# Patient Record
Sex: Female | Born: 1965
Health system: Southern US, Community
[De-identification: ages and names within clinical notes are randomized; demographics above are authoritative.]

## PROBLEM LIST (undated history)

## (undated) DIAGNOSIS — M542 Cervicalgia: Secondary | ICD-10-CM

## (undated) DIAGNOSIS — F329 Major depressive disorder, single episode, unspecified: Secondary | ICD-10-CM

## (undated) DIAGNOSIS — K589 Irritable bowel syndrome without diarrhea: Secondary | ICD-10-CM

## (undated) DIAGNOSIS — M797 Fibromyalgia: Secondary | ICD-10-CM

## (undated) DIAGNOSIS — E7800 Pure hypercholesterolemia, unspecified: Secondary | ICD-10-CM

## (undated) DIAGNOSIS — T8859XA Other complications of anesthesia, initial encounter: Secondary | ICD-10-CM

## (undated) DIAGNOSIS — G4700 Insomnia, unspecified: Secondary | ICD-10-CM

## (undated) DIAGNOSIS — G43909 Migraine, unspecified, not intractable, without status migrainosus: Secondary | ICD-10-CM

## (undated) DIAGNOSIS — F4310 Post-traumatic stress disorder, unspecified: Secondary | ICD-10-CM

## (undated) DIAGNOSIS — I10 Essential (primary) hypertension: Secondary | ICD-10-CM

## (undated) DIAGNOSIS — K219 Gastro-esophageal reflux disease without esophagitis: Secondary | ICD-10-CM

## (undated) DIAGNOSIS — M545 Low back pain, unspecified: Secondary | ICD-10-CM

## (undated) DIAGNOSIS — IMO0002 Reserved for concepts with insufficient information to code with codable children: Secondary | ICD-10-CM

## (undated) DIAGNOSIS — F40240 Claustrophobia: Secondary | ICD-10-CM

## (undated) DIAGNOSIS — Z9889 Other specified postprocedural states: Secondary | ICD-10-CM

## (undated) DIAGNOSIS — R112 Nausea with vomiting, unspecified: Secondary | ICD-10-CM

## (undated) DIAGNOSIS — R1011 Right upper quadrant pain: Secondary | ICD-10-CM

## (undated) DIAGNOSIS — F32A Depression, unspecified: Secondary | ICD-10-CM

## (undated) DIAGNOSIS — E049 Nontoxic goiter, unspecified: Secondary | ICD-10-CM

## (undated) DIAGNOSIS — T4145XA Adverse effect of unspecified anesthetic, initial encounter: Secondary | ICD-10-CM

## (undated) DIAGNOSIS — M1990 Unspecified osteoarthritis, unspecified site: Secondary | ICD-10-CM

## (undated) HISTORY — DX: Right upper quadrant pain: R10.11

## (undated) HISTORY — PX: ANKLE SURGERY: SHX546

## (undated) HISTORY — DX: Claustrophobia: F40.240

## (undated) HISTORY — PX: SHOULDER SURGERY: SHX246

## (undated) HISTORY — DX: Gastro-esophageal reflux disease without esophagitis: K21.9

## (undated) HISTORY — DX: Cervicalgia: M54.2

## (undated) HISTORY — DX: Irritable bowel syndrome without diarrhea: K58.9

## (undated) HISTORY — DX: Low back pain: M54.5

## (undated) HISTORY — DX: Reserved for concepts with insufficient information to code with codable children: IMO0002

## (undated) HISTORY — DX: Post-traumatic stress disorder, unspecified: F43.10

## (undated) HISTORY — DX: Major depressive disorder, single episode, unspecified: F32.9

## (undated) HISTORY — DX: Migraine, unspecified, not intractable, without status migrainosus: G43.909

## (undated) HISTORY — PX: TOTAL ABDOMINAL HYSTERECTOMY: SHX209

## (undated) HISTORY — PX: COLONOSCOPY: SHX174

## (undated) HISTORY — PX: REDUCTION MAMMAPLASTY: SUR839

## (undated) HISTORY — DX: Pure hypercholesterolemia, unspecified: E78.00

## (undated) HISTORY — DX: Insomnia, unspecified: G47.00

## (undated) HISTORY — DX: Depression, unspecified: F32.A

## (undated) HISTORY — PX: UPPER GASTROINTESTINAL ENDOSCOPY: SHX188

## (undated) HISTORY — DX: Nontoxic goiter, unspecified: E04.9

## (undated) HISTORY — DX: Low back pain, unspecified: M54.50

---

## 2000-11-19 DIAGNOSIS — K219 Gastro-esophageal reflux disease without esophagitis: Secondary | ICD-10-CM

## 2000-11-19 HISTORY — DX: Gastro-esophageal reflux disease without esophagitis: K21.9

## 2001-06-20 ENCOUNTER — Encounter: Payer: Self-pay | Admitting: Orthopedic Surgery

## 2001-06-20 ENCOUNTER — Ambulatory Visit (HOSPITAL_COMMUNITY): Admission: RE | Admit: 2001-06-20 | Discharge: 2001-06-20 | Payer: Self-pay | Admitting: Orthopedic Surgery

## 2002-02-18 ENCOUNTER — Encounter: Payer: Self-pay | Admitting: *Deleted

## 2002-02-18 ENCOUNTER — Emergency Department (HOSPITAL_COMMUNITY): Admission: EM | Admit: 2002-02-18 | Discharge: 2002-02-18 | Payer: Self-pay | Admitting: *Deleted

## 2002-04-29 ENCOUNTER — Ambulatory Visit (HOSPITAL_COMMUNITY): Admission: RE | Admit: 2002-04-29 | Discharge: 2002-04-29 | Payer: Self-pay | Admitting: Endocrinology

## 2002-10-23 ENCOUNTER — Encounter: Payer: Self-pay | Admitting: Orthopedic Surgery

## 2002-10-23 ENCOUNTER — Encounter: Admission: RE | Admit: 2002-10-23 | Discharge: 2002-10-23 | Payer: Self-pay | Admitting: Orthopedic Surgery

## 2003-11-04 ENCOUNTER — Encounter: Payer: Self-pay | Admitting: Orthopedic Surgery

## 2003-11-20 HISTORY — PX: LUMBAR DISC SURGERY: SHX700

## 2004-01-18 ENCOUNTER — Encounter: Payer: Self-pay | Admitting: Orthopedic Surgery

## 2004-01-18 ENCOUNTER — Ambulatory Visit (HOSPITAL_COMMUNITY): Admission: RE | Admit: 2004-01-18 | Discharge: 2004-01-18 | Payer: Self-pay | Admitting: Orthopedic Surgery

## 2004-04-24 ENCOUNTER — Encounter: Admission: RE | Admit: 2004-04-24 | Discharge: 2004-04-24 | Payer: Self-pay | Admitting: Orthopedic Surgery

## 2004-05-08 ENCOUNTER — Encounter: Admission: RE | Admit: 2004-05-08 | Discharge: 2004-05-08 | Payer: Self-pay | Admitting: Orthopedic Surgery

## 2004-06-07 ENCOUNTER — Encounter: Admission: RE | Admit: 2004-06-07 | Discharge: 2004-06-07 | Payer: Self-pay | Admitting: Orthopedic Surgery

## 2004-06-21 ENCOUNTER — Encounter: Admission: RE | Admit: 2004-06-21 | Discharge: 2004-06-21 | Payer: Self-pay | Admitting: Neurosurgery

## 2004-07-17 ENCOUNTER — Ambulatory Visit (HOSPITAL_COMMUNITY): Admission: RE | Admit: 2004-07-17 | Discharge: 2004-07-18 | Payer: Self-pay | Admitting: Neurosurgery

## 2004-09-15 ENCOUNTER — Encounter: Admission: RE | Admit: 2004-09-15 | Discharge: 2004-09-15 | Payer: Self-pay | Admitting: Neurosurgery

## 2004-11-19 HISTORY — PX: BREAST REDUCTION SURGERY: SHX8

## 2004-11-23 ENCOUNTER — Ambulatory Visit: Payer: Self-pay | Admitting: Cardiology

## 2004-12-25 ENCOUNTER — Encounter: Admission: RE | Admit: 2004-12-25 | Discharge: 2004-12-25 | Payer: Self-pay | Admitting: Neurosurgery

## 2005-01-10 ENCOUNTER — Ambulatory Visit: Payer: Self-pay | Admitting: Orthopedic Surgery

## 2005-02-21 ENCOUNTER — Ambulatory Visit: Payer: Self-pay | Admitting: Orthopedic Surgery

## 2005-02-28 ENCOUNTER — Ambulatory Visit: Payer: Self-pay | Admitting: Cardiology

## 2005-03-14 ENCOUNTER — Ambulatory Visit: Payer: Self-pay | Admitting: Cardiology

## 2005-04-06 ENCOUNTER — Ambulatory Visit: Payer: Self-pay | Admitting: Cardiology

## 2005-05-28 ENCOUNTER — Ambulatory Visit: Payer: Self-pay | Admitting: Orthopedic Surgery

## 2005-06-01 ENCOUNTER — Ambulatory Visit: Payer: Self-pay | Admitting: Orthopedic Surgery

## 2005-06-01 ENCOUNTER — Ambulatory Visit (HOSPITAL_COMMUNITY): Admission: RE | Admit: 2005-06-01 | Discharge: 2005-06-01 | Payer: Self-pay | Admitting: Orthopedic Surgery

## 2005-06-04 ENCOUNTER — Ambulatory Visit: Payer: Self-pay | Admitting: Orthopedic Surgery

## 2005-06-11 ENCOUNTER — Ambulatory Visit: Payer: Self-pay | Admitting: Orthopedic Surgery

## 2005-06-25 ENCOUNTER — Ambulatory Visit: Payer: Self-pay | Admitting: Orthopedic Surgery

## 2005-07-18 ENCOUNTER — Ambulatory Visit: Payer: Self-pay | Admitting: Cardiology

## 2005-07-30 ENCOUNTER — Ambulatory Visit: Payer: Self-pay | Admitting: Orthopedic Surgery

## 2005-09-03 ENCOUNTER — Ambulatory Visit: Payer: Self-pay | Admitting: Orthopedic Surgery

## 2005-11-01 ENCOUNTER — Ambulatory Visit: Payer: Self-pay | Admitting: Psychology

## 2005-12-12 ENCOUNTER — Ambulatory Visit: Payer: Self-pay | Admitting: Orthopedic Surgery

## 2005-12-14 ENCOUNTER — Ambulatory Visit: Payer: Self-pay | Admitting: Psychology

## 2006-03-14 ENCOUNTER — Ambulatory Visit: Payer: Self-pay | Admitting: Orthopedic Surgery

## 2006-04-26 ENCOUNTER — Ambulatory Visit (HOSPITAL_COMMUNITY): Payer: Self-pay | Admitting: Psychology

## 2006-05-10 ENCOUNTER — Ambulatory Visit (HOSPITAL_COMMUNITY): Payer: Self-pay | Admitting: Psychology

## 2006-06-13 ENCOUNTER — Ambulatory Visit: Payer: Self-pay | Admitting: Orthopedic Surgery

## 2006-06-26 ENCOUNTER — Ambulatory Visit (HOSPITAL_COMMUNITY): Payer: Self-pay | Admitting: Psychology

## 2006-07-15 ENCOUNTER — Ambulatory Visit (HOSPITAL_COMMUNITY): Payer: Self-pay | Admitting: Psychology

## 2006-07-18 ENCOUNTER — Ambulatory Visit (HOSPITAL_COMMUNITY): Payer: Self-pay | Admitting: Psychology

## 2006-08-02 ENCOUNTER — Ambulatory Visit (HOSPITAL_COMMUNITY): Payer: Self-pay | Admitting: Psychology

## 2006-08-28 ENCOUNTER — Ambulatory Visit (HOSPITAL_COMMUNITY): Payer: Self-pay | Admitting: Psychology

## 2006-09-18 ENCOUNTER — Ambulatory Visit (HOSPITAL_COMMUNITY): Payer: Self-pay | Admitting: Psychology

## 2006-10-08 ENCOUNTER — Ambulatory Visit (HOSPITAL_COMMUNITY): Payer: Self-pay | Admitting: Psychology

## 2006-11-07 ENCOUNTER — Ambulatory Visit (HOSPITAL_COMMUNITY): Payer: Self-pay | Admitting: Psychology

## 2006-11-19 DIAGNOSIS — K589 Irritable bowel syndrome without diarrhea: Secondary | ICD-10-CM

## 2006-11-19 HISTORY — DX: Irritable bowel syndrome, unspecified: K58.9

## 2006-11-25 ENCOUNTER — Ambulatory Visit: Payer: Self-pay | Admitting: Orthopedic Surgery

## 2006-12-20 ENCOUNTER — Ambulatory Visit (HOSPITAL_COMMUNITY): Payer: Self-pay | Admitting: Psychology

## 2007-01-09 ENCOUNTER — Ambulatory Visit (HOSPITAL_COMMUNITY): Payer: Self-pay | Admitting: Psychology

## 2007-01-28 ENCOUNTER — Ambulatory Visit (HOSPITAL_COMMUNITY): Payer: Self-pay | Admitting: Psychology

## 2007-02-20 ENCOUNTER — Ambulatory Visit (HOSPITAL_COMMUNITY): Payer: Self-pay | Admitting: Psychology

## 2007-02-27 ENCOUNTER — Ambulatory Visit: Payer: Self-pay | Admitting: Orthopedic Surgery

## 2007-03-21 ENCOUNTER — Ambulatory Visit (HOSPITAL_COMMUNITY): Payer: Self-pay | Admitting: Psychology

## 2007-03-24 ENCOUNTER — Ambulatory Visit: Payer: Self-pay | Admitting: Cardiology

## 2007-03-31 ENCOUNTER — Ambulatory Visit: Payer: Self-pay | Admitting: Internal Medicine

## 2007-04-04 ENCOUNTER — Ambulatory Visit: Payer: Self-pay | Admitting: Internal Medicine

## 2007-04-04 ENCOUNTER — Ambulatory Visit (HOSPITAL_COMMUNITY): Admission: RE | Admit: 2007-04-04 | Discharge: 2007-04-04 | Payer: Self-pay | Admitting: Internal Medicine

## 2007-04-10 ENCOUNTER — Ambulatory Visit (HOSPITAL_COMMUNITY): Payer: Self-pay | Admitting: Psychology

## 2007-04-22 ENCOUNTER — Ambulatory Visit: Payer: Self-pay | Admitting: Cardiology

## 2007-04-29 ENCOUNTER — Ambulatory Visit (HOSPITAL_COMMUNITY): Payer: Self-pay | Admitting: Psychology

## 2007-05-01 ENCOUNTER — Ambulatory Visit: Payer: Self-pay | Admitting: Orthopedic Surgery

## 2007-05-08 ENCOUNTER — Ambulatory Visit: Payer: Self-pay | Admitting: Cardiology

## 2007-05-21 ENCOUNTER — Ambulatory Visit: Payer: Self-pay | Admitting: Gastroenterology

## 2007-05-21 ENCOUNTER — Ambulatory Visit (HOSPITAL_COMMUNITY): Payer: Self-pay | Admitting: Psychology

## 2007-06-05 ENCOUNTER — Ambulatory Visit: Payer: Self-pay | Admitting: Internal Medicine

## 2007-06-06 ENCOUNTER — Ambulatory Visit (HOSPITAL_COMMUNITY): Payer: Self-pay | Admitting: Psychology

## 2007-06-16 ENCOUNTER — Ambulatory Visit: Payer: Self-pay | Admitting: Internal Medicine

## 2007-07-01 ENCOUNTER — Ambulatory Visit: Payer: Self-pay | Admitting: Urgent Care

## 2007-07-07 ENCOUNTER — Ambulatory Visit (HOSPITAL_COMMUNITY): Payer: Self-pay | Admitting: Psychology

## 2007-07-08 ENCOUNTER — Encounter (HOSPITAL_COMMUNITY): Admission: RE | Admit: 2007-07-08 | Discharge: 2007-08-07 | Payer: Self-pay | Admitting: Gastroenterology

## 2007-07-24 ENCOUNTER — Ambulatory Visit (HOSPITAL_COMMUNITY): Payer: Self-pay | Admitting: Psychology

## 2007-08-01 ENCOUNTER — Ambulatory Visit: Payer: Self-pay | Admitting: Internal Medicine

## 2007-08-07 ENCOUNTER — Ambulatory Visit (HOSPITAL_COMMUNITY): Payer: Self-pay | Admitting: Psychology

## 2007-08-14 ENCOUNTER — Ambulatory Visit: Payer: Self-pay | Admitting: Orthopedic Surgery

## 2007-09-05 ENCOUNTER — Ambulatory Visit (HOSPITAL_COMMUNITY): Payer: Self-pay | Admitting: Psychology

## 2007-11-06 ENCOUNTER — Ambulatory Visit: Payer: Self-pay | Admitting: Internal Medicine

## 2007-11-06 ENCOUNTER — Ambulatory Visit (HOSPITAL_COMMUNITY): Payer: Self-pay | Admitting: Psychology

## 2007-11-27 ENCOUNTER — Ambulatory Visit (HOSPITAL_COMMUNITY): Payer: Self-pay | Admitting: Psychology

## 2007-12-10 ENCOUNTER — Ambulatory Visit (HOSPITAL_COMMUNITY): Payer: Self-pay | Admitting: Psychology

## 2007-12-17 ENCOUNTER — Ambulatory Visit (HOSPITAL_COMMUNITY): Payer: Self-pay | Admitting: Psychology

## 2007-12-26 ENCOUNTER — Ambulatory Visit (HOSPITAL_COMMUNITY): Payer: Self-pay | Admitting: Psychology

## 2008-01-05 ENCOUNTER — Ambulatory Visit: Payer: Self-pay | Admitting: Orthopedic Surgery

## 2008-01-05 DIAGNOSIS — M25569 Pain in unspecified knee: Secondary | ICD-10-CM | POA: Insufficient documentation

## 2008-01-05 DIAGNOSIS — M549 Dorsalgia, unspecified: Secondary | ICD-10-CM | POA: Insufficient documentation

## 2008-01-16 ENCOUNTER — Ambulatory Visit (HOSPITAL_COMMUNITY): Payer: Self-pay | Admitting: Psychology

## 2008-02-12 ENCOUNTER — Ambulatory Visit (HOSPITAL_COMMUNITY): Payer: Self-pay | Admitting: Psychology

## 2008-02-19 ENCOUNTER — Telehealth: Payer: Self-pay | Admitting: Orthopedic Surgery

## 2008-02-25 ENCOUNTER — Ambulatory Visit (HOSPITAL_COMMUNITY): Payer: Self-pay | Admitting: Psychology

## 2008-03-12 ENCOUNTER — Ambulatory Visit (HOSPITAL_COMMUNITY): Payer: Self-pay | Admitting: Psychology

## 2008-03-23 ENCOUNTER — Ambulatory Visit (HOSPITAL_COMMUNITY): Payer: Self-pay | Admitting: Psychology

## 2008-04-01 ENCOUNTER — Ambulatory Visit: Payer: Self-pay | Admitting: Orthopedic Surgery

## 2008-04-01 DIAGNOSIS — M24469 Recurrent dislocation, unspecified knee: Secondary | ICD-10-CM | POA: Insufficient documentation

## 2008-04-06 ENCOUNTER — Ambulatory Visit (HOSPITAL_COMMUNITY): Payer: Self-pay | Admitting: Psychology

## 2008-04-07 ENCOUNTER — Encounter: Payer: Self-pay | Admitting: Orthopedic Surgery

## 2008-04-27 ENCOUNTER — Ambulatory Visit (HOSPITAL_COMMUNITY): Payer: Self-pay | Admitting: Psychology

## 2008-06-04 ENCOUNTER — Encounter: Payer: Self-pay | Admitting: Orthopedic Surgery

## 2008-06-25 ENCOUNTER — Encounter: Payer: Self-pay | Admitting: Orthopedic Surgery

## 2008-07-01 ENCOUNTER — Ambulatory Visit: Payer: Self-pay | Admitting: Orthopedic Surgery

## 2008-07-30 ENCOUNTER — Ambulatory Visit (HOSPITAL_COMMUNITY): Payer: Self-pay | Admitting: Psychology

## 2008-08-18 ENCOUNTER — Telehealth: Payer: Self-pay | Admitting: Orthopedic Surgery

## 2008-08-26 ENCOUNTER — Ambulatory Visit (HOSPITAL_COMMUNITY): Payer: Self-pay | Admitting: Psychology

## 2008-09-16 ENCOUNTER — Ambulatory Visit (HOSPITAL_COMMUNITY): Payer: Self-pay | Admitting: Psychology

## 2008-10-07 ENCOUNTER — Ambulatory Visit: Payer: Self-pay | Admitting: Orthopedic Surgery

## 2008-10-21 DIAGNOSIS — K219 Gastro-esophageal reflux disease without esophagitis: Secondary | ICD-10-CM | POA: Insufficient documentation

## 2008-10-21 DIAGNOSIS — R002 Palpitations: Secondary | ICD-10-CM

## 2008-10-21 DIAGNOSIS — R011 Cardiac murmur, unspecified: Secondary | ICD-10-CM

## 2008-10-28 ENCOUNTER — Ambulatory Visit (HOSPITAL_COMMUNITY): Payer: Self-pay | Admitting: Psychology

## 2008-11-23 ENCOUNTER — Ambulatory Visit (HOSPITAL_COMMUNITY): Payer: Self-pay | Admitting: Psychology

## 2008-12-13 ENCOUNTER — Ambulatory Visit (HOSPITAL_COMMUNITY): Payer: Self-pay | Admitting: Psychology

## 2008-12-31 ENCOUNTER — Ambulatory Visit (HOSPITAL_COMMUNITY): Payer: Self-pay | Admitting: Psychology

## 2009-01-20 ENCOUNTER — Ambulatory Visit (HOSPITAL_COMMUNITY): Payer: Self-pay | Admitting: Psychology

## 2009-02-02 ENCOUNTER — Ambulatory Visit (HOSPITAL_COMMUNITY): Payer: Self-pay | Admitting: Psychology

## 2009-02-04 ENCOUNTER — Other Ambulatory Visit: Payer: Self-pay | Admitting: Family Medicine

## 2009-02-04 ENCOUNTER — Ambulatory Visit: Payer: Self-pay | Admitting: *Deleted

## 2009-02-04 ENCOUNTER — Inpatient Hospital Stay (HOSPITAL_COMMUNITY): Admission: RE | Admit: 2009-02-04 | Discharge: 2009-02-07 | Payer: Self-pay | Admitting: *Deleted

## 2009-02-08 ENCOUNTER — Ambulatory Visit (HOSPITAL_COMMUNITY): Payer: Self-pay | Admitting: Psychology

## 2009-02-23 ENCOUNTER — Ambulatory Visit (HOSPITAL_COMMUNITY): Payer: Self-pay | Admitting: Psychology

## 2009-03-08 ENCOUNTER — Ambulatory Visit (HOSPITAL_COMMUNITY): Payer: Self-pay | Admitting: Psychiatry

## 2009-03-14 ENCOUNTER — Ambulatory Visit (HOSPITAL_COMMUNITY): Payer: Self-pay | Admitting: Psychology

## 2009-03-31 ENCOUNTER — Ambulatory Visit (HOSPITAL_COMMUNITY): Payer: Self-pay | Admitting: Psychology

## 2009-04-07 ENCOUNTER — Ambulatory Visit (HOSPITAL_COMMUNITY): Payer: Self-pay | Admitting: Psychiatry

## 2009-04-20 ENCOUNTER — Ambulatory Visit (HOSPITAL_COMMUNITY): Payer: Self-pay | Admitting: Psychology

## 2009-05-03 ENCOUNTER — Ambulatory Visit (HOSPITAL_COMMUNITY): Payer: Self-pay | Admitting: Psychiatry

## 2009-05-10 ENCOUNTER — Ambulatory Visit (HOSPITAL_COMMUNITY): Payer: Self-pay | Admitting: Psychology

## 2009-06-07 ENCOUNTER — Ambulatory Visit (HOSPITAL_COMMUNITY): Payer: Self-pay | Admitting: Psychiatry

## 2009-06-10 ENCOUNTER — Ambulatory Visit (HOSPITAL_COMMUNITY): Payer: Self-pay | Admitting: Psychology

## 2009-06-30 ENCOUNTER — Ambulatory Visit (HOSPITAL_COMMUNITY): Payer: Self-pay | Admitting: Psychology

## 2009-08-02 ENCOUNTER — Ambulatory Visit (HOSPITAL_COMMUNITY): Payer: Self-pay | Admitting: Psychiatry

## 2009-08-04 ENCOUNTER — Ambulatory Visit (HOSPITAL_COMMUNITY): Payer: Self-pay | Admitting: Psychology

## 2009-09-15 ENCOUNTER — Ambulatory Visit (HOSPITAL_COMMUNITY): Payer: Self-pay | Admitting: Psychology

## 2009-09-29 ENCOUNTER — Ambulatory Visit (HOSPITAL_COMMUNITY): Payer: Self-pay | Admitting: Psychiatry

## 2009-10-04 ENCOUNTER — Ambulatory Visit (HOSPITAL_COMMUNITY): Payer: Self-pay | Admitting: Psychology

## 2009-10-25 ENCOUNTER — Ambulatory Visit (HOSPITAL_COMMUNITY): Payer: Self-pay | Admitting: Psychology

## 2009-12-06 ENCOUNTER — Ambulatory Visit (HOSPITAL_COMMUNITY): Payer: Self-pay | Admitting: Psychology

## 2009-12-12 ENCOUNTER — Ambulatory Visit (HOSPITAL_COMMUNITY): Payer: Self-pay | Admitting: Psychology

## 2009-12-22 ENCOUNTER — Ambulatory Visit (HOSPITAL_COMMUNITY): Payer: Self-pay | Admitting: Psychiatry

## 2009-12-28 ENCOUNTER — Ambulatory Visit (HOSPITAL_COMMUNITY): Admission: RE | Admit: 2009-12-28 | Discharge: 2009-12-28 | Payer: Self-pay | Admitting: General Surgery

## 2010-02-16 ENCOUNTER — Ambulatory Visit (HOSPITAL_COMMUNITY): Payer: Self-pay | Admitting: Psychiatry

## 2010-02-17 ENCOUNTER — Ambulatory Visit (HOSPITAL_COMMUNITY): Payer: Self-pay | Admitting: Psychology

## 2010-06-13 ENCOUNTER — Ambulatory Visit (HOSPITAL_COMMUNITY): Payer: Self-pay | Admitting: Psychiatry

## 2010-06-14 ENCOUNTER — Ambulatory Visit (HOSPITAL_COMMUNITY): Payer: Self-pay | Admitting: Psychology

## 2010-07-06 ENCOUNTER — Ambulatory Visit (HOSPITAL_COMMUNITY): Payer: Self-pay | Admitting: Psychology

## 2010-07-13 ENCOUNTER — Ambulatory Visit (HOSPITAL_COMMUNITY): Payer: Self-pay | Admitting: Psychiatry

## 2010-07-26 ENCOUNTER — Ambulatory Visit (HOSPITAL_COMMUNITY): Payer: Self-pay | Admitting: Psychology

## 2010-08-17 ENCOUNTER — Ambulatory Visit (HOSPITAL_COMMUNITY): Payer: Self-pay | Admitting: Psychology

## 2010-08-22 ENCOUNTER — Ambulatory Visit (HOSPITAL_COMMUNITY): Payer: Self-pay | Admitting: Psychiatry

## 2010-09-01 ENCOUNTER — Ambulatory Visit (HOSPITAL_COMMUNITY): Payer: Self-pay | Admitting: Psychology

## 2010-09-18 ENCOUNTER — Ambulatory Visit (HOSPITAL_COMMUNITY): Payer: Self-pay | Admitting: Psychology

## 2010-10-09 ENCOUNTER — Ambulatory Visit (HOSPITAL_COMMUNITY): Payer: Self-pay | Admitting: Psychology

## 2010-10-19 ENCOUNTER — Ambulatory Visit (HOSPITAL_COMMUNITY): Payer: Self-pay | Admitting: Psychiatry

## 2010-10-23 ENCOUNTER — Ambulatory Visit (HOSPITAL_COMMUNITY): Payer: Self-pay | Admitting: Psychology

## 2010-11-02 ENCOUNTER — Encounter
Admission: RE | Admit: 2010-11-02 | Discharge: 2010-11-02 | Payer: Self-pay | Source: Home / Self Care | Attending: Anesthesiology | Admitting: Anesthesiology

## 2010-11-14 ENCOUNTER — Ambulatory Visit (HOSPITAL_COMMUNITY): Payer: Self-pay | Admitting: Psychology

## 2010-12-05 ENCOUNTER — Ambulatory Visit (HOSPITAL_COMMUNITY)
Admission: RE | Admit: 2010-12-05 | Discharge: 2010-12-05 | Payer: Self-pay | Source: Home / Self Care | Attending: Psychology | Admitting: Psychology

## 2010-12-09 ENCOUNTER — Encounter: Payer: Self-pay | Admitting: Neurosurgery

## 2010-12-10 ENCOUNTER — Encounter: Payer: Self-pay | Admitting: Anesthesiology

## 2010-12-12 ENCOUNTER — Ambulatory Visit
Admission: RE | Admit: 2010-12-12 | Discharge: 2010-12-12 | Payer: Self-pay | Source: Home / Self Care | Attending: Internal Medicine | Admitting: Internal Medicine

## 2010-12-12 ENCOUNTER — Ambulatory Visit (HOSPITAL_COMMUNITY)
Admission: RE | Admit: 2010-12-12 | Discharge: 2010-12-12 | Payer: Self-pay | Source: Home / Self Care | Attending: Psychiatry | Admitting: Psychiatry

## 2010-12-12 ENCOUNTER — Encounter: Payer: Self-pay | Admitting: Internal Medicine

## 2010-12-12 DIAGNOSIS — R5383 Other fatigue: Secondary | ICD-10-CM

## 2010-12-12 DIAGNOSIS — R0989 Other specified symptoms and signs involving the circulatory and respiratory systems: Secondary | ICD-10-CM

## 2010-12-12 DIAGNOSIS — R5381 Other malaise: Secondary | ICD-10-CM | POA: Insufficient documentation

## 2010-12-12 DIAGNOSIS — R0609 Other forms of dyspnea: Secondary | ICD-10-CM | POA: Insufficient documentation

## 2010-12-18 ENCOUNTER — Encounter: Payer: Self-pay | Admitting: Physician Assistant

## 2010-12-20 ENCOUNTER — Encounter: Payer: Self-pay | Admitting: Internal Medicine

## 2010-12-20 DIAGNOSIS — R002 Palpitations: Secondary | ICD-10-CM

## 2010-12-20 DIAGNOSIS — R072 Precordial pain: Secondary | ICD-10-CM

## 2010-12-21 NOTE — Assessment & Plan Note (Signed)
Summary: f3y   Visit Type:  3 year follow up  CC:  chest pain, swelling in ankles, and headaches.  History of Present Illness: Mrs. Gitto is seen for the first time since 2008. She comes in with concerns of heart disease based on her family history with her sister dying of myocarditis and her mother being diagnosed with congestive heart failure and a recent echo reportedly showed some leaky valve.    She continues to have breathlessness atypical chest pain and some peripheral edema for which she was recently started on furosemide  Her previous cardiac evaluation included CT angios that was negative in 2006; she also had mild use of Actos done around that same time were negative. Because of her palpitations she underwent a recording which was also diagnostic for no correlating arrhythmia. In addition to that she had some skips and was found to have PVCs and nonsustained ventricular tachycardia that appeared to originate from the right ventricular outflow tract. . her noncardiac related history his lower for diabetes and hypertension. She also has significant anxiety and depression.     Preventive Screening-Counseling & Management  Alcohol-Tobacco     Smoking Status: current  Problems Prior to Update: 1)  Murmur  (ICD-785.2) 2)  Palpitations  (ICD-785.1) 3)  Gerd  (ICD-530.81) 4)  Subluxation Patellar (MALALIGNMENT)  (ICD-718.36) 5)  Back Pain  (ICD-724.5) 6)  Knee Pain  (ICD-719.46)  Current Medications (verified): 1)  Diovan Hct 160-25 Mg Tabs (Valsartan-Hydrochlorothiazide) .... Once Daily 2)  Metoprolol Tartrate 100 Mg Tabs (Metoprolol Tartrate) .... Take One Tablet By Mouth Twice A Day 3)  Alprazolam 0.5 Mg Tabs (Alprazolam) .... Three Times A Day 4)  Lyrica 50 Mg Caps (Pregabalin) .... Two Times A Day 5)  Prozac 40 Mg Caps (Fluoxetine Hcl) .... Once Daily 6)  Prilosec 20 Mg Cpdr (Omeprazole) .... Once Daily 7)  Furosemide 20 Mg Tabs (Furosemide) .... Take One Tablet By Mouth  Daily. 8)  Metformin Hcl 500 Mg Tabs (Metformin Hcl) .... Once Daily 9)  Cetirizine Hcl 10 Mg Tabs (Cetirizine Hcl) .... Once Daily 10)  Vitamin D3 5000 Unit/ml Liqd (Cholecalciferol) .... Once Daily 11)  Seroquel 100 Mg Tabs (Quetiapine Fumarate) .... Once Daily 12)  Trazodone Hcl 50 Mg Tabs (Trazodone Hcl) .... Once Daily 13)  Womens Daily Formula  Tabs (Multiple Vitamins-Minerals) .... Once Daily 14)  Adipex-P 37.5 Mg Caps (Phentermine Hcl) .... Once Daily  Allergies (verified): 1)  ! Penicillin  Past History:  Past Surgical History: Last updated: 11/11/08 Breast biopsy Shoulder surgery (2) Total Abdominal Hysterectomy Foot surgery (2) Knee Arthroscopy left 03/05 Dr. Romeo Apple Knee Arthroscopy left 07/06 Dr. Romeo Apple Breast Reduction Micro-discectomy  Family History: Last updated: 2008/11/11 Father died age 76 sickle cell Sisterx1 died age 67 myocarditis Mother Hx CAD mid-30's  Past Medical History: Depression/anxiet Degenerative disc disease Fibromyalgia Knee problems Diabetes Hypertension Daytime somnolence GE reflux disease     Social History: Divorced  Tobacco Use - Yes.  Disabled  Smoking Status:  current  Review of Systems       full review of systems was negative apart from a history of present illness and past medical history.   Vital Signs:  Patient profile:   45 year old female Height:      71 inches Weight:      209.75 pounds BMI:     29.36 Pulse rate:   72 / minute BP sitting:   118 / 72  (left arm) Cuff size:   large  Vitals Entered By: Caralee Ates CMA (December 12, 2010 11:20 AM)  Physical Exam  General:  Alert and oriented middle-aged [place] American female appearing her stated age in no acute distress. HEENT  normal . Neck veins were flat; carotids brisk and full without bruits. No lymphadenopathy. Back without kyphosis. Lungs clear. Heart sounds regular without murmurs or gallops. PMI nondisplaced. Abdomen soft with active bowel  sounds without midline pulsation or hepatomegaly. Femoral pulses and distal pulses intact. Extremities were without clubbing cyanosis or edemaSkin warm and dry. Neurological exam grossly normal affect is somewhat flat   EKG  Procedure date:  12/12/2010  Findings:      sinus rhythm at 73 Intervals 0.18/0.08/0.37 Axis I Voltage criteria for left ventricular hypertrophy with repolarization abnormalities  Impression & Recommendations:  Problem # 1:  DYSPNEA ON EXERTION (ICD-786.09) the patient has significant dyspnea on exertion. His atypical chest pain. It has been about 6 years since her last evaluation for coronary disease. She has her recent ultrasound the records of which we have not yet been able to track down. We will plan to undertake a likely scan Myoview up at [facility]; given the abnormal baseline cardiogram regular stress testing will be an adequate and she has relatively high pretest probability given herlong-standing diabetes.  I note that he's not on statin therapy I will defer this to her primary care physician but this will be important  Problem # 2:  PALPITATIONS (ICD-785.1) I'm not sure what this is about; in the past she has not had significant arrhythmia correlated with her symptoms. Her updated medication list for this problem includes:    Metoprolol Tartrate 100 Mg Tabs (Metoprolol tartrate) .Marland Kitchen... Take one tablet by mouth twice a day  Orders: Nuclear Stress Test (Nuc Stress Test) Holter (Holter)  Problem # 3:  FATIGUE (ICD-780.79) given her very debilitating fatigue, I thought we would decrease the metoprolol saw him and see if that helps. She also has significant snoring and excluding sleep apnea I think is important. It is likely that her neurolepticscontribute to her fatigue and somnolence. SHe is to see her prescribing physician today.  Patient Instructions: 1)  Your physician recommends that you continue on your current medications as directed. Please  refer to the Current Medication list given to you today. 2)  Your physician has recommended that you wear a holter monitor-nightwatch for 2 days.  Holter monitors are medical devices that record the heart's electrical activity. Doctors most often use these monitors to diagnose arrhythmias. Arrhythmias are problems with the speed or rhythm of the heartbeat. The monitor is a small, portable device. You can wear one while you do your normal daily activities. This is usually used to diagnose what is causing palpitations/syncope (passing out). 3)  Your physician has requested that you have an [employer]  For further information please visit [email or web address]  Please follow instruction sheet, as given.

## 2011-01-01 ENCOUNTER — Encounter (HOSPITAL_COMMUNITY): Payer: Medicare Other | Admitting: Psychology

## 2011-01-09 ENCOUNTER — Encounter (INDEPENDENT_AMBULATORY_CARE_PROVIDER_SITE_OTHER): Payer: Medicare Other | Admitting: Psychology

## 2011-01-09 ENCOUNTER — Telehealth (INDEPENDENT_AMBULATORY_CARE_PROVIDER_SITE_OTHER): Payer: Self-pay | Admitting: *Deleted

## 2011-01-09 DIAGNOSIS — F332 Major depressive disorder, recurrent severe without psychotic features: Secondary | ICD-10-CM

## 2011-01-16 NOTE — Progress Notes (Signed)
  Phone Note Outgoing Call   Call placed by: Scherrie Bateman, LPN,  January 09, 2011 1:40 PM Call placed to: Patient Summary of Call: CALL PLACED TO PT RE:  SLEEP STUDYWAS NEG  AND STRESS  TEST THAT WAS DONE  ON 2.1.12 AT [facility] PER DR Graciela Husbands NORM STUDY PT  NEEDS TO F/U WITH PMD RE  DYSPNEA NOT HEART RELATED.PT AWARE./CY Initial call taken by: Scherrie Bateman, LPN,  January 09, 2011 1:44 PM

## 2011-02-06 ENCOUNTER — Encounter (INDEPENDENT_AMBULATORY_CARE_PROVIDER_SITE_OTHER): Payer: Medicare Other | Admitting: Psychiatry

## 2011-02-06 DIAGNOSIS — F331 Major depressive disorder, recurrent, moderate: Secondary | ICD-10-CM

## 2011-02-07 ENCOUNTER — Encounter (HOSPITAL_COMMUNITY): Payer: Medicare Other | Admitting: Psychology

## 2011-02-07 LAB — BASIC METABOLIC PANEL
BUN: 7 mg/dL (ref 6–23)
CO2: 27 mEq/L (ref 19–32)
Calcium: 9.3 mg/dL (ref 8.4–10.5)
Chloride: 101 mEq/L (ref 96–112)
Creatinine, Ser: 0.74 mg/dL (ref 0.4–1.2)
GFR calc Af Amer: >60 mL/min (ref >60)

## 2011-02-07 LAB — CBC
MCHC: 33.8 g/dL (ref 30.0–36.0)
MCV: 95 fL (ref 78.0–100.0)
Platelets: 303 10*3/uL (ref 150–400)
RDW: 12.5 % (ref 11.5–15.5)

## 2011-02-07 LAB — GLUCOSE, CAPILLARY: Glucose-Capillary: 130 mg/dL — ABNORMAL HIGH (ref 70–99)

## 2011-03-01 ENCOUNTER — Encounter (HOSPITAL_COMMUNITY): Payer: Medicare Other | Admitting: Psychology

## 2011-03-01 LAB — RAPID URINE DRUG SCREEN, HOSP PERFORMED
Barbiturates: NOT DETECTED
Benzodiazepines: POSITIVE — AB
Opiates: NOT DETECTED

## 2011-03-01 LAB — BASIC METABOLIC PANEL
BUN: 7 mg/dL (ref 6–23)
Calcium: 9.9 mg/dL (ref 8.4–10.5)
Creatinine, Ser: 0.77 mg/dL (ref 0.4–1.2)
GFR calc non Af Amer: >60 mL/min (ref >60)
Glucose, Bld: 108 mg/dL — ABNORMAL HIGH (ref 70–99)
Potassium: 3.3 mEq/L — ABNORMAL LOW (ref 3.5–5.1)

## 2011-03-01 LAB — CBC
HCT: 50.2 % — ABNORMAL HIGH (ref 36.0–46.0)
Platelets: 310 10*3/uL (ref 150–400)
RDW: 13 % (ref 11.5–15.5)
WBC: 7.7 10*3/uL (ref 4.0–10.5)

## 2011-03-01 LAB — DIFFERENTIAL
Basophils Absolute: 0 10*3/uL (ref 0.0–0.1)
Eosinophils Relative: 0 % (ref 0–5)
Lymphocytes Relative: 24 % (ref 12–46)
Neutrophils Relative %: 70 % (ref 43–77)

## 2011-03-01 LAB — ETHANOL: Alcohol, Ethyl (B): <5 mg/dL (ref 0–10)

## 2011-03-05 ENCOUNTER — Encounter (HOSPITAL_COMMUNITY): Payer: Medicare Other | Admitting: Psychology

## 2011-03-20 ENCOUNTER — Encounter (INDEPENDENT_AMBULATORY_CARE_PROVIDER_SITE_OTHER): Payer: Medicare Other | Admitting: Psychiatry

## 2011-03-20 DIAGNOSIS — F331 Major depressive disorder, recurrent, moderate: Secondary | ICD-10-CM

## 2011-03-22 ENCOUNTER — Ambulatory Visit: Payer: Medicare Other | Admitting: Gastroenterology

## 2011-03-26 ENCOUNTER — Encounter (INDEPENDENT_AMBULATORY_CARE_PROVIDER_SITE_OTHER): Payer: Medicare Other | Admitting: Psychology

## 2011-03-26 DIAGNOSIS — F332 Major depressive disorder, recurrent severe without psychotic features: Secondary | ICD-10-CM

## 2011-03-29 ENCOUNTER — Ambulatory Visit (INDEPENDENT_AMBULATORY_CARE_PROVIDER_SITE_OTHER): Payer: Medicare Other | Admitting: Gastroenterology

## 2011-03-29 ENCOUNTER — Telehealth: Payer: Self-pay

## 2011-03-29 ENCOUNTER — Encounter: Payer: Self-pay | Admitting: Gastroenterology

## 2011-03-29 VITALS — BP 140/92 | HR 83 | Temp 97.5°F | Ht 71.0 in | Wt 214.0 lb

## 2011-03-29 DIAGNOSIS — K589 Irritable bowel syndrome without diarrhea: Secondary | ICD-10-CM

## 2011-03-29 MED ORDER — HYOSCYAMINE SULFATE 0.125 MG PO TABS: ORAL_TABLET | ORAL | Status: DC

## 2011-03-29 NOTE — Telephone Encounter (Signed)
Called Rx to [facility].

## 2011-03-29 NOTE — Progress Notes (Signed)
Pt is aware of her OV in 3 months

## 2011-03-29 NOTE — Patient Instructions (Signed)
Take Levsin 30 minutes prior to meals. Continue weight loss efforts and exercise. Add probiotic daily [facility] [employer], or [employer] brand) Follow up in 3 mos.  Irritable Bowel Syndrome (Spastic Colon) Irritable Bowel Syndrome (IBS) is caused by a disturbance of normal bowel function. Other terms used are spastic colon, mucous colitis, and irritable colon. It does not require surgery, nor does it lead to cancer. There is no cure for IBS. But with proper diet, stress reduction, and medication, you will find that your problems (symptoms) will gradually disappear or improve. IBS is a common digestive disorder. It usually appears in late adolescence or early adulthood. Women develop it twice as often as men. CAUSES After food has been digested and absorbed in the small intestine, waste material is moved into the colon (large intestine). In the colon, water and salts are absorbed from the undigested products coming from the small intestine. The remaining residue, or fecal material, is held for elimination. Under normal circumstances, gentle, rhythmic contractions on the bowel walls push the fecal material along the colon towards the rectum. In IBS, however, these contractions are irregular and poorly coordinated. The fecal material is either retained too long, resulting in constipation, or expelled too soon, producing diarrhea. SYMPTOMS  The most common symptom of IBS is pain. It is typically in the lower left side of the belly (abdomen). But it may occur anywhere in the abdomen. It can be felt as heartburn, backache, or even as a dull pain in the arms or shoulders. The pain comes from excessive bowel-muscle spasms and from the buildup of gas and fecal material in the colon. This pain:  Can range from sharp belly (abdominal) cramps to a dull, continuous ache.   Usually worsens soon after eating.   Is typically relieved by having a bowel movement or passing gas.  Abdominal pain is usually  accompanied by constipation. But it may also produce diarrhea. The diarrhea typically occurs right after a meal or upon arising in the morning. The stools are typically soft and watery. They are often flecked with secretions (mucus). Other symptoms of IBS include:  Bloating.  Loss of appetite.   Heartburn.  Feeling sick to your stomach  (nausea).   Belching  Vomiting   Gas.  IBS may also cause a number of symptoms that are unrelated to the digestive system:  Fatigue.  Headaches.   Anxiety  Shortness of breath   Difficulty in concentrating.  Dizziness.   These symptoms tend to come and go. DIAGNOSIS The symptoms of IBS closely mimic the symptoms of other, more serious digestive disorders. So your caregiver may wish to perform a variety of additional tests to exclude these disorders. He/she wants to be certain of learning what is wrong (diagnosis). The nature and purpose of each test will be explained to you. TREATMENT A number of medications are available to help correct bowel function and/or relieve bowel spasms and abdominal pain. Among the drugs available are:  Mild, non-irritating laxatives for severe constipation and to help restore normal bowel habits.   Specific anti-diarrheal medications to treat severe or prolonged diarrhea.   Anti-spasmodic agents to relieve intestinal cramps.   Your caregiver may also decide to treat you with a mild tranquilizer or sedative during unusually stressful periods in your life.  The important thing to remember is that if any drug is prescribed for you, make sure that you take it exactly as directed. Make sure that your caregiver knows how well it worked for you.  HOME CARE INSTRUCTIONS   Avoid foods that are high in fat or oils. Some examples EAV:WUJWJ cream, butter, frankfurters, sausage, and other fatty meats.   Avoid foods that have a laxative effect, such as fruit, fruit juice, and dairy products.   Cut out carbonated drinks,  chewing gum, and "gassy" foods, such as beans and cabbage. This may help relieve bloating and belching.   Bran taken with plenty of liquids may help relieve constipation.   Keep track of what foods seem to trigger your symptoms.   Avoid emotionally charged situations or circumstances that produce anxiety.   Start or continue exercising.   Get plenty of rest and sleep.  MAKE SURE YOU:   Understand these instructions.   Will watch your condition.   Will get help right away if you are not doing well or get worse.  Document Released: 11/05/2005 Document Re-Released: 03/24/2009 [facility] Patient Information 2011 [place], [place].

## 2011-03-29 NOTE — Assessment & Plan Note (Signed)
Sx not ideally controlled. No warning S/Sx.  Restart anticholinergic. Add probiotic. Monitor diet. Read info on IBS. HO given. Follow up in 3 mos.

## 2011-03-29 NOTE — Telephone Encounter (Signed)
Please call in Dicyclomine 10 mg 1-2 po 30 minutes prior to meals and at bedtime, #120, [id number].

## 2011-03-29 NOTE — Progress Notes (Signed)
Subjective:    Patient ID: [name], female    DOB: 01-Feb-1966, 45 y.o.   MRN: [id number]  PCP: [facility]  HPI Dx: IBS 2008. Having diarrhea and never had constipation. Bms: nl at least 4 per day, soft to watery, green or brown. Never black, red, or white. Pain: 3 times/d, lasts for about an hour. usu in BR immediately after eating and stays in BR for 30 mins. In middle/upper abd and some days bloating. Pain better after passing gas or w/ Bms. Afraid because can't tell gas from Bms. Soda: no sweet Tea.: no. Gatorade or water. No fever, but breaks out in a cold sweat. Sometimes gets nausea so severe that she throws up. Feels better after Bms/vomiting. Meds tried: Levbid, Hyomax helped. Weight loss: no. Mother was sick and got down to 140 lbs. NO MILK Or ICE CREAM. Rare cheese. Pain in mid-epigastrium always with IBS.  Past Medical History  Diagnosis Date  . Depression   . Diabetes mellitus   . LBP (low back pain)   . BMI (body mass index) 20.0-29.9 2008 192 lbs  . GERD (gastroesophageal reflux disease) 2002    202 lbs  . Irritable bowel syndrome 2008    diarrhea predominant MEDS TRIED: BENTYL, LEVBID  . RUQ abdominal pain 2004 HIDA NL CTA w/ IVC-FATTY LIVER, ANGIOMYOLIPOMA LEFT KIDNEY    2008 HIDA NL   Past Surgical History  Procedure Date  . Total abdominal hysterectomy 2000 fibroids/DUB  . Shoulder surgery 1997 RIGHT  . Breast reduction surgery 2006  . Lumbar disc surgery 2005  . Ankle surgery RIGHT  . Colonoscopy 2008 RMR    PAN-COLONIC TICS, NL TI  . Upper gastrointestinal endoscopy 2008 RMR    SML HH o/w NL    Allergies  Allergen Reactions  . Penicillins     Current Outpatient Prescriptions  Medication Sig Dispense Refill  . ALPRAZolam (XANAX) 0.5 MG tablet Take 0.5 mg by mouth at bedtime as needed.        . cetirizine (ZYRTEC) 10 MG tablet Take 10 mg by mouth daily.        Marland Kitchen DIOVAN HCT 160-25 MG per tablet       . FLUoxetine (PROZAC) 40 MG capsule Take 40 mg by mouth  daily.        . furosemide (LASIX) 20 MG tablet Take 20 mg by mouth 2 (two) times daily.        Marland Kitchen LYRICA 50 MG capsule       . metFORMIN (GLUMETZA) 500 MG (MOD) 24 hr tablet Take 500 mg by mouth daily with breakfast.        . metoprolol (LOPRESSOR) 100 MG tablet       . Multiple Vitamin (MULTIVITAMIN) capsule Take 1 capsule by mouth daily.        Marland Kitchen omeprazole (PRILOSEC) 20 MG capsule Take 20 mg by mouth daily.        . QUEtiapine (SEROQUEL) 100 MG tablet Take 100 mg by mouth at bedtime.        . Vitamin D, Ergocalciferol, (DRISDOL) 50000 UNITS CAPS Take 50,000 Units by mouth.        .       Family History  Problem Relation Age of Onset  . Breast cancer Maternal Aunt   . Colon polyps Neg Hx   . Colon cancer Neg Hx    History   Social History  . Marital Status: Single    Spouse Name: N/A  Number of Children: N/A  . Years of Education: N/A   Occupational History  . Not on file.   Social History Main Topics  . Smoking status: Current Everyday Smoker -- 0.5 packs/day    Types: Cigarettes  . Smokeless tobacco: Not on file  . Alcohol Use: No  . Drug Use: No  . Sexually Active: Not on file   Other Topics Concern  . Not on file   Social History Narrative  . No narrative on file   Review of Systems  All other systems reviewed and are negative.       Objective:   Physical Exam  Constitutional: She is oriented to person, place, and time. She appears well-developed and well-nourished. No distress.  HENT:  Head: Normocephalic and atraumatic.  Mouth/Throat: No oropharyngeal exudate.  Eyes: Pupils are equal, round, and reactive to light.  Neck: Neck supple.  Cardiovascular: Normal rate and regular rhythm.   Pulmonary/Chest: Effort normal.  Abdominal: Soft. Bowel sounds are normal. There is no tenderness. There is no rebound and no guarding.       MOD TTP in epigASTRIUM "severe" TTP in LLQ  Musculoskeletal: She exhibits no edema.  Lymphadenopathy:    She has no  cervical adenopathy.  Neurological: She is alert and oriented to person, place, and time.  Skin: No rash noted.  Psychiatric: She has a normal mood and affect.          Assessment & Plan:

## 2011-03-29 NOTE — Progress Notes (Signed)
Cc to PCP 

## 2011-03-29 NOTE — Telephone Encounter (Signed)
Fax from [facility], Levsin not covered, is it ok to try Bentyl?

## 2011-04-03 NOTE — Assessment & Plan Note (Signed)
[facility]                         ELECTROPHYSIOLOGY OFFICE NOTE   NAME:[name], Cynthia KOOK                  MRN:          [id number]  DATE:08/01/2007                            DOB:          1966-07-27    [name] comes in continuing to have problems with palpitations.  She also has significant limiting fatigue.  She also lives in an extreme  state of anxiety.  Her mother was recently diagnosed with  cardiomyopathy.  Her sister died at 41 with myocarditis and with  recurrent chest pain she is exceedingly anxious.   Her medications include:  1. Xanax 0.5 b.i.d.  2. Toprol-XL 100 b.i.d.  3. Diovan/hydrochlorothiazide 160/25.  4. Seroquel.  5. AcipHex 20.   On examination, her blood pressure today was well controlled at 124/88,  her pulse was 57, her weight was 192.  Her lungs were clear, her heart  sounds were regular, extremities were without edema.   IMPRESSION:  1. Premature ventricular complexes and premature atrial complexes.  2. Recent Holter monitor trying to clarify her symptoms with      palpitations and not finding anything except heart pressure.  3. Hypertension.  4. Fatigue, question related to Toprol-XL.   I have decided to cut her Toprol-XL in half.  She will do this for a  week.  I have then given her a prescription for atenolol 50, Inderal LA  120, and nadolol 40.  Each of these is for 14 days and she is to let us  know after about 6 weeks how it is that she is fairing with all of this.   I have reassured her as much as we can that based on her CT angiogram  that her likelihood of having coronary artery disease is really  exceeding small.   We will see her again in about 8 weeks' time.     Duke Salvia, MD, [facility]  Electronically Signed    SCK/MedQ  DD: 08/01/2007  DT: 08/02/2007  Job #: [id number]   cc:   Learta Codding, MD,FACC  Kirstie Peri, MD
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
## 2011-04-03 NOTE — Op Note (Signed)
[name], [name]           ACCOUNT NO.:  [id number]   MEDICAL RECORD NO.:  [id number]          PATIENT TYPE:  AMB   LOCATION:  DAY                           FACILITY:  [facility]   PHYSICIAN:  R. Roetta Sessions, M.D. DATE OF BIRTH:  1966-03-21   DATE OF PROCEDURE:  DATE OF DISCHARGE:  04/04/2007                               OPERATIVE REPORT   INDICATIONS FOR PROCEDURE:  A 45 year old lady with 10-year history of  postprandial diarrhea and abdominal cramps, gastroesophageal reflux  disease, somewhat poorly controlled.  EGD and colonoscopy now being  done. This was discussed with the patient at length.  Potential risks,  benefits, and alternatives have been reviewed.  Question answered and  she is agreeable.  Please see documentation in medical record.   PROCEDURE NOTE:  O2 saturation, blood pressure and pulse were monitored  throughout the entire procedure.  Conscious sedation with versed 6 mg  IV, Demerol 150 mg IV, Phenergan 25 mg IV delivered slow IV push prior  to the procedure to augment conscious sedation.  Cetacaine spray for  topical oropharyngeal anesthesia.   INSTRUMENTATION:  Pentax video chip system.   ESOPHAGOGASTRODUODENOSCOPY FINDINGS:  Examination to her esophagus  revealed no mucosal abnormalities.  EG junction was easily traversed,  viewing stomach and colon.  Gastric cavity was emptied and insufflated  well with air.  Through examination of the gastric mucosa with  retroflexion of the proximal stomach and esophagogastric junction  demonstrated no abnormalities aside from a small hiatal hernia.  Pylorus  was patent and easily traversed.  The second portion revealed no  abnormalities.   THERAPEUTIC/DIAGNOSTIC MANEUVERS PERFORMED:  None.   The patient tolerated the procedure well and was prepared for  colonoscopy.  Digital rectal exam revealed no abnormalities.   ENDOSCOPIC FINDINGS:  Prep was good.   COLON:  Colonic mucosa was surveyed from the rectosigmoid  junction to  the left, transverse, and right colon to the appendiceal orifice,  ileocecal valve, and cecum.  These structures were well seen and  photographed for the record.  From this level, previously imaged mucosal  surfaces were again seen.  The patient had pancolonic diverticula.  The  remaining colonic mucosa appeared normal.  The scope was then pulled  down to the rectum where thorough examination of the rectal mucosa,  including retroflexed view of the anal verge en phos of the anal canal  demonstrated a friable anorectal junction.  No mentioned above, the  terminal ileum was intubated.  Also, this segment of the GI tract  appeared normal.  The patient tolerated the above procedures well and  was reactive at endoscopy.   IMPRESSION:  EGD:  Small hiatal hernia.  Otherwise, normal esophagus,  stomach, D1 and D2.   Colonoscopy:  Friable anorectum but normal rectal mucosa.  Pancolonic  diverticula.  Mucosa appeared normal.  Normal terminal ileum.   RECOMMENDATIONS:  1. Stop Prilosec.  Begin AcipHex 20 mg orally daily.  Go by office for      free samples.  2. Anusol HC suppositories, one per rectum at bedtime x 10 days.  3. Levbid one tablet orally  twice daily.  Stop Bentyl.  4. Follow-up appointment with [place] in one month.      Jonathon Bellows, M.D.  Electronically Signed     RMR/MEDQ  D:  04/16/2007  T:  04/16/2007  Job:  [id number]

## 2011-04-03 NOTE — Assessment & Plan Note (Signed)
[name], [name]            CHART#:  [id number]   DATE:  07/01/2007                       DOB:  15-Jun-1966   CHIEF COMPLAINT:  Epigastric/upper abdominal pain, followup IBS and  GERD.   SUBJECTIVE:  The patient is a 45 year old female with a history of  chronic GERD and IBS.  She was last seen on May 21, 2007.  She was given  a trial of Levbid b.i.d. as well as adding Benefiber and taking Aciphex  20 mg daily.  She tells me she was doing very, very well; she was quite  pleased with her results.  She was having no more further bouts of  urgency with diarrhea or abdominal cramping and her bloating was less.  However, 4 days ago she developed a nauseous, sick feeling upon  awakening around 10 o'clock in the morning, this was prior to eating.  She describes the discomfort as 10 out of 10 on pain scale.  Most of the  discomfort was in her epigastrium and right upper quadrant.  She then  began to have nausea and vomiting.  She tells me she began vomiting  bile.  She felt like she could not eat or drink for 2 days.  Her  symptoms lasted for 2 days.  She did try her usual Levbid but it did not  seem to relieve her symptoms.  She denies any fever or chills with this.  Her symptoms have now resolved and she is back to her baseline.  She has  discontinue metformin per Dr. Sherril Croon and tells me she has noticed less  diarrhea since this medicine has been discontinued.   CURRENT MEDICATIONS:  See the list from July 01, 2007.   ALLERGIES:  NO KNOWN DRUG ALLERGIES.   OBJECTIVE:  VITAL SIGNS:  Weight 197 pounds, height 71 inches,  temperature 98.6, blood pressure 120/72 and pulse 64.  GENERAL:  The patient is a well-developed, well-nourished African  American female in no acute distress.  HEENT:  Her sclerae are clear, anicteric, conjunctivae pink.  Oropharynx  pink and moist without any lesions.  CHEST:  Heart regular rate and rhythm, normal S1-S2 without murmurs,  clicks, rubs or  gallops.  ABDOMEN:  Positive bowel sounds x4, soft, nontender, nondistended,  without palpable masses or hepatosplenomegaly.  Negative Murphy's sign.  No rebound, tenderness or guarding.  No hepatosplenomegaly or mass.  EXTREMITIES:  Without clubbing or edema bilaterally.   ASSESSMENT:  The patient is a 45 year old female with gastroesophageal  reflux disease and irritable bowel syndrome.  She had been doing quite  well until 3 days.  She developed acute onset of nausea, vomiting and  epigastric pain.  Given her symptoms I would question gallbladder  disease.  Not mentioned previously she did have an abdominal ultrasound  on July 03, 2006 which showed angiomyolipoma on the right kidney but  no evidence of gallbladder disease.  I feel she needs a HIDA scan to  rule out biliary dyskinesia at this time.   She is quite pleased with her progress as far as her irritable bowel  syndrome and gastroesophageal reflux disease is concerned.   PLAN:  1. HIDA scan.  2. Continue Aciphex 20 mg daily.  3. Continue Levbid b.i.d.  4. Continue fiber supplement of choice.  5. I will be calling the patient with results and schedule  followup at      that point.       Lorenza Burton, N.P.  Electronically Signed     Kassie Mends, M.D.  Electronically Signed    KJ/MEDQ  D:  07/01/2007  T:  07/02/2007  Job:  [id number]   cc:   Kirstie Peri, MD

## 2011-04-03 NOTE — Assessment & Plan Note (Signed)
[facility]                          [name] CARDIOLOGY OFFICE NOTE   NAME:[name], Cynthia SCARFO                  MRN:          [id number]  DATE:03/24/2007                            DOB:          06/11/1966    PRIMARY CARDIOLOGIST:  Dr. Lewayne Bunting.   REASON FOR VISIT:  Annual followup.   [name] is a 45 year old female, with a history of normal  coronary arteries by CT angiogram in May 2006, history of recurrent  palpitations with a negative 30-day event monitor in January 2006,  hypertension, type 2 diabetes mellitus, and recent tobacco smoking.   The patient also points out, however, that she has lost 40 pounds since  last seen by [place] in the clinic. She did not start smoking until  approximately a year ago, secondary to added stressors at home.   Clinically, she denies any exertional chest discomfort. Her chest pain  occurs ONLY when she feels palpitations, the latter which appear to be  occurring on a near daily basis in the recent past. There is no  associated presyncope, however.   Electrocardiogram  today reveals sinus bradycardia at 54 BPM with normal  axis and nonspecific ST abnormalities.   CURRENT MEDICATIONS:  1. Toprol XL 100 b.i.d.  2. Xanax 0.25 p.r.n.  3. Diovan 160/12.5 daily.  4. Zyrtec.  5. Seroquel 25 q.h.s.  6. Metformin 500 daily.   PHYSICAL EXAMINATION:  VITAL SIGNS:  Blood pressure 125/88, pulse 61,  regular, weight 191.  GENERAL:  A 45 year old female sitting upright in no distress.  HEENT:  Normocephalic, atraumatic.  NECK:  Palpable bilateral carotid pulses without bruits;  no JVD.  LUNGS:  Clear to auscultation in all fields.  HEART:  Regular rate and rhythm (S1, S2), no murmurs, rubs or gallops.  ABDOMEN:  Benign.  EXTREMITIES:  Minimally palpable peripheral pulses with trace edema.  NEUROLOGIC:  No focal deficit.   IMPRESSION:  1. Chest pain syndrome.      a.     With associated palpitations.      b.      Normal cardiac CT angiogram, May 2006.      c.     Negative adenosine Cardiolite; ejection fraction 65%, August       2006.  2. Longstanding history of palpitations.      a.     Negative event monitor, January 2006.  3. Type 2 diabetes mellitus.  4. Hypertension - well controlled.  5. Tobacco.  6. Sinus bradycardia.   PLAN:  1. Repeat evaluation for recurrent palpitations with a CardioNet      monitor.  2. Check basic metabolic profile and TSH level.  3. Schedule return clinic followup with myself and Dr. Andee [name] in 6      weeks, for review of study results and further recommendations.      Cynthia Serpe, PA-C       Learta Codding, MD,FACC    GS/MedQ  DD: 03/24/2007  DT: 03/24/2007  Job #: [id number]   cc:   Cynthia Peri, MD

## 2011-04-03 NOTE — Assessment & Plan Note (Signed)
NAMEBETHZY, HAUCK            CHART#:  [id number]   DATE:  05/21/2007                       DOB:  1966-05-28   CHIEF COMPLAINT:  Diarrhea.   SUBJECTIVE:  [name] is a 45 year old female with a history of  postprandial diarrhea and abdominal cramping as well as GERD.  She  underwent colonoscopy by Dr. Jena Gauss on Apr 04, 2007.  She was found to  have a friable anal rectum, but normal rectal mucosa, colonic  diverticula, and normal terminal no ileum.  She had an EGD, which showed  a small hiatal hernia, otherwise normal exam.  She continues to complain  of postprandial diarrhea.  She can have up to 4-5 loose stools, a day,  usually no more than four days a week.  She has not noticed any blood or  mucus and in her stools.  She states that or diarrhea is usually  directly after eating.  She tells me that her blood sugars have been  normal.  She is on metformin and has recently been told by Dr. Sherryll Burger that  she can discontinue this medication.  She has been on it for about a  year.  She has tried Levbid b.i.d.  She has noticed some relief, but  continues to have problems with postprandial diarrhea.  Her weight has  remained stable.  She denies any fever or chills.   CURRENT MEDICATIONS:  See list from May 21, 2007.  She is on Aciphex 20  mg daily.  She denies any breakthrough heartburn or indigestion on this  regimen.   ALLERGIES:  No known drug allergies.   OBJECTIVE:  VITAL SIGNS:  Weight 197 pounds.  Height 71 inches.  Temperature 98.4, blood pressure 110/84, and pulse 64.  GENERAL:  [name] is a well-developed, well-nourished African-  American female in no acute distress.HEENT:  Sclerae clear nonicteric.  Conjunctivae pink.  Oropharynx pink and moist without any lesions.HEART:  Regular rate and rhythm.  Normal S1-S2.  ABDOMEN:  Positive bowel sounds  x4, no bruits auscultated.  Soft, nontender, nondistended without  palpable mass or hepatosplenomegaly.  No rebound  tenderness or  guarding.EXTREMITIES:  Without clubbing or edema bilaterally.   ASSESSMENT:  [name] is a 45 year old female with a history of  postprandial diarrhea and her normal colonoscopy was reassuring.  I  suspect that we are dealing with irritable bowel syndrome.  She also has  chronic GERD well controlled on Aciphex.   PLAN:  1. If she is able to discontinue her metformin per Dr. Margaretmary Eddy      recommendations, I would suggest that she does this as this drug      has been known to cause diarrhea.  2. Trial of a lactose-free diet for at least two weeks.  I have given      her literature on this as well as IBS.  Levbid b.i.d. p.r.n.  I      would like her to start taking daily Imodium 2 mg first thing in      the morning.  3. She can had Benefiber or fiber supplement of choice to her daily      regimen.  4. Follow up office visit in six weeks to assess her progress.  If she      continues to have diarrhea, she is to keep a stool  diary and bring      it with her to the office visit.       Lorenza Burton, N.P.  Electronically Signed     Kassie Mends, M.D.  Electronically Signed    KJ/MEDQ  D:  05/22/2007  T:  05/23/2007  Job:  [id number]   cc:   Kirstie Peri, MD

## 2011-04-03 NOTE — Discharge Summary (Signed)
NAMEHARRIS, [name]                 ACCOUNT NO.:  [id number]   MEDICAL RECORD NO.:  [id number]          PATIENT TYPE:  IPS   LOCATION:  0301                          FACILITY:  [facility]   PHYSICIAN:  Jasmine Pang, M.D. DATE OF BIRTH:  November 07, 1966   DATE OF ADMISSION:  02/04/2009  DATE OF DISCHARGE:  02/07/2009                               DISCHARGE SUMMARY   IDENTIFICATION:  This is a 45 year old divorced African American female  who was admitted on a voluntary basis on February 04, 2009.   HISTORY OF PRESENT ILLNESS:  The patient was having routine therapy  session with her therapist for several years, Dr. Kieth Brightly.  She  states he felt he had reached his limits of his ability to help her.  The patient has had depression since at least 2002 when her brother was  killed in a motor vehicle accident.  Recently, she actually had been  thinking of plans to hurt herself, although she has made no attempts.  She has a number of relatives who died at an early age.  Lately, she has  been having dreams of being with them and talking to them and feels like  she would be better off dead.  She decided to follow her psychiatrist's  advice and come to the hospital.  She feels that if she can get on the  right medication, she will be okay.  Given her history for fibromyalgia,  Cymbalta was suggested.  However, she states this made her nauseated,  instead we will initiate antidepressant and antianxiety therapy with  Prozac.   PAST PSYCHIATRIC HISTORY:  The patient has never been inpatient.  She is  not followed by a psychiatrist.  She began therapy in 2002 with Dr.  Kieth Brightly after her brother was killed in a motor vehicle accident.   FAMILY HISTORY:  The patient states on the maternal side of her family,  she has a number of first cousins with depression and several have  actually attempted suicide.   ALCOHOL AND DRUG HISTORY:  The patient herself has never had any issues  with prescription drugs  or illegal substances.  She does smoke  cigarettes at times.   MEDICAL PROBLEMS:  Allergies, GERD, hypertension, degenerative disk  disease, and fibromyalgia.   CURRENT MEDICATIONS:  1. HyoMax SR 0.375 mg p.o. daily.  2. Zyrtec 10 mg p.o. daily.  3. Omeprazole 20 mg p.o. daily.  4. Xanax 0.5 mg p.o. t.i.d.  5. Diovan and hydrochlorothiazide 160/25 mg daily.   DRUG ALLERGIES:  No known drug allergies.   PHYSICAL FINDINGS:  There were no acute physical or medical problems  noted.  She was fully evaluated at [facility] ED.   LABORATORY DATA:  Her UDS is positive for benzodiazepines, but she is  prescribed these.  Alcohol was less than 5.  Her chemistry showed that  her potassium was slightly low at 3.3.  Glucose was slightly high at  108.  Hemoglobin and hematocrit were elevated at 17.1 and 50.2.   HOSPITAL COURSE:  Upon admission, the patient was started on Ambien 10  mg p.o. nightly p.r.n. for insomnia and her home medications of HyoMax  SR 0.375 mg p.o. daily, Zyrtec 10 mg p.o. daily, omeprazole 20 mg p.o.  daily, Xanax 0.5 mg p.o. t.i.d., Diovan and hydrochlorothiazide 160/25  mg p.o. daily.  She was also started on Prozac 20 mg daily and Seroquel  50 mg p.o. nightly and Motrin 600 mg q.i.d. p.r.n. pain from her  degenerative disk disease and fibromyalgia.  In individual sessions, the  patient discussed a number of stressors including feeling burned out  secondary to taking care of her mother, biologic children, and she has a  custody of a grandchild.  She has custody of children of her sister.  She admitted to feeling very depressed and anxious with consistent mood.  On February 06, 2009, the patient has slept well.  She was tolerating the  Prozac with no GI distress.  There was no suicidal ideation.  She was  looking forward to going home soon.  On February 07, 2009, mental status  had improved markedly from admission status.  The patient's mood was  less depressed and less  anxious.  She was friendly and cooperative with  good eye contact.  Psychomotor activity was within normal limits.  Speech was normal rate and flow.  Affect was consistent with mood.  There was no suicidal or homicidal ideation.  No self injurious  behavior.  No auditory or visual hallucinations.  No paranoia or  delusions.  Thoughts are logical and goal directed.  Thought content:  No predominate seen.  Cognitive was grossly intact.  Insight good.  Judgment good.  Impulse control good.  It was felt the patient was safe  for discharge today.   DISCHARGE DIAGNOSES:  Axis I:  Major depressive disorder, recurrent  severe without psychotic features.  Axis II:  None.  Axis III:  Allergies, gastroesophageal reflux disease, hypertension,  degenerative disk disease, and fibromyalgia.  Axis IV:  Severe (primary grief issues and support issues, pertinent  psychiatric illness, pertinent medical problems).  Axis V:  Global assessment of functioning was 50 at discharge.  Global  assessment of functioning was 25 upon admission.  Global assessment of  functioning highest past year was 65.   DISCHARGE PLAN:  There were no specific activity level or dietary  restrictions.   POST HOSPITAL CARE PLANS:  1. The patient's activity was restricted by her fibromyalgia and      degenerative disk disease.  There were no specific dietary      restrictions on discharge.  2. The patient will be seen by Dr. Lolly Mustache on March 08, 2009, at 9:45      a.m.  She will also be seen by Dr. Kieth Brightly on February 08, 2009, at      9 a.m.   DISCHARGE MEDICATIONS:  1. Xanax 0.5 mg t.i.d.  2. HyoMax 0.375 mg daily.  3. Zyrtec 10 mg daily.  4. Diovan 160/25 mg daily.  5. Omeprazole 20 mg daily.  6. Prozac 20 mg daily.  7. Seroquel 50 mg at bedtime.      Jasmine Pang, M.D.  Electronically Signed     BHS/MEDQ  D:  02/07/2009  T:  02/08/2009  Job:  [id number]

## 2011-04-03 NOTE — Assessment & Plan Note (Signed)
NAME:  [name], [name]                  CHART#:  [id number]   DATE:  11/06/2007                       DOB:  11-Apr-1966   Office followup, last seen 07/01/2007, epigastric pain and reflux  symptoms, post prandial abdominal cramps and diarrhea.  The patient is  doing extremely well with Levbid 1 tablet b.i.d. and Aciphex 20 mg  orally daily.  Reflux symptoms are pretty well settled down.  She is  telling me she has only had 3 episodes in the past 4 months of post  prandial abdominal cramps and diarrhea.  She is very pleased with her  progress.  She has lost 5 pounds, and needs to lose a little more weight  to really get down to ideal body weight range.  She is very happy with  her progress.  She likes Levbid and Aciphex very much.  Prior  colonoscopy back in May demonstrated a friable anorectal pancolonic  diverticula.  EGD demonstrated a small hiatal hernia only.  Prior  gallbladder ultrasound HIDA checked out okay with a gallbladder ejection  fraction of 63%.   CURRENT MEDICATIONS:  See updated list.   ALLERGIES:  No known drug allergies.   PHYSICAL EXAMINATION:  She looks well.  Weight 192, height 5 feet 11 inches, temp 98.1, BP 112/82, pulse 64.  SKIN:  Warm and dry.  CHEST:  Lungs are clear to auscultation.  CARDIAC EXAM:  Regular rate and rhythm without murmurs, gallops, or  rubs.  ABDOMEN:  Nondistended, positive bowel sounds, minimal epigastric  tenderness to palpation, no appreciable mass or organomegaly.   ASSESSMENT:  Gastroesophageal reflux disease symptoms well controlled on  Aciphex.  Diarrhea-predominant irritable bowel syndrome quiescent on  b.i.d. Levbid.  She is very pleased with her response.  We discussed the  benign but chronic nature of irritable bowel syndrome.   RECOMMENDATIONS:  1. Continue Levbid 1 tablet b.i.d. p.r.n., continue Aciphex 20 mg      orally daily.  2. I have asked for her to strive for a 15-pound weight loss over the      next 12 months.  3.  Unless something comes up, I plan to see this nice lady back in 12      months.       Jonathon Bellows, M.D.  Electronically Signed     RMR/MEDQ  D:  11/06/2007  T:  11/07/2007  Job:  [id number]   cc:   Kirstie Peri, MD

## 2011-04-03 NOTE — Assessment & Plan Note (Signed)
[facility]                          [facility] OFFICE NOTE   NAME:[name], Cynthia OLLIS                  MRN:          [id number]  DATE:05/08/2007                            DOB:          01/27/1966    CARDIOLOGIST:  Dr. Lewayne [name].   PRIMARY CARE PHYSICIAN:  Dr. Kirstie [name].   HISTORY OF PRESENT ILLNESS:  [name] is a 45 year old female  patient with a history of normal coronary arteries by CT angiogram with  a history of palpitations who recently presented back to the office for  annual follow up. She was set up for a 30 day CardioNet monitor. She  also had a BMET and TSH done. Her BMET did return with a potassium of  3.2, and her TSH was normal at 0.86. Her CardioNet monitor did show a  few episodes of PVCs and couplets, as well as one triplet. She had  mainly sinus rhythm and several episodes of sinus tachycardia. No SVT  was noted. She previously has a history of normal LV function. She  returns to the office today for follow up. She notes that she continues  to have palpitations. They have really not changed over the last several  years. She denies any syncope. She does note some light headedness and  near syncope from time to time. There really has been no change in those  symptoms. She continues to note some chest discomfort with her  palpitations. She denies significant shortness of breath.   MEDICATIONS:  1. Toprol XL 100 mg twice daily.  2. Xanax 0.25 mg 1 or 2 times per day.  3. Diovan HCT 160/12.5 mg daily.  4. Zyrtec 10 mg daily.  5. __________  25 mg nightly.  6. Metformin 500 mg daily.  7. Aciphex 20 mg daily.  8. Levbid b.i.d.  9. Hydrocodone p.r.n.   ALLERGIES:  No known drug allergies.   PHYSICAL EXAMINATION:  GENERAL:  She is a well developed, well nourished  female in no acute distress.  VITAL SIGNS:  Blood pressure 115/85, pulse 86, weight 193 pounds.  HEENT:  Normal.  NECK:  Without JVD.  CARDIAC:  S1,  S2. Regular rate and rhythm without murmurs.  LUNGS:  Clear to auscultation bilaterally with no rales.  ABDOMEN:  Soft and nontender, normal active bowel sounds, and no  organomegaly.  EXTREMITIES:  Without edema.   IMPRESSION:  1. Palpitations.  2. Normal coronary arteries by CT angiogram in May 2006.      a.     Negative adenosine Cardiolite in August 2006.  3. Good LV function with an EF of 65%.  4. Diabetes mellitus type 2.  5. Hypertension.  6. Hypokalemia.   PLAN:  The patient presents back to the office for follow up on her  monitor. Of note, she did have a low potassium when she was seen here  last. This has not yet been addressed. Today, I have placed her on K-Dur  20 mEq daily and we will get a repeat BMET in one week. I discussed her  case further with Dr. Andee [name]. She had no SVT noted  on her monitor. She  really only had sinus rhythm and sinus tachycardia. However, she  continues to be symptomatic. We will refer her to Dr. Graciela [name] for any  further recommendations regarding her palpitations and sinus  tachycardia. She will follow up here with Dr. Andee [name] in six months.      Tereso Newcomer, PA-C  Electronically Signed      Learta Codding, MD,FACC  Electronically Signed   SW/MedQ  DD: 05/08/2007  DT: 05/09/2007  Job #: [id number]   cc:   Cynthia Peri, MD

## 2011-04-03 NOTE — H&P (Signed)
NAMEMAYLI, COVINGTON           ACCOUNT NO.:  [id number]   MEDICAL RECORD NO.:  [id number]          PATIENT TYPE:  AMB   LOCATION:  DAY                           FACILITY:  [facility]   PHYSICIAN:  R. Roetta Sessions, M.D. DATE OF BIRTH:  [birth year]   DATE OF ADMISSION:  04/01/2007  DATE OF DISCHARGE:  LH                              HISTORY & PHYSICAL   HISTORY OF PRESENT ILLNESS:  [name] is a self-referred 45-year-  old female who complains of an almost 10-year history of postprandial  diarrhea, along with abdominal cramping.  She states over the last two  years, however, her symptoms have become worse.  As soon as she eats,  she goes to the bathroom.  She says she sees immediately what she has  just eaten in her stool.  She complains of water stools, on average five  to eight times a day.  She complains of a lot of abdominal cramping in  the generalized abdomen, a lot of mucus in her stools.  She has noticed  trace of bright red blood in the stool.  She says the pain in her  abdomen is 10/10 at times.  She also complains of daily heartburn and  indigestion, for which she takes Mylanta which does seem to help some.  She has been on Prilosec for years without any good relief.  She has  nausea on almost a daily basis and has vomited at least twice a month.   PAST MEDICAL/SURGICAL HISTORY:  1. Hypertension.  2. Hyperglycemia.  3. Left renal lesion that is being followed.  4. Partial hysterectomy in 2000.  5. Benign breast biopsy, right.  6. Ankle and knee surgery.  7. She has had shoulder and back surgeries.  8. A breast reduction.   CURRENT MEDICATIONS:  1. Alprazolam 5 mg b.i.d.  2. Prilosec 12 mg daily.  3. Toprol 200 mg daily.  4. Diovan 160 mg daily.  5. Seroquel 10 mg q.h.s.  6. Metformin 500 mg daily.  7. Zyrtec 10 mg daily.   ALLERGIES:  No known drug allergies.   FAMILY HISTORY:  No known family history of colorectal carcinoma, liver  or chronic GI problems.   No family history of IBD.   SOCIAL HISTORY:  [name] is married.  She is unemployed.  She  denies any alcohol or drug use.  She has an eight-year-history of  tobacco use.   REVIEW OF SYSTEMS:  CONSTITUTIONAL:  She has an intentional 40-pound  weight loss in the last year.  She denies any fever or chills.  CARDIOVASCULAR:  Denies chest pain  or palpitations.  RESPIRATORY:  Denies dyspnea, cough, hemoptysis.  GI:  See the HPI.   PHYSICAL EXAMINATION:  VITAL SIGNS:  Weight is 197 pounds, height 71  inches, temperature 98.1 degrees, blood pressure 126/100, pulse 64.  GENERAL:  She is a well-developed and well-nourished female, in no acute  distress.  HEENT:  Clear.  Anicteric conjunctivae. Oropharynx moist without any  lesions.  Her tonsils are 2+.  HEART:  Regular rate and rhythm.  Normal S1 and S2.  No murmurs, clicks,  rubs or gallops.  LUNGS:  Clear to auscultation bilaterally.  ABDOMEN:  Positive bowel sounds x4.  No bruits ausculted.  Soft,  nontender.  Without palpable mass or hepatosplenomegaly.  No rebound  tenderness or guarding.  EXTREMITIES:  No clubbing or edema bilaterally.   IMPRESSION:  [name] is a 45 year old Caucasian female with an  almost 10-year-history of postprandial diarrhea and abdominal cramping.  Over the last two years her symptoms have worsened.  She is having an  average of five to eight mucus-like stools which are occasionally  bloody.  She is also having chronic nausea and vomits at least a couple  of times a month.  She has abdominal pain, heartburn, indigestion on a  daily basis as well:  She is going to need further evaluation to rule  out inflammatory bowel disease, although some of her symptoms could be  related to IBS with benign anorectal bleeding.  She does appear to have  an element of gastroesophageal reflux disease and we should rule out  peptic ulcer disease as well.   PLAN:  1. Begin Bentyl 10 mg before meals and at  bedtime, #60, with one      refill.  2. Continue Prilosec 20 mg daily.  3. An EGD and colonoscopy with Dr. Jonathon Bellows in the near      future.  I have discussed the procedures, including the risks and      benefits, including but not limited to bleeding, infection,      perforation and drug reaction.  She agrees and an informed consent      will be obtained.  4. She is taking half of her Metformin on the day prior to the      procedure.      Nicholas Lose, N.P.      Jonathon Bellows, M.D.  Electronically Signed    KC/MEDQ  D:  04/01/2007  T:  04/01/2007  Job:  [id number]

## 2011-04-03 NOTE — Letter (Signed)
June 05, 2007    Learta Codding, MD,FACC  [address]  [place], [place] [address]   RE:  ERNISHA, SORN  MRN:  [id number]  /  DOB:  1966-03-15   Dear Michelle Piper:   It was a pleasure to see Ms. Singletary today in consultation because of  palpitations.   As you know, she is a 45 year old lady who has a 20 year history of  palpitations.  She has normal heart by CT angio and by echo.   She has 2 different types of palpitation syndromes, one is skipping and  the other is racing.  The racing typically lasts seconds to minutes,  never more than 5 to 10.   These are associated with exercise intolerance and these are somewhat  __________.  Of note that these have been becoming increasingly frequent  over the recent years and she now has difficulty functioning because of  them.  She also finds that they awaken her at night.  She wakes up  fatigued.   She has been on Toprol for many years at a dose of 200 mg twice daily  for her blood pressure.  She recently went from a proprietary to a  generic formulation.  She also takes Xanax, Diovan/hydrochlorothiazide,  Zyrtec, Seroquel, Aciphex and hyoscyamine.   She has no known drug allergies.   PAST MEDICAL HISTORY:  In the addition to the above is noted for:  1. GE reflux disease, hiatal hernia.  2. Anxiety/depression.  3. Arthritis.  4. Menstrual dysfunction.  5. Allergies.   PAST SURGICAL HISTORY:  Is notable for:  1. Hysterectomy.  2. Right ankle surgery.  3. Right shoulder surgery.  4. Microdiskectomy.  5. Breast reduction surgery.   SOCIAL HISTORY:  She is separated.  She has 2 children.  She does not  use cigarettes, alcohol or recreational drugs; she has stopped in the  last week or two.   She does exercise regularly.   EXAMINATION:  She is a middle-aged African-American female appearing her  stated age of 65.  Her blood pressure today was 114/78 with a pulse of 69.  Her weight was  195.  She is 5 feet 11 inches.  HEENT:  Demonstrated no icterus or xanthoma.  The neck veins were flat.  Carotids were brisk and full bilaterally  without bruits.  BACK:  Without kyphosis, scoliosis.  LUNGS:  Clear.  HEART:  Sounds regular without murmur or gallops.  ABDOMEN:  Soft with active bowel sounds without midline pulsation or  hepatomegaly.  Femoral pulses were 2+.  Distal pulses were intact.  There is no  clubbing, cyanosis or edema.  NEURO:  Grossly normal.  SKIN:  Warm and dry.   Electrocardiogram dated today demonstrated sinus rhythm at 69 with  intervals of 0.14/0.08/0.36.  The axis was mildly leftward at 18.  The T  waves were flattened.   Review of the event recorder demonstrated some ventricular ectopy  correlating with skips and she also had some evidence of sinus  tachycardia without clearly discernible T wave changes.   IMPRESSION:  1. Two palpitation syndromes.      a.     Skips associated with premature ventricular contractions and       nonsustained ventricular tachycardia that appears to be from the       right ventricular outflow tract.      b.     Heart rate racing without clinical correlation.  2. Hypertension.  3. Anxiety/depression.  4. Fatigue.   Michelle Piper, Mrs. Minta Balsam has 2 palpitation syndromes; one is the PVCs which  is mildly problematic. The other is the racing.  She also has  significant fatigue.  She thinks that the increasing nocturnal racing  may be associated with fatigue and she may be right.  I wonder also  whether it may be the formulation change of her metoprolol succinate.   At any case, the issue is what does her racing palpitations correlate  with and I cannot tell from the CardioNet monitor.  To that end, I have  asked that she get a 24-hour Holter monitor to review this and to have  her use it strictly so that we can associate symptoms with rhythm.   She is quite frustrated about all of this, but I do not know a better  way to approach it than that.    Thanks very much for the consultation.    Sincerely,      Duke Salvia, MD, [facility]  Electronically Signed    SCK/MedQ  DD: 06/05/2007  DT: 06/06/2007  Job #: [id number]   CC:    Kirstie Peri, MD

## 2011-04-05 ENCOUNTER — Encounter (HOSPITAL_COMMUNITY): Payer: Medicare Other | Admitting: Psychiatry

## 2011-04-06 NOTE — H&P (Signed)
[name], [name]           ACCOUNT NO.:  [id number]   MEDICAL RECORD NO.:  [id number]          PATIENT TYPE:  AMB   LOCATION:  DAY                           FACILITY:  [facility]   PHYSICIAN:  Vickki Hearing, M.D.DATE OF BIRTH:  [birth year]   DATE OF ADMISSION:  DATE OF DISCHARGE:  LH                                HISTORY & PHYSICAL   CHIEF COMPLAINT:  Persistent left knee pain.   HISTORY:  She is a 45 year old female with some history of depression.  She  has degenerative disk disease, status post surgical diskectomy in the lumbar  spine.  She has a history of fibromyalgia.  She has had one left knee  arthroscopy in March of 2005.  At the time of surgery, we found  chondromalacia of the medial femoral condyle, grade 1 and chondromalacia of  the trochlea, grade 1.  She had a chondroplasty of the medial femoral  condyle.  She has not really gotten any better since that surgery.  We  thought perhaps some of her pain was coming from her degenerative disk  disease and she had surgery for that.  Repeat studies on her back and it  shows that the nerve should have recovered and is free.   She continues to complain of anterior knee pain with giving way.  We tried  physical therapy to strengthen her quadriceps mechanism and it did not help.   On this occasion, we will attempt to see if there is anything else wrong  with the knee.  I did tell her that if we do not find anything, she will be  left with what she has in terms of knee function.  We expect to perhaps find  some further degeneration of the cartilage in her knee.   REVIEW OF SYSTEMS:  Joint pain, depression, otherwise normal.   CURRENT MEDICATIONS:  1.  Diovan.  2.  Toprol.  3.  Zyrtec.   FAMILY HISTORY:  1.  Heart disease.  2.  Asthma.  3.  Arthritis.  4.  Cancer.   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  She is married.  No longer  employed.  She does not smoke or drink.  She does use caffeine.  She  completed a high school education.   PHYSICAL EXAMINATION:  GENERAL:  Shows a well-developed, well-nourished  female.  Grooming and hygiene are normal.  She has no gross deformities.  VITAL SIGNS:  Her pulse is in the 70s.  Her respiratory rate in the 20s.  Her normal weight is approximately 210 pounds.  CARDIOVASCULAR:  She has no swelling or major varicosities.  Good pulses and  temperature.  No edema, no tenderness.  NODES:  Cervical lymph nodes are benign.  MUSCULOSKELETAL:  She has normal gait and station.  Her left knee shows full  range of motion.  There is some quadriceps atrophy and strength deficit in  extension.  She has fairly good muscle tone, normal alignment.  She has  diffuse peripatellar tenderness with crepitance on range of motion.  Her  opposite extremity shows normal alignment, range of motion, strength and  stability.  NEUROPSYCHOLOGICAL:  Normal  SKIN:  Normal.   MEDICAL DECISION MAKING:  Diagnosis:  Anterior knee pain syndrome.  Recommend arthroscopy left knee.  717.7.  CPT [phone].       SEH/MEDQ  D:  05/31/2005  T:  05/31/2005  Job:  [id number]

## 2011-04-06 NOTE — H&P (Signed)
[name], [name]                     ACCOUNT NO.:  [id number]   MEDICAL RECORD NO.:  [id number]                   PATIENT TYPE:  AMB   LOCATION:  DAY                                  FACILITY:  [facility]   PHYSICIAN:  Vickki Hearing, M.D.           DATE OF BIRTH:  Feb 16, 1966   DATE OF ADMISSION:  DATE OF DISCHARGE:                                HISTORY & PHYSICAL   PREOPERATIVE HISTORY AND PHYSICAL   Left knee pain.   HISTORY:  This is a 45 year old female with a history of depression,  degenerative joint disease, fibromyalgia status post breast biopsies, 2  shoulder surgeries, hysterectomy, and 2 foot surgeries who presented with  left knee pain and was found to have degenerative changes of the medial  femoral condyle on MRI.  We treated her nonoperatively with an injection,  anti-inflammatories and general nonoperative knee care including knee  exercises and a Lidoderm patch.  She did well for approximately 2 months.  She has progressed to now having giving-way episodes of the left knee.   REVIEW OF SYSTEMS:  Indicate migraines, joint pain, depression, otherwise  normal.   MEDICINES:  Diovan, Toprol, Zyrtec--pharmacy [facility].   FAMILY HISTORY:  Heart disease, asthma, arthritis, cancer.   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 She does not smoke or drink.  Does use caffeine.  Completed  her education through the 12th grade.   PHYSICAL EXAMINATION:  GENERAL:  She is well-developed and nourished.  Grooming and hygiene are normal.  She has no gross deformities.  VITAL SIGNS:  Pulse is 74, respiratory rate was 20.  Her weight is 210.  CARDIOVASCULAR EXAM:  Observation.  EXTREMITIES :  No swelling or varicosities, palpation and pulse is normal  with normal temperature.  No edema, no tenderness.  LYMPH NODES AND CERVICAL SPINE:  Normal.  MUSCULOSKELETAL:  Gait and station--left knee antalgic gait.  Range of  motion remains  good.  Muscle strength and tone normal.  Stability of limb  normal. Muscle tone normal, alignment normal.  There is some patellofemoral  tenderness medially and medial femoral condylar tenderness.  There is no  apprehension.  SKIN:  Normal.  NEUROPSYCHIATRIC:  Shows normal coordination, reflexes and sensation.  She  is oriented x3.  The mood was normal.  The MRI shows thinning of the medial  femoral condyle.   DIAGNOSES:  Osteoarthritis, loose body left knee.  Code 715.16, 717.6.   PLAN:  Arthroscopy, removal of loose body, debridement of medial femoral  condyle if indicated.  [address].     ___________________________________________                                         Vickki Hearing, M.D.   SEH/MEDQ  D:  01/13/2004  T:  01/13/2004  Job:  [id number] 

## 2011-04-06 NOTE — Op Note (Signed)
NAMELAVAYAH, [name]           ACCOUNT NO.:  [id number]   MEDICAL RECORD NO.:  [id number]          PATIENT TYPE:  AMB   LOCATION:  DAY                           FACILITY:  [facility]   PHYSICIAN:  Vickki Hearing, M.D.DATE OF BIRTH:  08-05-1966   DATE OF PROCEDURE:  06/01/2005  DATE OF DISCHARGE:                                 OPERATIVE REPORT   PREOPERATIVE DIAGNOSIS:  Anterior knee pain syndrome, left knee.   POSTOPERATIVE DIAGNOSIS:  Chondromalacia and lateral patellar compression  syndrome.   PROCEDURE:  Arthroscopy, left knee, debridement of trochlea cartilage  ,microfracture and lateral release.   SURGEON:  Dr. Romeo Apple, no assistants.   ANESTHETIC:  General.   INDICATIONS FOR PROCEDURE:  Persistent knee pain and giving way.   HISTORY:  This is a 45 year old female status post previous left knee  arthroscopy, found chondromalacia patella and trochlea. The patient did not  fully recover from this procedure, went on to have lumbar disk surgery,  continued to have giving way of the left knee with pain in the left knee at  the front of the knee. She tried a course of physical therapy, still did not  improve and presented for redo arthroscopy, diagnostic and therapeutic.   The procedure was done as follows:  She was identified in preop holding area  as [name]. Left knee was marked as the surgical site,  countersigned by the surgeon. History of physical was updated. Antibiotics  were given. She was taken to the operating room for general anesthesia at  her choice   After successful anesthesia, left leg was prepped and draped using sterile  technique. A time-out was taken, all parameters were completed as required  and completed satisfactorily.   A diagnostic arthroscopy was done through a lateral portal, examining all  structures of the knee. All compartments were normal except for the  patellofemoral compartment and the trochlea. From the notch area back  up  towards the patellar pouch was a longitudinal cartilage flap with  delamination from the underlying subchondral bone. This was debrided back to  a stable rim using a straight motorized shaver and arthroscopic wand using  ArthroCare wand. We then performed a lateral release, after it was noted  that the  patella never tracked in the patella groove and there was a  lateral overhang. After lateral release, the patella was subluxatable above  the horizontal plane at approximately 80 degrees, and we got a more central  tracking of the patella.   Knee was irrigated, washed, suctioned, closed portals with the Steri-Strips.  We injected 30 cc of Marcaine, applied sterile dressings and a CryoCuff  under compression, extubated the patient, took her to recovery room in  stable condition.   POSTOPERATIVE PLAN:  Follow-up in two days. She can be full weightbearing as  tolerated. She will be in a knee brace. She should use CryoCuff full time.  She was given a prescription for Lorcet Plus 1 every 4 hours #60.       SEH/MEDQ  D:  06/01/2005  T:  06/01/2005  Job:  [id number]

## 2011-04-06 NOTE — Op Note (Signed)
NAME:  [name], [name]                     ACCOUNT NO.:  [id number]   MEDICAL RECORD NO.:  [id number]                   PATIENT TYPE:  OIB   LOCATION:  2899                                 FACILITY:  [facility]   PHYSICIAN:  Donalee Citrin, M.D.                     DATE OF BIRTH:  1966-01-20   DATE OF PROCEDURE:  07/17/2004  DATE OF DISCHARGE:                                 OPERATIVE REPORT   PREOPERATIVE DIAGNOSIS:  Left L4 radiculopathy from extraforaminal disk at  L4-5 on the left.   POSTOPERATIVE DIAGNOSIS:  Left L4 radiculopathy from extraforaminal disk at  L4-5 on the left.   PROCEDURE:  External approach to laminectomy and microdiskectomy at L4-5 on  the left.   SURGEON:  Donalee Citrin, M.D.   ASSISTANT:  Kathaleen Maser. Pool, M.D.   ANESTHESIA:  General endotracheal anesthesia.   INDICATIONS FOR PROCEDURE:  The patient is a very pleasant 45 year old  female who has had long-standing back and left leg pain radiating down the  outside of her left leg into the front of her shin consistent with an L4  nerve root distribution.  The patient underwent an MRI and myelography which  showed a foraminal disk protruding on the L4 nerve root.  She has basically  been refractory to all forms of conservative treatment and the patient was  recommended extraforaminal decompression and laminectomy at the L4 nerve  root.  I discussed all the risks of surgery with her and she understands and  agrees to proceed forward.   DESCRIPTION OF PROCEDURE:  The patient was brought to the operating room  where she was induced under general anesthesia.  The patient was placed  prone on the Wilson frame and her back was prepped and draped in the usual  sterile fashion.  Preoperative x-ray confirmed localization of the L4  spinous process at the L3-4 disk space. A midline incision was made just  inferior to this and Bovie electrocautery was used to take down the  subcutaneous tissue and a subperiosteal dissection  was carried out to the  lamina of what was believed to be L4-5.  However, magnification confirmed  localization of the probe which was placed on the TP at what was based to be  the L3 TP.  Attention was taken one interspace below this.  The TP was  gotten out and then the pars, lateral aspect of the pars and lateral  superior aspect of the facet complex was identified.  Using a #1 Penfield  and a #4 Penfield, the muscle and inner transverse ligament was dissected  free off the lateral pars and the medial facet complex. A high speed drill  was used to drill out the lateral aspect of the pars and medial facet and  lateral facet.  Then 2 and 3 mm Kerrison punches were used to under bite the  lateral pars and lateral facet complex.  Then  the inner transverse ligament  was identified, teased away with a blunt nerve hook and bitten away with the  2 and 3 mm Kerrison punch.  The L4 nerve root was immediately identified.  This was dissected free.  The operating microscope was draped and brought  onto the field and with microscope visualization the L4 nerve root was  identified. A coronary dilator was used and placed underneath the nerve  root. There was noted to be some compression of what appeared to be lateral  disk and osteophyte combination coming off the lateral aspect of the disk  space irritating the 4 root. This was all pushed back down in the disk space  with a downgoing Epstein curette and blunt nerve hook and coronary dilator.  Then pituitary rongeurs were used to clean up the disk space and several  fragments of disk were removed. At the end of the diskectomy, there was no  further stenosis on the 4 root.  A coronary dilator and angled hockey stick  were freely passed all along the route of the 4 root and then the wound was  copiously irrigated and meticulous hemostasis was maintained.  Gelfilm was  overlaid on top of the dura and nerve root. The muscle and fascia were  reapproximated   with 0 interrupted Vicryl and the subcutaneous tissues were closed with 2-0  interrupted Vicryl.  The skin was closed with a running 4-0 subcuticular and  Benzoin and Steri-Strips were applied.  The patient went to the recovery  room in stable condition.  At the end of the case, there were correct needle  and sponge counts.                                               Donalee Citrin, M.D.    GC/MEDQ  D:  07/17/2004  T:  07/17/2004  Job:  [id number]

## 2011-04-06 NOTE — Op Note (Signed)
NAMERENISE, GILLIES                     ACCOUNT NO.:  [id number]   MEDICAL RECORD NO.:  [id number]                   PATIENT TYPE:  AMB   LOCATION:  DAY                                  FACILITY:  [facility]   PHYSICIAN:  Vickki Hearing, M.D.           DATE OF BIRTH:  1966/08/13   DATE OF PROCEDURE:  DATE OF DISCHARGE:                                 OPERATIVE REPORT   PREOPERATIVE DIAGNOSES:  Osteoarthritis left knee, medial meniscal tear left  knee.   POSTOPERATIVE DIAGNOSES:  Osteoarthritis and chondromalacia left knee.   SURGEON:  Vickki Hearing, M.D.   ANESTHESIA:  General.   FINDINGS:  Medial femoral condyle grade 1 chondromalacia, trochlear  chondromalacia grade 1.   PROCEDURE:  Chondroplasty medial femoral condyle.   INDICATIONS FOR PROCEDURE:  Giving way left knee.   DESCRIPTION OF PROCEDURE:  The patient was identified as [name]  in the preoperative holding area. Her medical record indicated by consent  and history that she was to have a left knee arthroscopy.  She marked the  left knee. I placed my initials over this mark, she was given Ancef and  taken to the operating room for a general anesthetic after which time she  had a brief exam under anesthesia which was normal. She had a sterile prep  and drape, we took a time out, confirmed the patient's identity as [name], the procedure as an arthroscopy and the extremity and site as  left knee.   Standard three incision arthroscopy technique was used, diagnostic  arthroscopy was performed.  Medial working portal was established.  A probe  was placed through this portal and the structures of the knee were palpated  as they were visualized. The only findings were grade 1 chondromalacia of  the medial femoral condyle and mild fraying of the trochlear cartilage.  There was redundant synovium in the medial compartment which was resected  for better visualization and a chondroplasty was  performed on the medial  femoral condyle.   The knee was irrigated and suctioned free of fluid and debris. The portals  were closed with Steri-Strips and 0.25% Marcaine, 30 mL was injected into  the knee joint and sterile bandages CryoCuff were applied. The patient was  extubated and taken to the recovery room in stable condition with a  postoperative plan to followup in two days and to be full weightbearing as  tolerated.   A further workup of the patient's lumbar spine for disk disease,  degenerative arthritis will be needed to elucidate the cause of her left  knee pain.      ___________________________________________                                            Vickki Hearing, M.D.   SEH/MEDQ  D:  01/18/2004  T:  01/18/2004  Job:  [id number]

## 2011-04-17 ENCOUNTER — Encounter (HOSPITAL_COMMUNITY): Payer: Medicare Other | Admitting: Psychiatry

## 2011-05-11 ENCOUNTER — Encounter (INDEPENDENT_AMBULATORY_CARE_PROVIDER_SITE_OTHER): Payer: Medicare Other | Admitting: Psychology

## 2011-05-11 DIAGNOSIS — F332 Major depressive disorder, recurrent severe without psychotic features: Secondary | ICD-10-CM

## 2011-06-06 ENCOUNTER — Encounter (HOSPITAL_COMMUNITY): Payer: Medicare Other | Admitting: Psychology

## 2011-06-21 ENCOUNTER — Ambulatory Visit (INDEPENDENT_AMBULATORY_CARE_PROVIDER_SITE_OTHER): Payer: Medicare Other | Admitting: Orthopedic Surgery

## 2011-06-21 ENCOUNTER — Encounter: Payer: Self-pay | Admitting: Orthopedic Surgery

## 2011-06-21 ENCOUNTER — Other Ambulatory Visit: Payer: Self-pay

## 2011-06-21 DIAGNOSIS — M25562 Pain in left knee: Secondary | ICD-10-CM

## 2011-06-21 DIAGNOSIS — M1710 Unilateral primary osteoarthritis, unspecified knee: Secondary | ICD-10-CM

## 2011-06-21 DIAGNOSIS — M25569 Pain in unspecified knee: Secondary | ICD-10-CM

## 2011-06-21 MED ORDER — METHYLPREDNISOLONE ACETATE 40 MG/ML IJ SUSP: 40.0000 mg | Freq: Once | INTRAMUSCULAR | Status: DC

## 2011-06-21 NOTE — Patient Instructions (Signed)
You have received a steroid shot. 15% of patients experience increased pain at the injection site with in the next 24 hours. This is best treated with ice and tylenol extra strength 2 tabs every 8 hours. If you are still having pain please call the office.   Ankle exercises / aso  Brace x 3 weeks   Brace knee econ hinge

## 2011-06-21 NOTE — Progress Notes (Signed)
LEFT knee pain  45 year old female history of LEFT knee pain and unexplained radicular pain in LEFT lower extremity pain previously evaluated by pain management and lumbar x-rays MRI nerve conduction studies, currently managed at the pain clinic presents with LEFT knee pain worsening also complains of LEFT ankle pain after a twisting inversion injury to the LEFT ankle about 2 weeks ago complains of lateral ankle pain swelling and giving out of the LEFT knee as well as the LEFT ankle  Review of systems:  Exam she is a well-developed well-nourished female she is awake alert and oriented x3 her mood and affect are normal  She presents with a mild limp favoring the LEFT lower extremity  Her LEFT ankle is tender over the lateral collateral ligaments although her range of motion is normal her drawer test is negative her muscle strength is intact skin is normal  Her LEFT knee is tender to the medial compartment and has crepitus on range of motion, there is no joint effusion.  Range of motion is normal, ligaments are stable.  Strength is normal and the skin has no rash or lesion  Chest normal pulse and temperature with no edema or swelling.  Sensation remains normal reflexes are intact coordination and balance are normal  Separate identifiable x-ray report  X-rays of the LEFT knee show spurs on the medial lateral side of the patellofemoral joint most notable on the sunrise view.  There is mild medial joint space narrowing.  Alignment is normal.  Impression osteoarthritis LEFT knee  Injected LEFT knee  Diagnosis #1 osteoarthritis LEFT knee Diagnosis #2 sprain LEFT ankle for the treatment plan includes ASO brace LEFT ankle, ankle exercises for 3 weeks and continue with injections for pain control LEFT knee.  Knee  Injection Procedure Note  Pre-operative Diagnosis: left knee oa  Post-operative Diagnosis: same  Indications: pain  Anesthesia: ethyl chloride   Procedure Details   Verbal  consent was obtained for the procedure. Time out was completed.The joint was prepped with alcohol, followed by  Ethyl chloride spray and A 20 gauge needle was inserted into the knee via lateral approach; 4ml 1% lidocaine and 1 ml of depomedrol  was then injected into the joint . The needle was removed and the area cleansed and dressed.  Complications:  None; patient tolerated the procedure well.

## 2011-07-04 ENCOUNTER — Ambulatory Visit: Payer: Medicare Other | Admitting: Gastroenterology

## 2011-07-04 ENCOUNTER — Telehealth: Payer: Self-pay | Admitting: Gastroenterology

## 2011-07-04 NOTE — Telephone Encounter (Signed)
noted 

## 2011-07-11 ENCOUNTER — Encounter (INDEPENDENT_AMBULATORY_CARE_PROVIDER_SITE_OTHER): Payer: Medicare Other | Admitting: Psychology

## 2011-07-11 DIAGNOSIS — F332 Major depressive disorder, recurrent severe without psychotic features: Secondary | ICD-10-CM

## 2011-07-19 ENCOUNTER — Encounter (INDEPENDENT_AMBULATORY_CARE_PROVIDER_SITE_OTHER): Payer: Medicare Other | Admitting: Psychiatry

## 2011-07-19 ENCOUNTER — Encounter (HOSPITAL_COMMUNITY): Payer: Medicare Other | Admitting: Psychiatry

## 2011-07-19 DIAGNOSIS — F39 Unspecified mood [affective] disorder: Secondary | ICD-10-CM

## 2011-07-25 ENCOUNTER — Encounter (HOSPITAL_COMMUNITY): Payer: Medicare Other | Admitting: Psychology

## 2011-07-27 NOTE — Progress Notes (Signed)
  [name], [name]                 ACCOUNT NO.:  [id number]  MEDICAL RECORD NO.:  [id number]  LOCATION:  [facility]                           FACILITY:  [facility]  PHYSICIAN:  Carly Sabo T. Requan Hardge, M.D.   DATE OF BIRTH:  11-Mar-1966                                PROGRESS NOTE   The patient came in today for her followup appointment.  She was last seen on May 1.  At that time, she had decompensated from her psychiatric illness as she had stopped taking the medication.  We had restarted the Seroquel and asked her to come back in a few weeks; however, the patient did not come on appointment until today.  She said that she was taking care of her family member and mother and could not come for the appointment; however, the patient has been compliant with the Seroquel 100 mg.  She has been feeling much calmer, relaxed, and able to sleep at least 6 hours.  Recently she has had an issue to get an appointment with the pain doctor as she was terminated from her previous pain doctor when her results showed that she was not taking enough pain medication.  The patient tried to explain to them that she was sick and had missed 3 days of pain medication; however, she is in the process of getting a new pain referral.  Overall she has been doing much better.  Her mood has been calm, and there has been no agitation or anger noticed.  She has been seeing Dr. Kieth Brightly on a regular basis.  Her weight today is 200 pounds.  She said that she has been doing more exercise and controlling her diet and there has been no recent weight gain.  MENTAL STATUS EXAMINATION: The patient is casually dressed, groomed, maintaining good eye contact. Her speech is soft, clear and coherent.  Her thought process is logical, linear and goal-directed.  She denies any auditory hallucinations, suicidal thoughts or homicidal thoughts.  There is no psychosis present. She is alert and oriented x3.  DIAGNOSIS: AXIS I:  Major depressive disorder,  recurrent, with psychotic features. AXIS II:  Deferred. AXIS III:  Seasonal allergies, GERD, hypertension, degenerative disk disease, and fibromyalgia. AXIS IV:  Moderate. AXIS V:  60 to 65.  PLAN: We will continue her Seroquel 100 mg, which is also helping her depression and insomnia.  She has been not taking the Prozac and trazodone and reports doing very well without these medications.  We will get lab tests, including CBC, CMP and hemoglobin A1c.  I explained the risks and benefits of medication in detail, including chances of relapse if she is not taking any antidepressant; however, the patient declined to take antidepressant at this time.  She will continue to see Dr. Kieth Brightly on a regular basis, and I will see her again in 8 weeks     Aryanna Shaver T. Lolly Mustache, M.D.    STA/MEDQ  D:  07/19/2011  T:  07/19/2011  Job:  [id number]  Electronically Signed by Kathryne Sharper M.D. on 07/27/2011 09:09:32 AM

## 2011-08-01 ENCOUNTER — Ambulatory Visit (INDEPENDENT_AMBULATORY_CARE_PROVIDER_SITE_OTHER): Payer: Medicare Other | Admitting: Psychology

## 2011-08-01 DIAGNOSIS — F332 Major depressive disorder, recurrent severe without psychotic features: Secondary | ICD-10-CM

## 2011-08-22 ENCOUNTER — Encounter (INDEPENDENT_AMBULATORY_CARE_PROVIDER_SITE_OTHER): Payer: Medicare Other | Admitting: Psychology

## 2011-08-22 DIAGNOSIS — F332 Major depressive disorder, recurrent severe without psychotic features: Secondary | ICD-10-CM

## 2011-09-12 ENCOUNTER — Encounter (HOSPITAL_COMMUNITY): Payer: Medicare Other | Admitting: Psychology

## 2011-09-13 ENCOUNTER — Encounter (HOSPITAL_COMMUNITY): Payer: Medicare Other | Admitting: Psychiatry

## 2011-09-27 NOTE — Progress Notes (Deleted)
Patient:   [name]   DOB:   [birth year]  MR Number:  [id number]  Location:  BEHAVIORAL [facility] [address] [place] [address] Dept: [phone]           Date of Service:   @TODAY @  Start Time:   *** End Time:   ***  Provider/Observer:  Hershal Coria PSYD       Billing Code/Service: [phone]  Chief Complaint:    No chief complaint on file.   Reason for Service:  ***  Current Status:  ***  Reliability of Information: ***  Behavioral Observation: [name]  presents as a 45 y.o.-year-old {Handed:22697} {Race/ethnicity:17218} {INFANT GENDER IN OR:22171} who appeared her stated age. her dress was {Desc;appropriate/inappropriate:5787::"Appropriate"} and she was {Appearance:22683} and her manners were {Desc;appropriate/inappropriate:5787::"Appropriate"} to the situation.  There {were/were MVH:84696} any physical disabilities noted.  she displayed an {Desc; ppropriate/inappropriate:30686::"appropriate"} level of cooperation and motivation.    Interactions:    {[facility] PARTICIPATION EXBMW:41324}   Attention:   {Desc; normal/abnormal/low/high:18745}  Memory:   {Desc; normal/abnormal/low/high:18745}  Visuo-spatial:   {Desc; normal/abnormal/low/high:18745}  Speech (Volume):  {desc; low/normal/high/v MWNU:27253}  Speech:   {findings; speech psych:31885}  Thought Process:  {[facility] THOUGHT PROCESS:22309}  Though Content:  {[facility] THOUGHT CONTENT:22310}  Orientation:   {orientation:30299}  Judgment:   {[facility] JUDGMENT:22312}  Planning:   {[facility] JUDGMENT:22312}  Affect:    {[facility] AFFECT:22266}  Mood:    {[facility] MOOD:22306}  Insight:   {Insight (PAA):22695}  Intelligence:   {desc; low/normal/high/v GUYQ:03474}  Marital Status/Living: ***  Current Employment: @EMPNAME @ ***  Past Employment:  ***  Substance Use:  {Substance abuse:20568}  ***  Education:   {Education:22679}  Medical  History:   Past Medical History  Diagnosis Date  . Depression   . Diabetes mellitus   . LBP (low back pain)   . BMI (body mass index) 20.0-29.9 2008 192 lbs  . GERD (gastroesophageal reflux disease) 2002    202 lbs  . Irritable bowel syndrome 2008    diarrhea predominant MEDS TRIED: BENTYL, LEVBID  . RUQ abdominal pain 2004 [facility] CTA w/ IVC-FATTY LIVER, ANGIOMYOLIPOMA LEFT KIDNEY    2008 [facility]        Outpatient Encounter Prescriptions as of 08/01/2011  Medication Sig Dispense Refill  . ALPRAZolam (XANAX) 0.5 MG tablet Take 0.5 mg by mouth at bedtime as needed.        . cetirizine (ZYRTEC) 10 MG tablet Take 10 mg by mouth daily.        Marland Kitchen dicyclomine (BENTYL) 10 MG capsule       . DIOVAN HCT 160-25 MG per tablet       . FLUoxetine (PROZAC) 40 MG capsule Take 40 mg by mouth daily.        . furosemide (LASIX) 20 MG tablet Take 20 mg by mouth 2 (two) times daily.        . hyoscyamine (LEVSIN, ANASPAZ) 0.125 MG tablet 1-2 po before meals TID and at bedtime. No more than 8 tabs a day.  60 tablet  5  . LYRICA 50 MG capsule       . metFORMIN (GLUMETZA) 500 MG (MOD) 24 hr tablet Take 500 mg by mouth daily with breakfast.        . methylPREDNISolone acetate (DEPO-MEDROL) 40 MG/ML injection Inject 1 mL (40 mg total) into the articular space once.  1 mL  0  . metoprolol (LOPRESSOR) 100 MG  tablet       . Multiple Vitamin (MULTIVITAMIN) capsule Take 1 capsule by mouth daily.        Marland Kitchen omeprazole (PRILOSEC) 20 MG capsule Take 20 mg by mouth daily.        Marland Kitchen oxyCODONE-acetaminophen (PERCOCET) 10-325 MG per tablet       . QC PEN NEEDLES 31G X 6 MM MISC       . QUEtiapine (SEROQUEL) 100 MG tablet Take 100 mg by mouth at bedtime.        . Vitamin D, Ergocalciferol, (DRISDOL) 50000 UNITS CAPS Take 50,000 Units by mouth.              ***  Sexual History:   History  Sexual Activity  . Sexually Active: Not on file    Abuse/Trauma History: ***  Psychiatric History:  ***  Family Med/Psych  History:  Family History  Problem Relation Age of Onset  . Breast cancer Maternal Aunt   . Colon polyps Neg Hx   . Colon cancer Neg Hx     Risk of Suicide/Violence: {desc; high/low:14016} ***  Impression/DX:  ***  Disposition/Plan:  ***  Diagnosis:    Axis I:  No diagnosis found.      Axis II: {psych axis 2:31910}       Axis III:  ***      Axis IV:  {psych axis iv:31915}          Axis V:  {psych axis v score:31919}

## 2011-11-20 HISTORY — PX: OTHER SURGICAL HISTORY: SHX169

## 2011-11-20 HISTORY — PX: COLONOSCOPY: SHX174

## 2012-01-02 ENCOUNTER — Ambulatory Visit (HOSPITAL_COMMUNITY): Payer: Medicare Other | Admitting: Psychology

## 2012-01-16 ENCOUNTER — Other Ambulatory Visit: Payer: Self-pay | Admitting: Gastroenterology

## 2012-03-25 ENCOUNTER — Other Ambulatory Visit: Payer: Self-pay | Admitting: Gastroenterology

## 2012-03-25 NOTE — Telephone Encounter (Signed)
Pt overdue for FU OV IBS Given 1 refill until office visit on Bentyl.

## 2012-03-26 ENCOUNTER — Encounter: Payer: Self-pay | Admitting: Gastroenterology

## 2012-03-26 NOTE — Telephone Encounter (Signed)
Mailed letter to patient to call office to set up OV °

## 2012-04-30 ENCOUNTER — Ambulatory Visit (INDEPENDENT_AMBULATORY_CARE_PROVIDER_SITE_OTHER): Payer: Medicare Other | Admitting: Psychology

## 2012-04-30 DIAGNOSIS — F329 Major depressive disorder, single episode, unspecified: Secondary | ICD-10-CM

## 2012-05-19 ENCOUNTER — Ambulatory Visit (INDEPENDENT_AMBULATORY_CARE_PROVIDER_SITE_OTHER): Payer: Medicare Other | Admitting: Psychology

## 2012-05-19 DIAGNOSIS — F329 Major depressive disorder, single episode, unspecified: Secondary | ICD-10-CM

## 2012-05-21 ENCOUNTER — Encounter (HOSPITAL_COMMUNITY): Payer: Self-pay | Admitting: Psychology

## 2012-05-21 NOTE — Progress Notes (Signed)
Patient:  [name]   DOB: [birth year]  MR Number: [id number]  Location: BEHAVIORAL [facility] [address] [place] [address] Dept: [phone]  Start: 1 PM End: 2 PM  Provider/Observer:     Hershal Coria PSYD  Chief Complaint:      Chief Complaint  Patient presents with  . Depression  . Anxiety  . Stress  . Trauma    Reason For Service:     The patient was referred because of increasing symptoms of depression. Relationship issues, financial problems, medical issues, and loss of her brother in 2002 were all problematic situations for her to deal with. Most recently, major conflicts between her, her knees, and her daughter have also been quite disruptive. At this point, the patient has continued to struggle with severe back issues along with severe depression.  Interventions Strategy:  Cognitive/behavioral psychotherapy  Participation Level:   Active  Participation Quality:  Appropriate      Behavioral Observation:  Well Groomed, Alert, and Appropriate.   Current Psychosocial Factors: The patient reports that she has kicked her boyfriend out of the house and she has recently moved into. She has gone through foreclosure procedures on her previous house. The patient is avoiding getting back into any type of serious relationship, although her ex-husband has contacted her wanting to try to start the relationship back as well as another gentleman who has had interest in her. However, she is working on trying to get her life together.   Content of Session:   Reviewed current symptoms and continued work on therapeutic interventions  Current Status:   The patient has been doing very poorly more recently because of struggle she has been having with her boyfriend who is turned to substance abuse.  Patient Progress:   Stable  Target Goals:   Target goals include reducing symptoms of depression,  anxiety, and increase coping skills.  Last Reviewed:   04/30/2012  Goals Addressed Today:    Today we worked on adjusted issues with the dissolution of her relationship with "PJ"  Impression/Diagnosis:   The patient has had significant psychosocial stressors throughout her life going on going back to childhood. Most recently she has been taking care of her g as well as her sister's children. Her sister died several years ago and she has raised her sister's children. She also adopted one of her sister's children her. The patient has her own daughter who has that was serious and significant substance abuse. The patient has recurrent major depressive events.randmother who had a severe stroke  Diagnosis:    Axis I:  1. Major depressive disorder         Axis II: No diagnosis

## 2012-05-21 NOTE — Progress Notes (Signed)
Patient:  [name]   DOB: [birth year]  MR Number: [id number]  Location: BEHAVIORAL [facility] [address] [place] [address] Dept: [phone]  Start: 1 PM End: 2 PM  Provider/Observer:     Hershal Coria PSYD  Chief Complaint:      Chief Complaint  Patient presents with  . Depression  . Anxiety  . Stress    Reason For Service:     The patient was referred because of increasing symptoms of depression. Relationship issues, financial problems, medical issues, and loss of her brother in 2002 were all problematic situations for her to deal with. Most recently, major conflicts between her, her knees, and her daughter have also been quite disruptive. At this point, the patient has continued to struggle with severe back issues along with severe depression.  Interventions Strategy:  Cognitive/behavioral psychotherapy  Participation Level:   Active  Participation Quality:  Appropriate      Behavioral Observation:  Well Groomed, Alert, and Appropriate.   Current Psychosocial Factors: The patient has been able to keep her ex-boyfriend out of the house and she is had little or no contact with him. The patient reports that her daughter has moved back into the house with her granddaughter but she is in the process of taking her daughter of this her daughter has returned to going to the patient's medications. The patient has not used any narcotic pain medications as she fully realizes that they were not been of benefit to her and were not helping with her pain in which is leaving her in a situation where people were motivated to steal from her or try to take advantage of her. The patient reports that she has struggled on her on but is doing much better. She has been having advances made by her ex-husband as well as another gentleman who both of interest in getting into her relationship with her again..    Content of Session:   Reviewed current symptoms and continued work on therapeutic interventions  Current Status:   The patient has been doing very poorly more recently because of struggle she has been having with her boyfriend who is turned to substance abuse.  Patient Progress:   Stable  Target Goals:   Target goals include reducing symptoms of depression, anxiety, and increase coping skills.  Last Reviewed:   05/19/2012  Goals Addressed Today:    Today we worked on adjusted issues with the dissolution of her relationship with "PJ"  Impression/Diagnosis:   The patient has had significant psychosocial stressors throughout her life going on going back to childhood. Most recently she has been taking care of her g as well as her sister's children. Her sister died several years ago and she has raised her sister's children. She also adopted one of her sister's children her. The patient has her own daughter who has that was serious and significant substance abuse. The patient has recurrent major depressive events.randmother who had a severe stroke  Diagnosis:    Axis I:  1. Major depression         Axis II: No diagnosis

## 2012-06-09 ENCOUNTER — Ambulatory Visit (INDEPENDENT_AMBULATORY_CARE_PROVIDER_SITE_OTHER): Payer: Medicare Other | Admitting: Psychology

## 2012-06-09 DIAGNOSIS — F329 Major depressive disorder, single episode, unspecified: Secondary | ICD-10-CM

## 2012-06-23 ENCOUNTER — Ambulatory Visit (INDEPENDENT_AMBULATORY_CARE_PROVIDER_SITE_OTHER): Payer: Medicare Other | Admitting: Psychology

## 2012-06-23 DIAGNOSIS — F329 Major depressive disorder, single episode, unspecified: Secondary | ICD-10-CM

## 2012-06-26 ENCOUNTER — Ambulatory Visit (HOSPITAL_COMMUNITY): Payer: Self-pay | Admitting: Psychiatry

## 2012-07-01 ENCOUNTER — Ambulatory Visit (HOSPITAL_COMMUNITY): Payer: Self-pay | Admitting: Psychiatry

## 2012-07-08 ENCOUNTER — Ambulatory Visit (INDEPENDENT_AMBULATORY_CARE_PROVIDER_SITE_OTHER): Payer: Medicare Other | Admitting: Psychology

## 2012-07-08 ENCOUNTER — Encounter (HOSPITAL_COMMUNITY): Payer: Self-pay | Admitting: Psychology

## 2012-07-08 DIAGNOSIS — F329 Major depressive disorder, single episode, unspecified: Secondary | ICD-10-CM

## 2012-07-08 NOTE — Progress Notes (Signed)
Patient:  [name]   DOB: October 10, 1966  MR Number: [id number]  Location: BEHAVIORAL [facility] [address] [place] [address] [place] [address] Dept: [phone]  Start: 2PM End: 3 PM  Provider/Observer:     Hershal Coria PSYD  Chief Complaint:      Chief Complaint  Patient presents with  . Depression  . Anxiety  . Stress  . Trauma    Reason For Service:     The patient was referred because of increasing symptoms of depression. Relationship issues, financial problems, medical issues, and loss of her brother in 2002 were all problematic situations for her to deal with. Most recently, major conflicts between her, her knees, and her daughter have also been quite disruptive. At this point, the patient has continued to struggle with severe back issues along with severe depression.  Interventions Strategy:  Cognitive/behavioral psychotherapy  Participation Level:   Active  Participation Quality:  Appropriate      Behavioral Observation:  Well Groomed, Alert, and Appropriate.   Current Psychosocial Factors: The patient had to call the police on her oldest daughter.  It was hard for her but actually worked so far as the daughter has not been back after the second call.  The patient still is experiencing severe depression but improved to some degree..   Content of Session:   Reviewed current symptoms and continued work on therapeutic interventions  Current Status:   The patient has gotten rid of bf and getting better control of her daughter etc..  Patient Progress:   Stable  Target Goals:   Target goals include reducing symptoms of depression, anxiety, and increase coping skills.  Last Reviewed:   07/08/2012  Goals Addressed Today:    Today we worked on adjusted issues with the dissolution of her relationship with "PJ"  Impression/Diagnosis:   The patient has had significant psychosocial stressors throughout  her life going on going back to childhood. Most recently she has been taking care of her g as well as her sister's children. Her sister died several years ago and she has raised her sister's children. She also adopted one of her sister's children her. The patient has her own daughter who has that was serious and significant substance abuse. The patient has recurrent major depressive events.randmother who had a severe stroke  Diagnosis:    Axis I:  1. Major depression         Axis II: No diagnosis

## 2012-07-10 ENCOUNTER — Encounter (HOSPITAL_COMMUNITY): Payer: Self-pay | Admitting: Psychology

## 2012-07-10 NOTE — Progress Notes (Signed)
Patient:  [name]   DOB: 1966-03-15  MR Number: [id number]  Location: BEHAVIORAL [facility] [address] [place] [address] Dept: [phone]  Therapy session length:   60 minutes  Provider/Observer:     Hershal Coria PSYD  Chief Complaint:      Chief Complaint  Patient presents with  . Depression  . Anxiety  . Stress    Reason For Service:     The patient was referred because of increasing symptoms of depression. Relationship issues, financial problems, medical issues, and loss of her brother in 2002 were all problematic situations for her to deal with. Most recently, major conflicts between her, her knees, and her daughter have also been quite disruptive. At this point, the patient has continued to struggle with severe back issues along with severe depression.  Interventions Strategy:  Cognitive/behavioral psychotherapy  Participation Level:   Active  Participation Quality:  Appropriate      Behavioral Observation:  Well Groomed, Alert, and Appropriate.   Current Psychosocial Factors: The patient reports that she continues to struggle  with her daughter. The daughter continues to show better house unannounced and calls happened within the household. The daughter is continued to abuse drugs and essentially left her second child with the patient most the time to take care of herthem was done through child protective services his daughter is planning her children for food stamps and other support.   Content of Session:   Reviewed current symptoms and continued work on therapeutic interventions  Current Status:   The patient has been doing very poorly more recently because of struggle she has been having with her boyfriend who is turned to substance abuse.  Patient Progress:   Stable  Target Goals:   Target goals include reducing symptoms of depression, anxiety, and increase coping  skills.  Last Reviewed:   06/09/2012  Goals Addressed Today:    Today we worked on adjusted issues with the dissolution of her relationship with "PJ"  Impression/Diagnosis:   The patient has had significant psychosocial stressors throughout her life going on going back to childhood. Most recently she has been taking care of her g as well as her sister's children. Her sister died several years ago and she has raised her sister's children. She also adopted one of her sister's children her. The patient has her own daughter who has that was serious and significant substance abuse. The patient has recurrent major depressive events.randmother who had a severe stroke  Diagnosis:    Axis I:  1. Major depression         Axis II: No diagnosis

## 2012-07-10 NOTE — Progress Notes (Signed)
Patient:  [name]   DOB: 02-Jun-1966  MR Number: [id number]  Location: BEHAVIORAL [facility] [address] [place] [address] Dept: [phone]  Therapy session length:   60 minutes  Provider/Observer:     Hershal Coria PSYD  Chief Complaint:      Chief Complaint  Patient presents with  . Depression  . Anxiety  . Stress  . Trauma    Reason For Service:     The patient was referred because of increasing symptoms of depression. Relationship issues, financial problems, medical issues, and loss of her brother in 2002 were all problematic situations for her to deal with. Most recently, major conflicts between her, her knees, and her daughter have also been quite disruptive. At this point, the patient has continued to struggle with severe back issues along with severe depression.  Interventions Strategy:  Cognitive/behavioral psychotherapy  Participation Level:   Active  Participation Quality:  Appropriate      Behavioral Observation:  Well Groomed, Alert, and Appropriate.   Current Psychosocial Factors: The patient finally got up the nerve to call the police after our past visit where we talked to the police [place] about the situation she is facing. We talked with objective there and he gave her a number to call when she again has problems with her daughter. The patient called the next day and essentially dispatcher first refused to send an officer there an incident he would that the officer never showed up. Luckily, the next-door neighbor and had a realtor come in to show the neighbor's house and the alarm went off and the police showed up for that and her daughter believe that this visit was about her and her daughter ran away from home. She reports the daughter has not been back to the house since then and she is ready to call up again if need be. This was very difficult for the patient is she does not  want to make any more situation for her daughter but she has to take care of herself..   Content of Session:   Reviewed current symptoms and continued work on therapeutic interventions  Current Status:   The patient has been doing very poorly more recently because of struggle she has been having with her boyfriend who is turned to substance abuse.  Patient Progress:   Stable  Target Goals:   Target goals include reducing symptoms of depression, anxiety, and increase coping skills.  Last Reviewed:   06/23/2012  Goals Addressed Today:    Today we worked on adjusted issues with the dissolution of her relationship with "PJ"  Impression/Diagnosis:   The patient has had significant psychosocial stressors throughout her life going on going back to childhood. Most recently she has been taking care of her g as well as her sister's children. Her sister died several years ago and she has raised her sister's children. She also adopted one of her sister's children her. The patient has her own daughter who has that was serious and significant substance abuse. The patient has recurrent major depressive events.randmother who had a severe stroke  Diagnosis:    Axis I:  1. Major depression         Axis II: No diagnosis

## 2012-07-15 ENCOUNTER — Encounter (HOSPITAL_COMMUNITY): Payer: Self-pay | Admitting: Psychiatry

## 2012-07-15 ENCOUNTER — Ambulatory Visit (INDEPENDENT_AMBULATORY_CARE_PROVIDER_SITE_OTHER): Payer: Medicare Other | Admitting: Psychiatry

## 2012-07-15 VITALS — Wt 198.0 lb

## 2012-07-15 DIAGNOSIS — F329 Major depressive disorder, single episode, unspecified: Secondary | ICD-10-CM

## 2012-07-15 DIAGNOSIS — F323 Major depressive disorder, single episode, severe with psychotic features: Secondary | ICD-10-CM

## 2012-07-15 MED ORDER — FLUOXETINE HCL 10 MG PO CAPS: ORAL_CAPSULE | ORAL | Status: DC
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
## 2012-07-30 ENCOUNTER — Ambulatory Visit (INDEPENDENT_AMBULATORY_CARE_PROVIDER_SITE_OTHER): Payer: Medicare Other | Admitting: Psychology

## 2012-07-30 DIAGNOSIS — F329 Major depressive disorder, single episode, unspecified: Secondary | ICD-10-CM

## 2012-07-31 ENCOUNTER — Encounter (HOSPITAL_COMMUNITY): Payer: Self-pay | Admitting: Psychology

## 2012-07-31 NOTE — Progress Notes (Signed)
Patient:  [name]   DOB: 1966/05/10  MR Number: [id number]  Location: BEHAVIORAL [facility] [address] [place] [address] Dept: [phone]  Start: 2PM End: 3 PM  Provider/Observer:     Hershal Coria PSYD  Chief Complaint:      Chief Complaint  Patient presents with  . Depression  . Anxiety  . Stress    Reason For Service:     The patient was referred because of increasing symptoms of depression. Relationship issues, financial problems, medical issues, and loss of her brother in 2002 were all problematic situations for her to deal with. Most recently, major conflicts between her, her knees, and her daughter have also been quite disruptive. At this point, the patient has continued to struggle with severe back issues along with severe depression.  Interventions Strategy:  Cognitive/behavioral psychotherapy  Participation Level:   Active  Participation Quality:  Appropriate      Behavioral Observation:  Well Groomed, Alert, and Appropriate.   Current Psychosocial Factors: The patient reports that her attempts to try to get her daughter to stay away when she is using or doing other things inappropriately as worked for the most part. There were 2 times where her daughter and the patient's sister came over after an eye out and been up all night but she was able to tell her daughter to leave and her daughter did leave. However, the daughters youngest daughter (the patient's granddaughter) is now essentially been arrested on the patient on top of the other child of her daughter. The patient feels like there is no way that she can take care of this child and his looking for options including turning the child over to child protective services..   Content of Session:   Reviewed current symptoms and continued work on therapeutic interventions  Current Status:   The patient has gotten rid of bf and  getting better control of her daughter etc..  Patient Progress:   Stable  Target Goals:   Target goals include reducing symptoms of depression, anxiety, and increase coping skills.  Last Reviewed:   07/30/2012  Goals Addressed Today:    Today we worked on adjusted issues with the dissolution of her relationship with "PJ"  Impression/Diagnosis:   The patient has had significant psychosocial stressors throughout her life going on going back to childhood. Most recently she has been taking care of her g as well as her sister's children. Her sister died several years ago and she has raised her sister's children. She also adopted one of her sister's children her. The patient has her own daughter who has that was serious and significant substance abuse. The patient has recurrent major depressive events.randmother who had a severe stroke  Diagnosis:    Axis I:  1. Major depression         Axis II: No diagnosis

## 2012-08-01 ENCOUNTER — Other Ambulatory Visit: Payer: Self-pay

## 2012-08-01 MED ORDER — DICYCLOMINE HCL 10 MG PO CAPS: 10.0000 mg | ORAL_CAPSULE | Freq: Three times a day (TID) | ORAL | Status: DC

## 2012-08-05 ENCOUNTER — Ambulatory Visit (INDEPENDENT_AMBULATORY_CARE_PROVIDER_SITE_OTHER): Payer: Medicare Other | Admitting: Psychiatry

## 2012-08-05 ENCOUNTER — Other Ambulatory Visit (HOSPITAL_COMMUNITY): Payer: Self-pay | Admitting: Psychiatry

## 2012-08-05 ENCOUNTER — Encounter (HOSPITAL_COMMUNITY): Payer: Self-pay | Admitting: Psychiatry

## 2012-08-05 VITALS — Wt 198.0 lb

## 2012-08-05 DIAGNOSIS — F323 Major depressive disorder, single episode, severe with psychotic features: Secondary | ICD-10-CM

## 2012-08-05 DIAGNOSIS — F329 Major depressive disorder, single episode, unspecified: Secondary | ICD-10-CM

## 2012-08-05 MED ORDER — TOPIRAMATE 25 MG PO TABS: 25.0000 mg | ORAL_TABLET | Freq: Every day | ORAL | Status: DC

## 2012-08-05 MED ORDER — FLUOXETINE HCL 20 MG PO CAPS: 20.0000 mg | ORAL_CAPSULE | Freq: Every day | ORAL | Status: DC
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
## 2012-08-06 NOTE — Telephone Encounter (Signed)
New prescription given 08/05/12 with change directions

## 2012-08-20 ENCOUNTER — Ambulatory Visit (HOSPITAL_COMMUNITY): Payer: Self-pay | Admitting: Psychology

## 2012-09-04 ENCOUNTER — Ambulatory Visit (INDEPENDENT_AMBULATORY_CARE_PROVIDER_SITE_OTHER): Payer: Medicare Other | Admitting: Psychiatry

## 2012-09-04 ENCOUNTER — Encounter (HOSPITAL_COMMUNITY): Payer: Self-pay | Admitting: Psychiatry

## 2012-09-04 VITALS — Wt 197.0 lb

## 2012-09-04 DIAGNOSIS — F323 Major depressive disorder, single episode, severe with psychotic features: Secondary | ICD-10-CM

## 2012-09-04 DIAGNOSIS — F329 Major depressive disorder, single episode, unspecified: Secondary | ICD-10-CM

## 2012-09-04 MED ORDER — FLUOXETINE HCL 20 MG PO CAPS: 20.0000 mg | ORAL_CAPSULE | Freq: Every day | ORAL | Status: DC

## 2012-09-04 MED ORDER — TOPIRAMATE 25 MG PO TABS: 25.0000 mg | ORAL_TABLET | Freq: Every day | ORAL | Status: DC
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
## 2012-09-26 ENCOUNTER — Ambulatory Visit (INDEPENDENT_AMBULATORY_CARE_PROVIDER_SITE_OTHER): Payer: Medicare Other | Admitting: Psychology

## 2012-09-26 DIAGNOSIS — F329 Major depressive disorder, single episode, unspecified: Secondary | ICD-10-CM

## 2012-10-06 ENCOUNTER — Encounter (HOSPITAL_COMMUNITY): Payer: Self-pay | Admitting: Psychology

## 2012-10-06 NOTE — Progress Notes (Signed)
Patient:  [name]   DOB: Jun 12, 1966  MR Number: [id number]  Location: BEHAVIORAL [facility] [address] [place] [address] Dept: [phone]  Start: 3 PM End: 4 PM  Provider/Observer:     Hershal Coria PSYD  Chief Complaint:      Chief Complaint  Patient presents with  . Depression    Reason For Service:     The patient was referred because of increasing symptoms of depression. Relationship issues, financial problems, medical issues, and loss of her brother in 2002 were all problematic situations for her to deal with. Most recently, major conflicts between her, her knees, and her daughter have also been quite disruptive. At this point, the patient has continued to struggle with severe back issues along with severe depression.  Interventions Strategy:  Cognitive/behavioral psychotherapy  Participation Level:   Active  Participation Quality:  Appropriate      Behavioral Observation:  Well Groomed, Alert, and Appropriate.   Current Psychosocial Factors: The patient reports that the situation between her and her daughter has improved immensely. The daughter has been staying away when there is any hint of her using any type of substances. The patient reports that her sister has been continuing to stay in her house and being a more often but she is not coming by when the sister is using any type of substance. This is actually not been too bad she thinks that this is helping her sister stay away from substance use. There is also been some improvement in the relationship with her adopted daughter who is had a lot of behavioral problems in the past..   Content of Session:   Reviewed current symptoms and continued work on therapeutic interventions  Current Status:   The patient has gotten rid of bf and getting better control of her daughter etc..  Patient Progress:   Stable  Target Goals:   Target  goals include reducing symptoms of depression, anxiety, and increase coping skills.  Last Reviewed:   09/26/2012  Goals Addressed Today:    Today we worked on adjusted issues with the dissolution of her relationship with "PJ"  Impression/Diagnosis:   The patient has had significant psychosocial stressors throughout her life going on going back to childhood. Most recently she has been taking care of her g as well as her sister's children. Her sister died several years ago and she has raised her sister's children. She also adopted one of her sister's children her. The patient has her own daughter who has that was serious and significant substance abuse. The patient has recurrent major depressive events.randmother who had a severe stroke  Diagnosis:    Axis I:  1. Major depression         Axis II: No diagnosis

## 2012-10-22 ENCOUNTER — Ambulatory Visit (INDEPENDENT_AMBULATORY_CARE_PROVIDER_SITE_OTHER): Payer: Medicare Other | Admitting: Psychology

## 2012-10-22 DIAGNOSIS — F329 Major depressive disorder, single episode, unspecified: Secondary | ICD-10-CM

## 2012-10-28 ENCOUNTER — Encounter (HOSPITAL_COMMUNITY): Payer: Self-pay | Admitting: Psychology

## 2012-10-28 NOTE — Progress Notes (Signed)
Patient:  [name]   DOB: 1966-07-24  MR Number: [id number]  Location: BEHAVIORAL [facility] [address] [place] [address] Dept: [phone]  Start: 3 PM End: 4 PM  Provider/Observer:     Hershal Coria PSYD  Chief Complaint:      Chief Complaint  Patient presents with  . Depression    Reason For Service:     The patient was referred because of increasing symptoms of depression. Relationship issues, financial problems, medical issues, and loss of her brother in 2002 were all problematic situations for her to deal with. Most recently, major conflicts between her, her knees, and her daughter have also been quite disruptive. At this point, the patient has continued to struggle with severe back issues along with severe depression.  Interventions Strategy:  Cognitive/behavioral psychotherapy  Participation Level:   Active  Participation Quality:  Appropriate      Behavioral Observation:  Well Groomed, Alert, and Appropriate.   Current Psychosocial Factors: The patient reports that things have been going bit better at home. Her daughter [name] has had little to no contact and has not been coming to the house which is a significant improvement. The patient reports that her sister has been there and while the patient's sister is not engage in any drug use at her home there have been a couple of situations where people came to see the patient's sister. The patient reports that she is having to cope with this and it is a stressor but overall things have been going better from a psychosocial standpoint.   Content of Session:   Reviewed current symptoms and continued work on therapeutic interventions  Current Status:   The patient has gotten rid of bf and getting better control of her daughter etc..  Patient Progress:   Stable  Target Goals:   Target goals include reducing symptoms of depression,  anxiety, and increase coping skills.  Last Reviewed:   10/22/2012  Goals Addressed Today:    Today we worked on adjusted issues with the dissolution of her relationship with "PJ"  Impression/Diagnosis:   The patient has had significant psychosocial stressors throughout her life going on going back to childhood. Most recently she has been taking care of her g as well as her sister's children. Her sister died several years ago and she has raised her sister's children. She also adopted one of her sister's children her. The patient has her own daughter who has that was serious and significant substance abuse. The patient has recurrent major depressive events.randmother who had a severe stroke  Diagnosis:    Axis I:  1. Major depression         Axis II: No diagnosis

## 2012-11-03 ENCOUNTER — Other Ambulatory Visit: Payer: Self-pay | Admitting: Gastroenterology

## 2012-11-04 ENCOUNTER — Ambulatory Visit (HOSPITAL_COMMUNITY): Payer: Self-pay | Admitting: Psychiatry

## 2012-11-04 NOTE — Telephone Encounter (Signed)
One refill provided. Patient overdue for OV with [facility].

## 2012-11-04 NOTE — Telephone Encounter (Signed)
Called and informed pt. Routing to [place] to schedule OV.

## 2012-11-06 ENCOUNTER — Encounter: Payer: Self-pay | Admitting: Gastroenterology

## 2012-11-06 NOTE — Telephone Encounter (Signed)
Mailed letter to patient to call our office to set up OV to further refills °

## 2012-11-13 ENCOUNTER — Ambulatory Visit (INDEPENDENT_AMBULATORY_CARE_PROVIDER_SITE_OTHER): Payer: Medicare Other | Admitting: Psychology

## 2012-11-17 ENCOUNTER — Other Ambulatory Visit (HOSPITAL_COMMUNITY): Payer: Self-pay | Admitting: Psychiatry

## 2012-11-17 DIAGNOSIS — F329 Major depressive disorder, single episode, unspecified: Secondary | ICD-10-CM

## 2012-11-17 MED ORDER — FLUOXETINE HCL 20 MG PO CAPS: 20.0000 mg | ORAL_CAPSULE | Freq: Every day | ORAL | Status: DC

## 2012-11-27 ENCOUNTER — Ambulatory Visit (HOSPITAL_COMMUNITY): Payer: Medicare Other | Admitting: Psychiatry

## 2013-01-14 ENCOUNTER — Ambulatory Visit (INDEPENDENT_AMBULATORY_CARE_PROVIDER_SITE_OTHER): Payer: Medicare Other | Admitting: Psychology

## 2013-01-14 ENCOUNTER — Encounter (HOSPITAL_COMMUNITY): Payer: Self-pay | Admitting: Psychology

## 2013-01-14 DIAGNOSIS — F329 Major depressive disorder, single episode, unspecified: Secondary | ICD-10-CM

## 2013-01-14 NOTE — Progress Notes (Signed)
Patient:  [name]   DOB: Mar 22, 1966  MR Number: [id number]  Location: BEHAVIORAL [facility] [address] [place] [address] Dept: [phone]  Start: 2 PM End: 3PM  Provider/Observer:     Hershal Coria PSYD  Chief Complaint:      Chief Complaint  Patient presents with  . Depression  . Anxiety  . Agitation    Reason For Service:     The patient was referred because of increasing symptoms of depression. Relationship issues, financial problems, medical issues, and loss of her brother in 2002 were all problematic situations for her to deal with. Most recently, major conflicts between her, her knees, and her daughter have also been quite disruptive. At this point, the patient has continued to struggle with severe back issues along with severe depression.  Interventions Strategy:  Cognitive/behavioral psychotherapy  Participation Level:   Active  Participation Quality:  Appropriate      Behavioral Observation:  Well Groomed, Alert, and Appropriate.   Current Psychosocial Factors: The patient has made major changes in her life and plans on more.  She and her mother are working on mother going to nursing home, which would allow patient, her youngest daughter and nephew the more to a smaller place away from other family members that are making so much problems for her.  Content of Session:   Reviewed current symptoms and continued work on therapeutic interventions  Current Status:   Patient continues to make good progress in "getting her act together" and mood improvements are following these changes.  Patient Progress:   Stable  Target Goals:   Target goals include reducing symptoms of depression, anxiety, and increase coping skills.  Last Reviewed:   01/14/2013  Goals Addressed Today:    Today we worked on adjusted issues with the dissolution of her relationship with  "PJ"  Impression/Diagnosis:   The patient has had significant psychosocial stressors throughout her life going on going back to childhood. Most recently she has been taking care of her g as well as her sister's children. Her sister died several years ago and she has raised her sister's children. She also adopted one of her sister's children her. The patient has her own daughter who has that was serious and significant substance abuse. The patient has recurrent major depressive events.randmother who had a severe stroke  Diagnosis:    Axis I:  Major depression      Axis II: No diagnosis

## 2013-01-15 ENCOUNTER — Other Ambulatory Visit (HOSPITAL_COMMUNITY): Payer: Self-pay | Admitting: Psychiatry

## 2013-01-16 ENCOUNTER — Other Ambulatory Visit (HOSPITAL_COMMUNITY): Payer: Self-pay | Admitting: Psychiatry

## 2013-01-16 NOTE — Telephone Encounter (Signed)
Please contact Dr walker

## 2013-01-19 ENCOUNTER — Ambulatory Visit (HOSPITAL_COMMUNITY): Payer: Self-pay | Admitting: Psychiatry

## 2013-01-21 ENCOUNTER — Ambulatory Visit (INDEPENDENT_AMBULATORY_CARE_PROVIDER_SITE_OTHER): Payer: Medicare Other | Admitting: Orthopedic Surgery

## 2013-01-21 VITALS — BP 122/84 | Ht 71.0 in | Wt 201.0 lb

## 2013-01-21 DIAGNOSIS — M1710 Unilateral primary osteoarthritis, unspecified knee: Secondary | ICD-10-CM

## 2013-01-21 DIAGNOSIS — M24469 Recurrent dislocation, unspecified knee: Secondary | ICD-10-CM

## 2013-01-21 DIAGNOSIS — M25562 Pain in left knee: Secondary | ICD-10-CM

## 2013-01-21 DIAGNOSIS — M25569 Pain in unspecified knee: Secondary | ICD-10-CM

## 2013-01-21 NOTE — Patient Instructions (Addendum)
You have received a steroid shot. 15% of patients experience increased pain at the injection site with in the next 24 hours. This is best treated with ice and tylenol extra strength 2 tabs every 8 hours. If you are still having pain please call the office.    

## 2013-01-21 NOTE — Progress Notes (Signed)
Patient ID: [name], female   DOB: [birth year], 47 y.o.   MRN: [id number] Chief Complaint  Patient presents with  . Knee Pain    Left knee pain    The patient requests injection in her left knee. She does note that she's been managed with chronic pain management.  Her knee is giving has anterior knee pain syndrome. She had a scope several years ago. She never really better.  To we will  inject the joint of the left knee Knee  Injection Procedure Note  Pre-operative Diagnosis: left knee oa  Post-operative Diagnosis: same  Indications: pain  Anesthesia: ethyl chloride   Procedure Details   Verbal consent was obtained for the procedure. Time out was completed.The joint was prepped with alcohol, followed by  Ethyl chloride spray and A 20 gauge needle was inserted into the knee via lateral approach; 4ml 1% lidocaine and 1 ml of depomedrol  was then injected into the joint . The needle was removed and the area cleansed and dressed.  Complications:  None; patient tolerated the procedure well.

## 2013-01-30 ENCOUNTER — Ambulatory Visit (INDEPENDENT_AMBULATORY_CARE_PROVIDER_SITE_OTHER): Payer: Medicare Other | Admitting: Psychiatry

## 2013-01-30 ENCOUNTER — Encounter (HOSPITAL_COMMUNITY): Payer: Self-pay | Admitting: Psychiatry

## 2013-01-30 VITALS — Wt 201.2 lb

## 2013-01-30 DIAGNOSIS — G894 Chronic pain syndrome: Secondary | ICD-10-CM

## 2013-01-30 DIAGNOSIS — F341 Dysthymic disorder: Secondary | ICD-10-CM

## 2013-01-30 DIAGNOSIS — F323 Major depressive disorder, single episode, severe with psychotic features: Secondary | ICD-10-CM

## 2013-01-30 MED ORDER — MELOXICAM 7.5 MG PO TABS: 7.5000 mg | ORAL_TABLET | Freq: Every day | ORAL | Status: DC

## 2013-01-30 MED ORDER — GABAPENTIN 300 MG PO CAPS: 300.0000 mg | ORAL_CAPSULE | Freq: Four times a day (QID) | ORAL | Status: DC

## 2013-01-30 MED ORDER — OMEPRAZOLE 40 MG PO CPDR: 40.0000 mg | DELAYED_RELEASE_CAPSULE | Freq: Every day | ORAL | Status: DC

## 2013-01-30 MED ORDER — DULOXETINE HCL 20 MG PO CPEP: 20.0000 mg | ORAL_CAPSULE | Freq: Two times a day (BID) | ORAL | Status: DC

## 2013-01-30 NOTE — Patient Instructions (Signed)
Could use "Move Free" or "Osteo bi Flex" for arthritic pain.   The important ingredients are Chondrotin Sulfate and Glucosamine.  Tumeric is also helpful for arthritis.   Krill oil and cod liver oil may be helpful for arthritis.   [employer] is a great source for all of these.  [phone]  Glori Luis is a mushroom that has the strongest antiinflammatory properties of any substance known to mankind.  Among other sources, it can be ordered from [facility].[email or web address]  Yoga is a very helpful exercise method.  On TV, on line, or by DVD Adelfa Koh is a source of high quality information about yoga and videos on yoga.  Renee Ramus is the world's number one video yoga instructor according to some experts.  There are exceptional health benefits that can be achieved through yoga.  The main principles of yoga is acceptance, no competition, no comparison, and no judgement.  It is exceptional in helping people meditate and get to a very relaxed state.   Positions that may be helpful for relaxing your back include monkey, plank and side plank.  Lying spinal twist is also very helpful.    Relaxation is the ultimate solution for you.  You can seek it through tub baths, bubble baths, essential oils or incense, walking or chatting with friends, listening to soft music, watching a candle burn and just letting all thoughts go and appreciating the true essence of the Creator.   Behold BEAUTY on your walks   Call if problems or concerns.
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
## 2013-02-05 ENCOUNTER — Ambulatory Visit (INDEPENDENT_AMBULATORY_CARE_PROVIDER_SITE_OTHER): Payer: Medicare Other | Admitting: Psychology

## 2013-02-05 DIAGNOSIS — F329 Major depressive disorder, single episode, unspecified: Secondary | ICD-10-CM

## 2013-02-06 ENCOUNTER — Telehealth (HOSPITAL_COMMUNITY): Payer: Self-pay | Admitting: Psychology

## 2013-02-19 ENCOUNTER — Ambulatory Visit (INDEPENDENT_AMBULATORY_CARE_PROVIDER_SITE_OTHER): Payer: Medicare Other | Admitting: Psychology

## 2013-02-19 DIAGNOSIS — F329 Major depressive disorder, single episode, unspecified: Secondary | ICD-10-CM

## 2013-02-20 ENCOUNTER — Encounter (HOSPITAL_COMMUNITY): Payer: Self-pay | Admitting: Psychology

## 2013-02-20 NOTE — Progress Notes (Signed)
Patient:  [name]   DOB: 16-Jul-1966  MR Number: [id number]  Location: BEHAVIORAL [facility] [address] [place] [address] Dept: [phone]  Start: 3 PM End: 4 PM  Provider/Observer:     Hershal Coria PSYD  Chief Complaint:      Chief Complaint  Patient presents with  . Depression  . Stress    Reason For Service:     The patient was referred because of increasing symptoms of depression. Relationship issues, financial problems, medical issues, and loss of her brother in 2002 were all problematic situations for her to deal with. Most recently, major conflicts between her, her knees, and her daughter have also been quite disruptive. At this point, the patient has continued to struggle with severe back issues along with severe depression.  Interventions Strategy:  Cognitive/behavioral psychotherapy  Participation Level:   Active  Participation Quality:  Appropriate      Behavioral Observation:  Well Groomed, Alert, and Appropriate.   Current Psychosocial Factors: The patient reports that she was able to get her sister, her knees, and her sister signed out of the house after I made a phone call with the patient's permission to her sister and let her no about 30 stress and Roxan Hockey posed by her sister and her sister's continued drug use in her sister's son coming to the house and living in the patient's mother's room and his constant masturbation and other problems. The patient reports that this has caused a lot of stress for her but it getting better. She reports her sister and the biological father her knees and has had almost nothing to do with his daughter for years have been posting slanderous things about the patient on face look and the patient has struggled but has been able to not respond to these slanderous comments. The sister is also been making repeated phone calls to one of his nieces  teachers requesting money and making false claims about the patient. The patient does continue to have her nephew living with her as he is extremely respectful to her helpful and not a disruptive force within the house.  Content of Session:   Reviewed current symptoms and continued work on therapeutic interventions  Current Status:   Patient continues to make good progress in "getting her act together" and mood improvements are following these changes.  Patient Progress:   Stable  Target Goals:   Target goals include reducing symptoms of depression, anxiety, and increase coping skills.  Last Reviewed:   02/19/2013  Goals Addressed Today:    Today we worked on adjusted issues with the dissolution of her relationship with "PJ"  Impression/Diagnosis:   The patient has had significant psychosocial stressors throughout her life going on going back to childhood. Most recently she has been taking care of her g as well as her sister's children. Her sister died several years ago and she has raised her sister's children. She also adopted one of her sister's children her. The patient has her own daughter who has that was serious and significant substance abuse. The patient has recurrent major depressive events.randmother who had a severe stroke  Diagnosis:    Axis I:  Major depression      Axis II: No diagnosis

## 2013-02-24 ENCOUNTER — Encounter: Payer: Self-pay | Admitting: Orthopedic Surgery

## 2013-02-26 ENCOUNTER — Telehealth: Payer: Self-pay | Admitting: Gastroenterology

## 2013-02-26 ENCOUNTER — Encounter (HOSPITAL_COMMUNITY): Payer: Self-pay | Admitting: Psychology

## 2013-02-26 MED ORDER — DICYCLOMINE HCL 10 MG PO CAPS: ORAL_CAPSULE | ORAL | Status: DC

## 2013-02-26 NOTE — Progress Notes (Signed)
Patient:  [name]   DOB: [birth year]  MR Number: [id number]  Location: BEHAVIORAL [facility] [address] [place] [address] Dept: [phone]  Start: 2 PM End: 3PM  Provider/Observer:     Hershal Coria PSYD  Chief Complaint:      Chief Complaint  Patient presents with  . Anxiety  . Depression  . Stress    Reason For Service:     The patient was referred because of increasing symptoms of depression. Relationship issues, financial problems, medical issues, and loss of her brother in 2002 were all problematic situations for her to deal with. Most recently, major conflicts between her, her knees, and her daughter have also been quite disruptive. At this point, the patient has continued to struggle with severe back issues along with severe depression.  Interventions Strategy:  Cognitive/behavioral psychotherapy  Participation Level:   Active  Participation Quality:  Appropriate      Behavioral Observation:  Well Groomed, Alert, and Appropriate.   Current Psychosocial Factors: The patient has made major changes in her life and plans on more.  She and her mother are working on mother going to nursing home, which would allow patient, her youngest daughter and nephew the more to a smaller place away from other family members that are making so much problems for her.  Content of Session:   Reviewed current symptoms and continued work on therapeutic interventions  Current Status:   Patient continues to make good progress in "getting her act together" and mood improvements are following these changes.  Patient Progress:   Stable  Target Goals:   Target goals include reducing symptoms of depression, anxiety, and increase coping skills.  Last Reviewed:   02/05/2013  Goals Addressed Today:    Today we worked on adjusted issues with the dissolution of her relationship with  "PJ"  Impression/Diagnosis:   The patient has had significant psychosocial stressors throughout her life going on going back to childhood. Most recently she has been taking care of her g as well as her sister's children. Her sister died several years ago and she has raised her sister's children. She also adopted one of her sister's children her. The patient has her own daughter who has that was serious and significant substance abuse. The patient has recurrent major depressive events.randmother who had a severe stroke  Diagnosis:    Axis I:  Major depressive disorder      Axis II: No diagnosis

## 2013-02-26 NOTE — Telephone Encounter (Signed)
Patient called to make OV to further her refills and is aware of OV for 4/28 at 2 with AS. She asked if we could call in her Rx for dicyclomine to [facility] in [place] to hold her over until her appointment. Please advise [phone]

## 2013-02-26 NOTE — Telephone Encounter (Signed)
Done

## 2013-02-27 ENCOUNTER — Encounter (HOSPITAL_COMMUNITY): Payer: Self-pay | Admitting: Psychiatry

## 2013-02-27 NOTE — Telephone Encounter (Signed)
Called and informed pt.  

## 2013-02-27 NOTE — Progress Notes (Signed)
This encounter was created in error - please disregard.

## 2013-03-13 ENCOUNTER — Ambulatory Visit (INDEPENDENT_AMBULATORY_CARE_PROVIDER_SITE_OTHER): Payer: Medicare Other | Admitting: Psychology

## 2013-03-13 DIAGNOSIS — F329 Major depressive disorder, single episode, unspecified: Secondary | ICD-10-CM

## 2013-03-13 DIAGNOSIS — G894 Chronic pain syndrome: Secondary | ICD-10-CM

## 2013-03-16 ENCOUNTER — Ambulatory Visit (INDEPENDENT_AMBULATORY_CARE_PROVIDER_SITE_OTHER): Payer: Medicare Other | Admitting: Gastroenterology

## 2013-03-16 ENCOUNTER — Encounter: Payer: Self-pay | Admitting: Gastroenterology

## 2013-03-16 VITALS — BP 139/95 | HR 98 | Temp 97.6°F | Ht 71.0 in | Wt 208.0 lb

## 2013-03-16 DIAGNOSIS — R1013 Epigastric pain: Secondary | ICD-10-CM

## 2013-03-16 DIAGNOSIS — K589 Irritable bowel syndrome without diarrhea: Secondary | ICD-10-CM

## 2013-03-16 DIAGNOSIS — K3189 Other diseases of stomach and duodenum: Secondary | ICD-10-CM

## 2013-03-16 MED ORDER — HYOSCYAMINE SULFATE 0.125 MG SL SUBL: 0.1250 mg | SUBLINGUAL_TABLET | Freq: Three times a day (TID) | SUBLINGUAL | Status: DC

## 2013-03-16 MED ORDER — PANTOPRAZOLE SODIUM 40 MG PO TBEC: 40.0000 mg | DELAYED_RELEASE_TABLET | Freq: Every day | ORAL | Status: DC

## 2013-03-16 NOTE — Patient Instructions (Addendum)
Stop Prilosec. Start Protonix each morning, 30 minutes before breakfast. Call if no improvement in the next 2 weeks.   Instead of Bentyl, trial of Levsin before meals and at bedtime. This has been sent to the pharmacy as well. It may need an authorization before being processed; your pharmacy will let us know if this is the case.   You may take imodium each morning as needed. Avoid if you are constipated. Try almond milk instead of regular milk with your cereal.  We will see you back in 3 months or sooner if needed.

## 2013-03-16 NOTE — Progress Notes (Signed)
Referring Provider: Kirstie Peri, MD Primary Care Physician:  Kirstie Peri, MD Primary GI: Dr. Jena Gauss   Chief Complaint  Patient presents with  . Medication Refill    HPI:   [name] is a 47 year old female presenting today in routine follow-up for a history of IBS and GERD. Last seen May 2012 by Dr. Darrick Penna. However, her primary GI is Dr. Jena Gauss. It is unclear how she was switched, and she would like to continue care with Dr. Jena Gauss.  Last colonoscopy in Dec 2013 by Dr. Teena Dunk. She has done well with Levsin in the past; however, insurance denied this previously. She would like to resubmit again .  States at times she will feel constipated but knows she isn't. Sits on the toilet, breaks out in a sweat, nausea. Postprandial urgency. Feels like it is getting worse. Trying to lose weight. Diabetic but not on insulin. Notes abdominal bloating. Certain foods now triggering that didn't used to: hot dogs. Loves dairy products. Sometimes tolerates, sometimes not. No rectal bleeding. Colonoscopy by Dr. Teena Dunk in Dec 2013.   Notes epigastric discomfort intermittently, states sometimes swollen, no association with anything. Taking Prilosec 40 mg daily. Lasts about a day. Not associated with loose stools. Hasn't tried any other medications.   Past Medical History  Diagnosis Date  . Depression   . Diabetes mellitus   . LBP (low back pain)   . BMI (body mass index) 20.0-29.9 2008 192 lbs  . GERD (gastroesophageal reflux disease) 2002    202 lbs  . Irritable bowel syndrome 2008    diarrhea predominant MEDS TRIED: BENTYL, LEVBID  . RUQ abdominal pain 2004 HIDA NL CTA w/ IVC-FATTY LIVER, ANGIOMYOLIPOMA LEFT KIDNEY    2008 HIDA NL    Past Surgical History  Procedure Laterality Date  . Total abdominal hysterectomy  2000 fibroids/DUB  . Shoulder surgery  1997 RIGHT  . Breast reduction surgery  2006  . Lumbar disc surgery  2005  . Ankle surgery  RIGHT  . Colonoscopy  2008 RMR    PAN-COLONIC TICS, NL TI   . Upper gastrointestinal endoscopy  2008 RMR    SML HH o/w NL    Current Outpatient Prescriptions  Medication Sig Dispense Refill  . ALPRAZolam (XANAX) 0.5 MG tablet Take 0.5 mg by mouth at bedtime as needed.        . cetirizine (ZYRTEC) 10 MG tablet Take 10 mg by mouth daily.        Marland Kitchen dicyclomine (BENTYL) 10 MG capsule TAKE 1-2 CAPSULES 30 MINUTES PRIOR TO MEALS AND AT BEDTIME.  120 capsule  0  . DIOVAN HCT 160-25 MG per tablet Take 1 tablet by mouth daily.       . DULoxetine (CYMBALTA) 20 MG capsule Take 1 capsule (20 mg total) by mouth 2 (two) times daily.  60 capsule  2  . furosemide (LASIX) 20 MG tablet Take 20 mg by mouth 2 (two) times daily.        Marland Kitchen LYRICA 75 MG capsule Take 75 mg by mouth 2 (two) times daily.       . metFORMIN (GLUMETZA) 500 MG (MOD) 24 hr tablet Take 500 mg by mouth daily with breakfast.        . methylPREDNISolone acetate (DEPO-MEDROL) 40 MG/ML injection Inject 1 mL (40 mg total) into the articular space once.  1 mL  0  . metoprolol (LOPRESSOR) 100 MG tablet Take 100 mg by mouth 2 (two) times daily.       Marland Kitchen  Multiple Vitamin (MULTIVITAMIN) capsule Take 1 capsule by mouth daily.        Marland Kitchen omeprazole (PRILOSEC) 40 MG capsule Take 1 capsule (40 mg total) by mouth daily.  30 capsule  1  . oxyCODONE (ROXICODONE) 15 MG immediate release tablet Take 15 mg by mouth every 8 (eight) hours as needed.       . QC PEN NEEDLES 31G X 6 MM MISC       . topiramate (TOPAMAX) 25 MG tablet Take 1 tablet (25 mg total) by mouth daily.  30 tablet  1  . Vitamin D, Ergocalciferol, (DRISDOL) 50000 UNITS CAPS Take 50,000 Units by mouth.        . hyoscyamine (LEVSIN/SL) 0.125 MG SL tablet Place 1 tablet (0.125 mg total) under the tongue 4 (four) times daily -  before meals and at bedtime.  120 tablet  3  . pantoprazole (PROTONIX) 40 MG tablet Take 1 tablet (40 mg total) by mouth daily.  30 tablet  3   No current facility-administered medications for this visit.    Allergies as of  03/16/2013  . (No Known Allergies)    Family History  Problem Relation Age of Onset  . Breast cancer Maternal Aunt   . Colon polyps Neg Hx   . Colon cancer Neg Hx     History   Social History  . Marital Status: Single    Spouse Name: N/A    Number of Children: N/A  . Years of Education: N/A   Social History Main Topics  . Smoking status: Current Every Day Smoker -- 0.50 packs/day    Types: Cigarettes  . Smokeless tobacco: None  . Alcohol Use: No  . Drug Use: No  . Sexually Active: None   Other Topics Concern  . None   Social History Narrative  . None    Review of Systems: Negative unless mentioned in HPI  Physical Exam: BP 139/95  Pulse 98  Temp(Src) 97.6 F (36.4 C) (Oral)  Ht 5\' 11"  (1.803 m)  Wt 208 lb (94.348 kg)  BMI 29.02 kg/m2 General:   Alert and oriented. No distress noted. Pleasant and cooperative.  Head:  Normocephalic and atraumatic. Eyes:  Conjuctiva clear without scleral icterus. Mouth:  Oral mucosa pink and moist. Good dentition. No lesions. Neck:  Supple, without mass or thyromegaly. Heart:  S1, S2 present without murmurs, rubs, or gallops. Regular rate and rhythm. Abdomen:  +BS, soft, non-tender and non-distended. No rebound or guarding. No HSM or masses noted. Msk:  Symmetrical without gross deformities. Normal posture. Extremities:  Without edema. Neurologic:  Alert and  oriented x4;  grossly normal neurologically. Skin:  Intact without significant lesions or rashes. Cervical Nodes:  No significant cervical adenopathy. Psych:  Alert and cooperative. Normal mood and affect.

## 2013-03-17 DIAGNOSIS — R1013 Epigastric pain: Secondary | ICD-10-CM | POA: Insufficient documentation

## 2013-03-17 NOTE — Assessment & Plan Note (Signed)
New onset but no association with eating, bowel movements. No aggravating or relieving factors. Already on Prilosec 40 mg daily. Switch to Protonix, and consider EGD with RMR if no improvement in the next few weeks. I believe gallbladder remains in situ; therefore, unable to rule out underlying biliary dyskinesia.

## 2013-03-17 NOTE — Assessment & Plan Note (Signed)
Chronic history of IBS, last colonoscopy in Dec 2013 by Dr. Teena Dunk. We will obtain for our records. She has done well with Levsin in the past, less than ideal with Bentyl. Postprandial urgency noted, with worsening secondary to food choices (i.e. Hot dogs, dairy). Discussed dietary modification, restart Levsin. May take imodium prn. Return in 3 months or sooner if needed.

## 2013-03-17 NOTE — Progress Notes (Signed)
Cc PCP 

## 2013-03-24 ENCOUNTER — Ambulatory Visit (INDEPENDENT_AMBULATORY_CARE_PROVIDER_SITE_OTHER): Payer: Medicare Other | Admitting: Psychiatry

## 2013-03-24 ENCOUNTER — Encounter (HOSPITAL_COMMUNITY): Payer: Self-pay | Admitting: Psychiatry

## 2013-03-24 VITALS — BP 135/96 | HR 80 | Ht 71.0 in | Wt 207.0 lb

## 2013-03-24 DIAGNOSIS — G894 Chronic pain syndrome: Secondary | ICD-10-CM

## 2013-03-24 DIAGNOSIS — F323 Major depressive disorder, single episode, severe with psychotic features: Secondary | ICD-10-CM

## 2013-03-24 DIAGNOSIS — F341 Dysthymic disorder: Secondary | ICD-10-CM

## 2013-03-24 MED ORDER — DULOXETINE HCL 20 MG PO CPEP: 20.0000 mg | ORAL_CAPSULE | Freq: Two times a day (BID) | ORAL | Status: DC
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
## 2013-03-24 NOTE — Patient Instructions (Signed)
Set a timer for 8 or a certain number minutes and walk for that amount of time in the house or in the yard.  Mark the number of minutes on a calendar for that day.  Do that every day this week.  Then next week increase the time by 1 minutes and then mark the calendar with the number of minutes for that day.  Each week increase your exercise by one minute.  Keep a record of this so you can see the progress you are making.  Do this every day, just like eating and sleeping.  It is good for pain control, depression, and for your soul/spirit.  Bring the record in for your next visit so we can talk about your effort and how you feel with the new exercise program going and working for you.  Take care of yourself.  No one else is standing up to do the job and only you know what you need.   GET SERIOUS about taking care of yourself.  Do the next right thing and that often means doing something to care for yourself along the lines of are you hungry, are you angry, are you lonely, are you tired, are you scared?  [employer] is what that stands for.  Call if problems or concerns.

## 2013-03-31 ENCOUNTER — Encounter (HOSPITAL_COMMUNITY): Payer: Self-pay | Admitting: Psychology

## 2013-03-31 NOTE — Progress Notes (Signed)
Patient:  [name]   DOB: [birth year]  MR Number: [id number]  Location: BEHAVIORAL [facility] [address] [place] [address] Dept: [phone]  Start: 3 PM End: 4 PM  Provider/Observer:     Hershal Coria PSYD  Chief Complaint:      Chief Complaint  Patient presents with  . Depression  . Stress  . Agitation    Reason For Service:     The patient was referred because of increasing symptoms of depression. Relationship issues, financial problems, medical issues, and loss of her brother in 2002 were all problematic situations for her to deal with. Most recently, major conflicts between her, her knees, and her daughter have also been quite disruptive. At this point, the patient has continued to struggle with severe back issues along with severe depression.  Interventions Strategy:  Cognitive/behavioral psychotherapy  Participation Level:   Active  Participation Quality:  Appropriate      Behavioral Observation:  Well Groomed, Alert, and Appropriate.   Current Psychosocial Factors: The patient reports that her house/home life has been much improved and that her sister, adopted daughter and all of the others that were living there and causing trouble have been away. She reports that he attempted her sister made to try to do stairs the patient for making the move out have stopped or if she least has not been told about it from others. The patient reports that her mother is doing much better with change in the family situation is no longer being critical of the patient for making the patient's sister move out.  Content of Session:   Reviewed current symptoms and continued work on therapeutic interventions  Current Status:   Patient continues to make good progress in "getting her act together" and mood improvements are following these changes.  Patient Progress:   Stable  Target Goals:   Target goals  include reducing symptoms of depression, anxiety, and increase coping skills.  Last Reviewed:   4/25 2014  Goals Addressed Today:    Today we worked on adjusted issues with the dissolution of her relationship with "PJ"  Impression/Diagnosis:   The patient has had significant psychosocial stressors throughout her life going on going back to childhood. Most recently she has been taking care of her g as well as her sister's children. Her sister died several years ago and she has raised her sister's children. She also adopted one of her sister's children her. The patient has her own daughter who has that was serious and significant substance abuse. The patient has recurrent major depressive events.randmother who had a severe stroke  Diagnosis:    Axis I:  Major depressive disorder  Pain syndrome, chronic      Axis II: No diagnosis

## 2013-04-03 ENCOUNTER — Ambulatory Visit (HOSPITAL_COMMUNITY): Payer: Self-pay | Admitting: Psychology

## 2013-04-17 ENCOUNTER — Other Ambulatory Visit: Payer: Self-pay | Admitting: Gastroenterology

## 2013-04-20 NOTE — Telephone Encounter (Signed)
I gave her Levsin at her appt. She needs to take levsin instead of Bentyl.

## 2013-04-22 NOTE — Telephone Encounter (Signed)
See Annas recommendation.

## 2013-05-26 ENCOUNTER — Ambulatory Visit (INDEPENDENT_AMBULATORY_CARE_PROVIDER_SITE_OTHER): Payer: Medicare Other | Admitting: Orthopedic Surgery

## 2013-05-26 ENCOUNTER — Ambulatory Visit (INDEPENDENT_AMBULATORY_CARE_PROVIDER_SITE_OTHER): Payer: Medicare Other

## 2013-05-26 DIAGNOSIS — M25521 Pain in right elbow: Secondary | ICD-10-CM

## 2013-05-26 DIAGNOSIS — M25529 Pain in unspecified elbow: Secondary | ICD-10-CM | POA: Insufficient documentation

## 2013-05-26 DIAGNOSIS — M719 Bursopathy, unspecified: Secondary | ICD-10-CM

## 2013-05-26 DIAGNOSIS — G243 Spasmodic torticollis: Secondary | ICD-10-CM

## 2013-05-26 DIAGNOSIS — M67919 Unspecified disorder of synovium and tendon, unspecified shoulder: Secondary | ICD-10-CM

## 2013-05-26 DIAGNOSIS — M75101 Unspecified rotator cuff tear or rupture of right shoulder, not specified as traumatic: Secondary | ICD-10-CM | POA: Insufficient documentation

## 2013-05-26 DIAGNOSIS — S4991XA Unspecified injury of right shoulder and upper arm, initial encounter: Secondary | ICD-10-CM

## 2013-05-26 DIAGNOSIS — S4980XA Other specified injuries of shoulder and upper arm, unspecified arm, initial encounter: Secondary | ICD-10-CM

## 2013-05-26 DIAGNOSIS — S46909A Unspecified injury of unspecified muscle, fascia and tendon at shoulder and upper arm level, unspecified arm, initial encounter: Secondary | ICD-10-CM

## 2013-05-26 MED ORDER — BACLOFEN 10 MG PO TABS: 10.0000 mg | ORAL_TABLET | Freq: Three times a day (TID) | ORAL | Status: DC

## 2013-05-26 NOTE — Patient Instructions (Addendum)
Start therapy for C-spine  You have received a steroid shot. 15% of patients experience increased pain at the injection site with in the next 24 hours. This is best treated with ice and tylenol extra strength 2 tabs every 8 hours. If you are still having pain please call the office.   C spine MRI open unit

## 2013-05-27 ENCOUNTER — Encounter: Payer: Self-pay | Admitting: Orthopedic Surgery

## 2013-05-27 NOTE — Progress Notes (Signed)
Patient ID: [name], female   DOB: 1966-10-18, 47 y.o.   MRN: [id number] Chief Complaint  Patient presents with  . Shoulder Pain    Right shoulder pain times a month  . Torticollis    Neck stiffness x4 years    History: This patient had a right rotator cuff arthroscopy several years ago followed by open surgery for decompression without rotator cuff tear did well until about a month ago when she had sudden onset of right shoulder pain which is described as 10 out of 10 despite oxycodone 15 mg. The pain is sharp throbbing stabbing aching dull deep associated with numbness and tingling in her right hand and it has become constant. There is catching and in no way is that she hears in the shoulder which described as grinding.  She's had a stiff neck for several years she had a Botox injection no x-rays she has not tried any physical therapy.  As far as the shoulder goes she tried Voltaren gel and Lidoderm patch without any improvement  Her review of systems is notable for heart murmur and heartburn diarrhea numbness and tingling as described anxiety and depression seasonal allergies. Musculoskeletal findings noted. She denies unexpected weight loss blurred vision chest pain shortness of breath painful urination skin rashes easy bruising or excessive thirst  She has no known drug allergies  His medical history of hypertension, anxiety, depression, diabetes, fibromyalgia, degenerative disc disease lumbar spine.  She's had surgery for flatfoot deformity, arthroscopy right shoulder, open decompression right shoulder. She had left knee arthroscopy.  She's followed by Dr. Clelia Croft  Her medications are Bentyl 10 mg, Lyrica 75 mg, Lipitor 10 mg pantoprazole 40 mg Prozac 20 mg Xanax 0.5 mg oxycodone 15 mg victoza 0.6 mg  General appearance is normal she is oriented x3 she is a pleasant mood she amylase with no assistive devices and a limp  Her cervical spine is tender especially on the left side  there appears to be increased mass to the trapezius muscle. Range of motion in the shoulder is normal but the neck is slightly tilted to the right. No instability. Increased muscle tone. Skin normal.  Left shoulder full range of motion no instability strength normal skin normal no tenderness  Right shoulder painful crepitance noted decreased range of motion stability tests are normal rotator cuff was intact skin was intact. Acromial tenderness  Upper extremities normal pulse and perfusion lymph nodes are negative sensation is normal with normal reflexes.  X-rays of the neck show no abnormalities other than a slight loss of cervical lordosis and the tilt noted to the right is noted on x-ray.  Right shoulder normal  Recommend subacromial injection right shoulder  Physical therapy cervical spine  MRI cervical spine to evaluate for years of torticollis  Encounter Diagnoses  Name Primary?  . Pain in joint, upper arm, right   . Torticollis, spasmodic   . Rotator cuff syndrome of right shoulder   . Right shoulder injury, initial encounter Yes     Shoulder Injection Procedure Note   Pre-operative Diagnosis: right  RC Syndrome  Post-operative Diagnosis: same  Indications: pain   Anesthesia: ethyl chloride   Procedure Details   Verbal consent was obtained for the procedure. The shoulder was prepped withalcohol and the skin was anesthetized. A 20 gauge needle was advanced into the subacromial space through posterior approach without difficulty  The space was then injected with 3 ml 1% lidocaine and 1 ml of depomedrol. The injection site was  cleansed with isopropyl alcohol and a dressing was applied.  Complications:  None; patient tolerated the procedure well.

## 2013-06-04 ENCOUNTER — Ambulatory Visit (INDEPENDENT_AMBULATORY_CARE_PROVIDER_SITE_OTHER): Payer: Medicare Other | Admitting: Psychology

## 2013-06-04 DIAGNOSIS — F329 Major depressive disorder, single episode, unspecified: Secondary | ICD-10-CM

## 2013-06-05 ENCOUNTER — Encounter (HOSPITAL_COMMUNITY): Payer: Self-pay | Admitting: Psychology
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
## 2013-06-11 ENCOUNTER — Other Ambulatory Visit: Payer: Self-pay | Admitting: *Deleted

## 2013-06-11 DIAGNOSIS — M25511 Pain in right shoulder: Secondary | ICD-10-CM

## 2013-06-11 MED ORDER — CARISOPRODOL 350 MG PO TABS: 350.0000 mg | ORAL_TABLET | Freq: Four times a day (QID) | ORAL | Status: DC

## 2013-06-11 MED ORDER — PREDNISONE 10 MG PO TABS: 10.0000 mg | ORAL_TABLET | Freq: Every day | ORAL | Status: DC

## 2013-06-15 ENCOUNTER — Other Ambulatory Visit: Payer: Self-pay | Admitting: Gastroenterology

## 2013-06-24 ENCOUNTER — Ambulatory Visit (HOSPITAL_COMMUNITY): Payer: Self-pay | Admitting: Psychiatry

## 2013-06-29 ENCOUNTER — Ambulatory Visit (HOSPITAL_COMMUNITY): Payer: Self-pay | Admitting: Psychiatry

## 2013-07-06 ENCOUNTER — Ambulatory Visit (HOSPITAL_COMMUNITY): Payer: Self-pay | Admitting: Psychology

## 2013-07-07 ENCOUNTER — Encounter: Payer: Self-pay | Admitting: Orthopedic Surgery

## 2013-07-07 ENCOUNTER — Ambulatory Visit (INDEPENDENT_AMBULATORY_CARE_PROVIDER_SITE_OTHER): Payer: Medicare Other | Admitting: Orthopedic Surgery

## 2013-07-07 VITALS — BP 150/100 | Ht 71.0 in | Wt 201.0 lb

## 2013-07-07 DIAGNOSIS — M75101 Unspecified rotator cuff tear or rupture of right shoulder, not specified as traumatic: Secondary | ICD-10-CM

## 2013-07-07 DIAGNOSIS — G243 Spasmodic torticollis: Secondary | ICD-10-CM

## 2013-07-07 DIAGNOSIS — M67919 Unspecified disorder of synovium and tendon, unspecified shoulder: Secondary | ICD-10-CM

## 2013-07-07 NOTE — Progress Notes (Signed)
Patient ID: [name], female   DOB: June 05, 1966, 47 y.o.   MRN: [id number]  Chief Complaint  Patient presents with  . Follow-up    6 week recheck right shoulder mri denied   and cervical torticollis   Mri needs to be ordered   No change in neck torticollis; tried therapy , steroids, muscle relaxer, injection     Current Outpatient Prescriptions on File Prior to Visit  Medication Sig Dispense Refill  . ALPRAZolam (XANAX) 0.5 MG tablet Take 0.5 mg by mouth at bedtime as needed.        . baclofen (LIORESAL) 10 MG tablet Take 1 tablet (10 mg total) by mouth 3 (three) times daily.  60 each  0  . carisoprodol (SOMA) 350 MG tablet Take 1 tablet (350 mg total) by mouth every 6 (six) hours.  56 tablet  0  . cetirizine (ZYRTEC) 10 MG tablet Take 10 mg by mouth daily.        Marland Kitchen dicyclomine (BENTYL) 10 MG capsule TAKE 1-2 CAPSULES 30 MINUTES PRIOR TO MEALS AND AT BEDTIME.  120 capsule  0  . DIOVAN HCT 160-25 MG per tablet Take 1 tablet by mouth daily.       . DULoxetine (CYMBALTA) 20 MG capsule Take 1 capsule (20 mg total) by mouth 2 (two) times daily. TWICE A DAY for anxiety and pain from fibromyalgia and other pain.  60 capsule  2  . furosemide (LASIX) 20 MG tablet Take 20 mg by mouth 2 (two) times daily.        . hyoscyamine (LEVSIN/SL) 0.125 MG SL tablet Place 1 tablet (0.125 mg total) under the tongue 4 (four) times daily -  before meals and at bedtime.  120 tablet  3  . LYRICA 75 MG capsule Take 75 mg by mouth 2 (two) times daily.       . metFORMIN (GLUMETZA) 500 MG (MOD) 24 hr tablet Take 500 mg by mouth daily with breakfast.        . methylPREDNISolone acetate (DEPO-MEDROL) 40 MG/ML injection Inject 1 mL (40 mg total) into the articular space once.  1 mL  0  . metoprolol (LOPRESSOR) 100 MG tablet Take 100 mg by mouth 2 (two) times daily.       . Multiple Vitamin (MULTIVITAMIN) capsule Take 1 capsule by mouth daily.        Marland Kitchen omeprazole (PRILOSEC) 40 MG capsule Take 1 capsule (40 mg total)  by mouth daily.  30 capsule  1  . oxyCODONE (ROXICODONE) 15 MG immediate release tablet Take 15 mg by mouth every 8 (eight) hours as needed.       . pantoprazole (PROTONIX) 40 MG tablet TAKE (1) TABLET BY MOUTH ONCE DAILY.  30 tablet  3  . predniSONE (DELTASONE) 10 MG tablet Take 1 tablet (10 mg total) by mouth daily.  60 tablet  0  . QC PEN NEEDLES 31G X 6 MM MISC       . topiramate (TOPAMAX) 25 MG tablet Take 1 tablet (25 mg total) by mouth daily.  30 tablet  1  . Vitamin D, Ergocalciferol, (DRISDOL) 50000 UNITS CAPS Take 50,000 Units by mouth.         No current facility-administered medications on file prior to visit.    Set up mri

## 2013-07-07 NOTE — Patient Instructions (Addendum)
MRI CSPINE  

## 2013-07-16 ENCOUNTER — Telehealth: Payer: Self-pay | Admitting: Orthopedic Surgery

## 2013-07-16 NOTE — Telephone Encounter (Signed)
Waiting on medicaid to approve MRI

## 2013-07-17 ENCOUNTER — Other Ambulatory Visit (HOSPITAL_COMMUNITY): Payer: Self-pay | Admitting: Psychiatry

## 2013-07-21 ENCOUNTER — Ambulatory Visit: Payer: Medicare Other | Admitting: Orthopedic Surgery

## 2013-07-23 ENCOUNTER — Ambulatory Visit: Payer: Medicare Other | Admitting: Orthopedic Surgery

## 2013-07-27 ENCOUNTER — Telehealth: Payer: Self-pay | Admitting: *Deleted

## 2013-07-27 NOTE — Telephone Encounter (Signed)
MRI was approved by [facility] Medicare complete after patient had completed requirements for treatment. Authorization # [id number] good from 07/09/13 - 08/23/2013. Patient was scheduled for 08/01/13 at 3:15 pm to arrive by 2:45 pm at [facility]. Patient was informed of date and time. States she is in [place] with her nephew and may have to change it, so no follow up appointment was made. She is to call our office to schedule follow up appointment.

## 2013-08-01 ENCOUNTER — Ambulatory Visit
Admission: RE | Admit: 2013-08-01 | Discharge: 2013-08-01 | Disposition: A | Discharge status: Home / Self Care | Payer: Medicare Other | Source: Ambulatory Visit | Attending: Orthopedic Surgery | Admitting: Orthopedic Surgery

## 2013-08-01 DIAGNOSIS — S4991XA Unspecified injury of right shoulder and upper arm, initial encounter: Secondary | ICD-10-CM

## 2013-08-03 ENCOUNTER — Ambulatory Visit (INDEPENDENT_AMBULATORY_CARE_PROVIDER_SITE_OTHER): Payer: Medicare Other | Admitting: Psychology

## 2013-08-03 DIAGNOSIS — G894 Chronic pain syndrome: Secondary | ICD-10-CM

## 2013-08-03 DIAGNOSIS — F329 Major depressive disorder, single episode, unspecified: Secondary | ICD-10-CM

## 2013-08-05 ENCOUNTER — Other Ambulatory Visit: Payer: Self-pay | Admitting: Gastroenterology

## 2013-08-05 ENCOUNTER — Telehealth: Payer: Self-pay | Admitting: Orthopedic Surgery

## 2013-08-05 NOTE — Telephone Encounter (Signed)
[name] asked if you will call her with her MRI results.   Her # [phone]

## 2013-08-06 ENCOUNTER — Other Ambulatory Visit: Payer: Self-pay | Admitting: Gastroenterology

## 2013-08-06 NOTE — Telephone Encounter (Signed)
Patient informed of Dr. Harrison's reply. 

## 2013-08-06 NOTE — Telephone Encounter (Signed)
Ok to call her results to her   She has a bulging disk  No further treatment recommended

## 2013-08-10 ENCOUNTER — Telehealth: Payer: Self-pay | Admitting: *Deleted

## 2013-08-10 ENCOUNTER — Other Ambulatory Visit: Payer: Self-pay | Admitting: *Deleted

## 2013-08-10 DIAGNOSIS — IMO0002 Reserved for concepts with insufficient information to code with codable children: Secondary | ICD-10-CM

## 2013-08-10 NOTE — Telephone Encounter (Signed)
Patient called and is requesting to see a neurosurgeon for her mild bulging disc C5-6. She asked if I would send her records from our office. I faxed her records to [facility], per patient's request.

## 2013-08-13 ENCOUNTER — Telehealth: Payer: Self-pay | Admitting: *Deleted

## 2013-08-13 NOTE — Telephone Encounter (Signed)
Appointment with Dr. Jeral Fruit 08/17/13 at 11:00 am. Patient is aware of appointment.

## 2013-09-02 ENCOUNTER — Ambulatory Visit (INDEPENDENT_AMBULATORY_CARE_PROVIDER_SITE_OTHER): Payer: Medicare Other | Admitting: Psychology

## 2013-09-02 DIAGNOSIS — G894 Chronic pain syndrome: Secondary | ICD-10-CM

## 2013-09-02 DIAGNOSIS — F329 Major depressive disorder, single episode, unspecified: Secondary | ICD-10-CM

## 2013-09-09 ENCOUNTER — Other Ambulatory Visit: Payer: Self-pay | Admitting: Gastroenterology

## 2013-09-09 ENCOUNTER — Other Ambulatory Visit (HOSPITAL_COMMUNITY): Payer: Self-pay | Admitting: Psychiatry

## 2013-09-16 ENCOUNTER — Ambulatory Visit (INDEPENDENT_AMBULATORY_CARE_PROVIDER_SITE_OTHER): Payer: Medicare Other | Admitting: Diagnostic Neuroimaging

## 2013-09-16 ENCOUNTER — Encounter (INDEPENDENT_AMBULATORY_CARE_PROVIDER_SITE_OTHER): Payer: Self-pay

## 2013-09-16 ENCOUNTER — Encounter: Payer: Self-pay | Admitting: Diagnostic Neuroimaging

## 2013-09-16 VITALS — BP 124/85 | HR 71 | Temp 98.1°F | Ht 71.0 in | Wt 212.0 lb

## 2013-09-16 DIAGNOSIS — G43009 Migraine without aura, not intractable, without status migrainosus: Secondary | ICD-10-CM | POA: Insufficient documentation

## 2013-09-16 DIAGNOSIS — M436 Torticollis: Secondary | ICD-10-CM

## 2013-09-16 MED ORDER — TOPIRAMATE 50 MG PO TABS: 50.0000 mg | ORAL_TABLET | Freq: Two times a day (BID) | ORAL | Status: DC

## 2013-09-16 MED ORDER — RIZATRIPTAN BENZOATE 10 MG PO TBDP: 10.0000 mg | ORAL_TABLET | ORAL | Status: DC | PRN

## 2013-09-16 NOTE — Progress Notes (Signed)
[facility]  PATIENT: [name] DOB: [birth year]  REFERRING CLINICIAN:  HISTORY FROM: patient REASON FOR VISIT: follow up   HISTORICAL  CHIEF COMPLAINT:  Chief Complaint  Patient presents with  . Follow-up    head and neck pain    HISTORY OF PRESENT ILLNESS:   UPDATE 09/17/13: And patient returns for further evaluation of headaches and increasing neck pain. Patient has headache associated with nausea, photophobia and blurred vision. She also has left-sided neck pain. She has felt her head turning towards the right sided tilting towards the right side. She has even resorted to using a neck tie, height around her head, attached to the wall and using this to stretch her neck towards the left side.   Now patient tells me that she was diagnosed with torticollis several years ago and treated with Botox injections by a neurologist in [place], [place]. Patient had MRI of the neck last month which showed minimal disc bulging but no surgically treatable lesion.  UPDATE 03/03/12: Doing about the same. Lots of stress, still smoking. Tried TPX, but not helping.   PRIOR HPI: 47 year old female with history of depression, anxiety, fibromyalgia, hypertension, diabetes, here for evaluation of headaches, pain and eye twitching.  For the past 3 months, patient has had right temporal headaches, intermittent right occipital tenderness to palpation with radiation to the right eye, intermittent facial numbness, intermittent slurred speech, intermittent blurred vision and right eye twitching.  No nausea or vomiting.  She has some photophobia.  She has tried Maxalt without relief of symptoms.  She has daily symptoms without relief.  She does not take any medications specifically for these headache symptoms.  She is on Lyrica for fibromyalgia and Percocet for back pain.  REVIEW OF SYSTEMS: Full 14 system review of systems performed and notable only for blurred vision joint pain neck  pain memory loss headache numbness weakness restless legs depression anxiety.  ALLERGIES: Allergies  Allergen Reactions  . Neurontin [Gabapentin] Other (See Comments)    Out of it and could not function in this while on all the other bp meds.    HOME MEDICATIONS: Outpatient Prescriptions Prior to Visit  Medication Sig Dispense Refill  . ALPRAZolam (XANAX) 0.5 MG tablet Take 0.5 mg by mouth at bedtime as needed.        . cetirizine (ZYRTEC) 10 MG tablet Take 10 mg by mouth daily.        Marland Kitchen dicyclomine (BENTYL) 10 MG capsule TAKE 1-2 CAPSULES 30 MINTUES PRIOR TO MEALS AND AT BEDTIME.  120 capsule  5  . DIOVAN HCT 160-25 MG per tablet Take 1 tablet by mouth daily.       . DULoxetine (CYMBALTA) 20 MG capsule TAKE 1 CAPSULE TWICE DAILY FOR ANXIETY AND PAIN FROM [facility] AND OTHER PAIN.  60 capsule  0  . furosemide (LASIX) 20 MG tablet Take 20 mg by mouth 2 (two) times daily.        Marland Kitchen LYRICA 75 MG capsule Take 75 mg by mouth 2 (two) times daily.       . metFORMIN (GLUMETZA) 500 MG (MOD) 24 hr tablet Take 500 mg by mouth daily with breakfast.        . metoprolol (LOPRESSOR) 100 MG tablet Take 100 mg by mouth 2 (two) times daily.       . Multiple Vitamin (MULTIVITAMIN) capsule Take 1 capsule by mouth daily.        Marland Kitchen omeprazole (PRILOSEC) 40 MG capsule Take  1 capsule (40 mg total) by mouth daily.  30 capsule  1  . oxyCODONE (ROXICODONE) 15 MG immediate release tablet Take 30 mg by mouth every 8 (eight) hours as needed.       . pantoprazole (PROTONIX) 40 MG tablet TAKE (1) TABLET BY MOUTH ONCE DAILY.  30 tablet  11  . QC PEN NEEDLES 31G X 6 MM MISC       . dicyclomine (BENTYL) 10 MG capsule TAKE 1-2 CAPSULES 30 MINTUES PRIOR TO MEALS AND AT BEDTIME.  120 capsule  5  . topiramate (TOPAMAX) 25 MG tablet Take 1 tablet (25 mg total) by mouth daily.  30 tablet  1  . baclofen (LIORESAL) 10 MG tablet Take 1 tablet (10 mg total) by mouth 3 (three) times daily.  60 each  0  . carisoprodol (SOMA) 350 MG  tablet Take 1 tablet (350 mg total) by mouth every 6 (six) hours.  56 tablet  0  . hyoscyamine (LEVSIN/SL) 0.125 MG SL tablet Place 1 tablet (0.125 mg total) under the tongue 4 (four) times daily -  before meals and at bedtime.  120 tablet  3  . methylPREDNISolone acetate (DEPO-MEDROL) 40 MG/ML injection Inject 1 mL (40 mg total) into the articular space once.  1 mL  0  . predniSONE (DELTASONE) 10 MG tablet Take 1 tablet (10 mg total) by mouth daily.  60 tablet  0  . Vitamin D, Ergocalciferol, (DRISDOL) 50000 UNITS CAPS Take 50,000 Units by mouth.         No facility-administered medications prior to visit.    PAST MEDICAL HISTORY: Past Medical History  Diagnosis Date  . Depression   . Diabetes mellitus   . LBP (low back pain)   . BMI (body mass index) 20.0-29.9 2008 192 lbs  . GERD (gastroesophageal reflux disease) 2002    202 lbs  . Irritable bowel syndrome 2008    diarrhea predominant MEDS TRIED: BENTYL, LEVBID  . RUQ abdominal pain 2004 HIDA NL CTA w/ IVC-FATTY LIVER, ANGIOMYOLIPOMA LEFT KIDNEY    2008 HIDA NL  . PTSD (post-traumatic stress disorder)     PAST SURGICAL HISTORY: Past Surgical History  Procedure Laterality Date  . Total abdominal hysterectomy  2000 fibroids/DUB  . Shoulder surgery  1997 RIGHT  . Breast reduction surgery  2006  . Lumbar disc surgery  2005  . Ankle surgery  RIGHT  . Colonoscopy  2008 RMR    PAN-COLONIC TICS, NL TI  . Upper gastrointestinal endoscopy  2008 RMR    SML HH o/w NL    FAMILY HISTORY: Family History  Problem Relation Age of Onset  . Breast cancer Maternal Aunt   . Anxiety disorder Maternal Aunt   . Colon polyps Neg Hx   . Colon cancer Neg Hx   . ADD / ADHD Neg Hx   . Bipolar disorder Neg Hx   . Depression Neg Hx   . OCD Neg Hx   . Paranoid behavior Neg Hx   . Schizophrenia Neg Hx   . Sexual abuse Neg Hx   . Physical abuse Neg Hx   . Anxiety disorder Mother   . Drug abuse Daughter   . Anxiety disorder Daughter   .  Seizures Sister   . Alcohol abuse Maternal Uncle   . Mental illness Cousin   . Dementia Maternal Aunt   . Anxiety disorder Sister   . Anxiety disorder Sister   . Drug abuse Sister     SOCIAL  HISTORY:  History   Social History  . Marital Status: Single    Spouse Name: N/A    Number of Children: 3  . Years of Education: 12th   Occupational History  .      [employer]   Social History Main Topics  . Smoking status: Former Smoker -- 0.50 packs/day for 2 years    Types: Cigarettes    Quit date: 09/12/2013  . Smokeless tobacco: Never Used     Comment: 10 cigs a day as of 03/24/2013  . Alcohol Use: No  . Drug Use: No  . Sexual Activity: Not on file   Other Topics Concern  . Not on file   Social History Narrative   Patient lives at home with her family.   Caffeine Use: 2 cups of coffee daily     PHYSICAL EXAM  Filed Vitals:   09/16/13 1332  BP: 124/85  Pulse: 71  Temp: 98.1 F (36.7 C)  TempSrc: Oral  Height: 5\' 11"  (1.803 m)  Weight: 212 lb (96.163 kg)    Not recorded    Body mass index is 29.58 kg/(m^2).  GENERAL EXAM: Patient is in no distress; HEAD ROTATED RIGHT 5 DEGREES, HEAD TILTED RIGHT 5 DEGREES. CANNOT FULLY ROTATE HEAD TO THE LEFT PAST 45 DEGREES, DECR LEFT HEAD TILT. SUBJECTIVEPAIN IN LEFT TRAPEZIUS AND LEFT POSTERIOR-LATERAL NECK.  CARDIOVASCULAR: Regular rate and rhythm, no murmurs, no carotid bruits  NEUROLOGIC: MENTAL STATUS: awake, alert, language fluent, comprehension intact, naming intact CRANIAL NERVE: no papilledema on fundoscopic exam, pupils equal and reactive to light, visual fields full to confrontation, extraocular muscles intact, no nystagmus, facial sensation and strength symmetric, uvula midline, shoulder shrug symmetric, tongue midline. MOTOR: normal bulk and tone, full strength in the BUE, BLE SENSORY: normal and symmetric to light touch, temperature, vibration COORDINATION: finger-nose-finger, fine finger movements  normal REFLEXES: deep tendon reflexes present and symmetric GAIT/STATION: narrow based gait; able to walk on toes, heels and tandem; romberg is negative   DIAGNOSTIC DATA (LABS, IMAGING, TESTING) - I reviewed patient records, labs, notes, testing and imaging myself where available.  Lab Results  Component Value Date   WBC 6.8 12/27/2009   HGB 14.1 12/27/2009   HCT 41.6 12/27/2009   MCV 95.0 12/27/2009   PLT 303 12/27/2009      Component Value Date/Time   NA 135 12/27/2009 1207   K 4.1 12/27/2009 1207   CL 101 12/27/2009 1207   CO2 27 12/27/2009 1207   GLUCOSE 122* 12/27/2009 1207   BUN 7 12/27/2009 1207   CREATININE 0.74 12/27/2009 1207   CALCIUM 9.3 12/27/2009 1207   GFRNONAA >60 12/27/2009 1207   GFRAA  Value: >60        The eGFR has been calculated using the MDRD equation. This calculation has not been validated in all clinical situations. eGFR's persistently <60 mL/min signify possible Chronic Kidney Disease. 12/27/2009 1207   No results found for this basename: CHOL, HDL, LDLCALC, LDLDIRECT, TRIG, CHOLHDL   No results found for this basename: HGBA1C   No results found for this basename: VITAMINB12   No results found for this basename: TSH    I reviewed images myself and agree with interpretation.   08/01/13 MRI cervical spine - Other than a mild bulging annulus at C5-6 no significant findings in the cervical spine.   ASSESSMENT AND PLAN  47 y.o. year old female here with chronic pain, migraine headache, with evidence for mild torticollis with associated left neck pain.  I discussed options for treatment with the patient. I will optimize her migraine medication regimen. I will also refer her to my colleague for chemodenervation treatment.  PLAN: - start TPX 50mg  (qhs --> BID) + rizatriptan prn for migraine - refer to Dr. Terrace Arabia for torticollis treatment with chemodenervation (xeomin vs botox)  Return for to Dr. Terrace Arabia for xeomin injection.Marland Kitchen    Suanne Marker, MD 09/16/2013, 2:29  PM Certified in Neurology, Neurophysiology and Neuroimaging  [facility] [address], [address] [place], [place] [address] [phone]

## 2013-09-16 NOTE — Patient Instructions (Signed)
Start topiramate 50mg  at bedtime x 2 weeks; then increase to twice a day.  Start maxalt as needed for breakthrough migraine.  I will refer you to Dr. Terrace Arabia for xeomin or botox injections for torticollis.

## 2013-09-30 ENCOUNTER — Ambulatory Visit (INDEPENDENT_AMBULATORY_CARE_PROVIDER_SITE_OTHER): Payer: Medicare Other | Admitting: Psychology

## 2013-09-30 ENCOUNTER — Encounter (HOSPITAL_COMMUNITY): Payer: Self-pay | Admitting: Psychology

## 2013-09-30 DIAGNOSIS — G894 Chronic pain syndrome: Secondary | ICD-10-CM

## 2013-09-30 DIAGNOSIS — F329 Major depressive disorder, single episode, unspecified: Secondary | ICD-10-CM

## 2013-09-30 NOTE — Progress Notes (Signed)
Patient:  [name]   DOB: [birth year]  MR Number: [id number]  Location: BEHAVIORAL [facility] [address] [place] [address] Dept: [phone]  Start: 4 PM End: 5 PM  Provider/Observer:     Hershal Coria PSYD  Chief Complaint:      Chief Complaint  Patient presents with  . Depression  . Stress    Reason For Service:     The patient was referred because of increasing symptoms of depression. Relationship issues, financial problems, medical issues, and loss of her brother in 2002 were all problematic situations for her to deal with. Most recently, major conflicts between her, her knees, and her daughter have also been quite disruptive. At this point, the patient has continued to struggle with severe back issues along with severe depression.  Interventions Strategy:  Cognitive/behavioral psychotherapy  Participation Level:   Active  Participation Quality:  Appropriate      Behavioral Observation:  Well Groomed, Alert, and Appropriate.   Current Psychosocial Factors: Work is going fine and good and really help with self estimeem.  Patient's daughter has gotten arrested and the grandchild has been taken away.  Patient may end up with another child to take care of, which she would if she has to.  Pt's mother not doing well in nursing home.   Content of Session:   Reviewed current symptoms and continued work on therapeutic interventions  Current Status:   Contineud pain in neck, but depression better..  Patient Progress:   Stable  Target Goals:   Target goals include reducing symptoms of depression, anxiety, and increase coping skills.  Last Reviewed:   09/30/2013  Goals Addressed Today:    Today we worked on adjusted issues with the dissolution of her relationship with "PJ"  Impression/Diagnosis:   The patient has had significant psychosocial stressors throughout her life going on going  back to childhood. Most recently she has been taking care of her g as well as her sister's children. Her sister died several years ago and she has raised her sister's children. She also adopted one of her sister's children her. The patient has her own daughter who has that was serious and significant substance abuse. The patient has recurrent major depressive events.randmother who had a severe stroke  Diagnosis:    Axis I:  Major depressive disorder  Pain syndrome, chronic      Axis II: No diagnosis

## 2013-10-02 ENCOUNTER — Encounter (HOSPITAL_COMMUNITY): Payer: Self-pay | Admitting: Psychology

## 2013-10-02 NOTE — Progress Notes (Signed)
Patient:  [name]   DOB: 1966/08/09  MR Number: [id number]  Location: BEHAVIORAL [facility] [address] [place] [address] Dept: [phone]  Start: 4 PM End: 5 PM  Provider/Observer:     Hershal Coria PSYD  Chief Complaint:      Chief Complaint  Patient presents with  . Depression  . Headache    Reason For Service:     The patient was referred because of increasing symptoms of depression. Relationship issues, financial problems, medical issues, and loss of her brother in 2002 were all problematic situations for her to deal with. Most recently, major conflicts between her, her knees, and her daughter have also been quite disruptive. At this point, the patient has continued to struggle with severe back issues along with severe depression.  Interventions Strategy:  Cognitive/behavioral psychotherapy  Participation Level:   Active  Participation Quality:  Appropriate      Behavioral Observation:  Well Groomed, Alert, and Appropriate.   Current Psychosocial Factors: The patient reports that there've been a lot of happening within her family recently. She reports that her mother has decided she wanted to go to a nursing home and as been there for a couple of weeks. Her mother is doing well. She reports that the mother's desire to go was at least in part due to the patient having physical difficulties with her shoulder and back having difficulty picking up the patient. The patient reports that her sister and oldest adopted daughter continue to no longer be in the house which is than a major improvement in her living situation. The patient also reports that her ex-boyfriend and her sister were picked up in a police drug operation for sella distribution of various narcotics. She reports that this was of no surprise to her and these drug interactions were done while she moved into her new house in either  one of them were around her house at all [place] they were involved in activities that she no willingness to even have them around her when they're engaged in. The patient reports that her sister is still in jail and no one has been willing to bail her out as of yet.  The patient reports that her daughter is on scheduled to graduate early and has decided she wants to go into the [employer]. She has new boyfriend that is encouraging her to go into the [employer] with him but the patient is worried that this is not the right place for her and that she should go into the Engineer, petroleum instead. The patient is concerned about her daughter's boyfriend been fairly controlling of her.  Content of Session:   Reviewed current symptoms and continued work on therapeutic interventions  Current Status:   The patient reports continued mood disturbance with episodes of depression but overall she is doing much better. Distress level in her home has improved dramatically over the past several months. The patient is, however, continuing Orthopedic issues with her shoulder as well as being evaluated for possible surgical interventions on her neck.  Patient Progress:   Stable  Target Goals:   Target goals include reducing symptoms of depression, anxiety, and increase coping skills.  Last Reviewed:   08/03/2013  Goals Addressed Today:    Today we worked on adjusted issues with the dissolution of her relationship with "PJ"  Impression/Diagnosis:   The patient has had significant psychosocial stressors throughout her life going on  going back to childhood. Most recently she has been taking care of her g as well as her sister's children. Her sister died several years ago and she has raised her sister's children. She also adopted one of her sister's children her. The patient has her own daughter who has that was serious and significant substance abuse. The patient has recurrent major depressive events.randmother who had a severe  stroke  Diagnosis:    Axis I:  Major depressive disorder  Pain syndrome, chronic      Axis II: No diagnosis

## 2013-10-13 ENCOUNTER — Encounter (HOSPITAL_COMMUNITY): Payer: Self-pay | Admitting: Psychology

## 2013-10-13 NOTE — Progress Notes (Signed)
Patient:  [name]   DOB: October 01, 1966  MR Number: [id number]  Location: BEHAVIORAL [facility] [address] [place] [address] Dept: [phone]  Start: 4 PM End: 5 PM  Provider/Observer:     Hershal Coria PSYD  Chief Complaint:      Chief Complaint  Patient presents with  . Anxiety  . Depression  . Stress  . Agitation    Reason For Service:     The patient was referred because of increasing symptoms of depression. Relationship issues, financial problems, medical issues, and loss of her brother in 2002 were all problematic situations for her to deal with. Most recently, major conflicts between her, her knees, and her daughter have also been quite disruptive. At this point, the patient has continued to struggle with severe back issues along with severe depression.  Interventions Strategy:  Cognitive/behavioral psychotherapy  Participation Level:   Active  Participation Quality:  Appropriate      Behavioral Observation:  Well Groomed, Alert, and Appropriate.   Current Psychosocial Factors: The patient reports that she is still trying to manage with taking care of various family members. She reports that she is concerned about what is going on with her daughter was moved to another town.  Content of Session:   Reviewed current symptoms and continued work on therapeutic interventions  Current Status:   The patient reports continued mood disturbance with episodes of depression but overall she is doing much better. Distress level in her home has improved dramatically over the past several months. The patient is, however, continuing Orthopedic issues with her shoulder as well as being evaluated for possible surgical interventions on her neck.  Patient Progress:   Stable  Target Goals:   Target goals include reducing symptoms of depression, anxiety, and increase coping skills.  Last  Reviewed:   09/02/2013  Goals Addressed Today:    Today we worked on adjusted issues with the dissolution of her relationship with "PJ"  Impression/Diagnosis:   The patient has had significant psychosocial stressors throughout her life going on going back to childhood. Most recently she has been taking care of her g as well as her sister's children. Her sister died several years ago and she has raised her sister's children. She also adopted one of her sister's children her. The patient has her own daughter who has that was serious and significant substance abuse. The patient has recurrent major depressive events.randmother who had a severe stroke  Diagnosis:    Axis I:  Major depressive disorder  Pain syndrome, chronic      Axis II: No diagnosis

## 2013-11-02 ENCOUNTER — Ambulatory Visit (INDEPENDENT_AMBULATORY_CARE_PROVIDER_SITE_OTHER): Payer: Medicare Other | Admitting: Psychology

## 2013-11-02 DIAGNOSIS — F329 Major depressive disorder, single episode, unspecified: Secondary | ICD-10-CM

## 2013-11-02 DIAGNOSIS — G894 Chronic pain syndrome: Secondary | ICD-10-CM

## 2013-11-06 ENCOUNTER — Other Ambulatory Visit: Payer: Self-pay | Admitting: Gastroenterology

## 2013-11-09 ENCOUNTER — Other Ambulatory Visit: Payer: Self-pay | Admitting: Gastroenterology

## 2013-11-16 ENCOUNTER — Other Ambulatory Visit (HOSPITAL_COMMUNITY): Payer: Self-pay | Admitting: Psychiatry

## 2013-11-16 MED ORDER — DULOXETINE HCL 20 MG PO CPEP: 60.0000 mg | ORAL_CAPSULE | Freq: Two times a day (BID) | ORAL | Status: DC

## 2013-11-17 ENCOUNTER — Ambulatory Visit (HOSPITAL_COMMUNITY): Payer: Self-pay | Admitting: Psychiatry

## 2013-11-17 ENCOUNTER — Telehealth (HOSPITAL_COMMUNITY): Payer: Self-pay

## 2013-11-23 ENCOUNTER — Ambulatory Visit (INDEPENDENT_AMBULATORY_CARE_PROVIDER_SITE_OTHER): Payer: Medicare Other | Admitting: Psychiatry

## 2013-11-23 ENCOUNTER — Encounter (HOSPITAL_COMMUNITY): Payer: Self-pay | Admitting: Psychiatry

## 2013-11-23 VITALS — BP 130/88 | Ht 71.0 in | Wt 217.0 lb

## 2013-11-23 DIAGNOSIS — F329 Major depressive disorder, single episode, unspecified: Secondary | ICD-10-CM

## 2013-11-23 DIAGNOSIS — F29 Unspecified psychosis not due to a substance or known physiological condition: Secondary | ICD-10-CM

## 2013-11-23 MED ORDER — DULOXETINE HCL 60 MG PO CPEP: 60.0000 mg | ORAL_CAPSULE | Freq: Every day | ORAL | Status: DC
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
## 2013-11-23 NOTE — Telephone Encounter (Signed)
Coming in today

## 2013-11-27 ENCOUNTER — Encounter (HOSPITAL_COMMUNITY): Payer: Self-pay | Admitting: Psychology

## 2013-11-27 NOTE — Progress Notes (Signed)
Patient:  [name]   DOB: September 02, 1966  MR Number: [id number]  Location: [facility]AVIORAL [facility] [address] [place] [address] Dept: [phone]  Start: 4 PM End: 5 PM  Provider/Observer:     Hershal CoriaJohn R Rodenbough PSYD  Chief Complaint:      Chief Complaint  Patient presents with  . Depression  . Anxiety  . Stress    Reason For Service:     The patient was referred because of increasing symptoms of depression. Relationship issues, financial problems, medical issues, and loss of her brother in 2002 were all problematic situations for her to deal with. Most recently, major conflicts between her, her knees, and her daughter have also been quite disruptive. At this point, the patient has continued to struggle with severe back issues along with severe depression.  Interventions Strategy:  Cognitive/behavioral psychotherapy  Participation Level:   Active  Participation Quality:  Appropriate      Behavioral Observation:  Well Groomed, Alert, and Appropriate.   Current Psychosocial Factors: The patient reports that her work is doing well and that she is able to do some although it is stressful her back and she has to limit she does physically. The patient reports that she is still having some stressors within the family related to her sister but that she is coping with this.   Content of Session:   Reviewed current symptoms and continued work on therapeutic interventions  Current Status:   Contineud pain in neck, but depression better..  Patient Progress:   Stable  Target Goals:   Target goals include reducing symptoms of depression, anxiety, and increase coping skills.  Last Reviewed:   11/02/2013  Goals Addressed Today:    Today we worked on adjusted issues with the dissolution of her relationship with "PJ"  Impression/Diagnosis:   The patient has had significant psychosocial stressors throughout  her life going on going back to childhood. Most recently she has been taking care of her g as well as her sister's children. Her sister died several years ago and she has raised her sister's children. She also adopted one of her sister's children her. The patient has her own daughter who has that was serious and significant substance abuse. The patient has recurrent major depressive events.randmother who had a severe stroke  Diagnosis:    Axis I:  Major depressive disorder  Pain syndrome, chronic      Axis II: No diagnosis

## 2013-12-08 ENCOUNTER — Other Ambulatory Visit: Payer: Self-pay | Admitting: Gastroenterology

## 2013-12-16 ENCOUNTER — Ambulatory Visit (INDEPENDENT_AMBULATORY_CARE_PROVIDER_SITE_OTHER): Payer: Medicare Other | Admitting: Psychology

## 2013-12-16 DIAGNOSIS — F329 Major depressive disorder, single episode, unspecified: Secondary | ICD-10-CM

## 2013-12-16 DIAGNOSIS — G894 Chronic pain syndrome: Secondary | ICD-10-CM

## 2013-12-17 ENCOUNTER — Encounter (HOSPITAL_COMMUNITY): Payer: Self-pay | Admitting: Psychology

## 2013-12-17 NOTE — Progress Notes (Signed)
Patient:  [name]   DOB: [birth year]  MR Number: [id number]  [facility] [address] [place] [address] Dept: [phone]  Start: 4 PM End: 5 PM  Provider/Observer:     Hershal CoriaJohn R Oliana Gowens PSYD  Chief Complaint:      Chief Complaint  Patient presents with  . Depression  . Stress    Reason For Service:     The patient was referred because of increasing symptoms of depression. Relationship issues, financial problems, medical issues, and loss of her brother in 2002 were all problematic situations for her to deal with. Most recently, major conflicts between her, her knees, and her daughter have also been quite disruptive. At this point, the patient has continued to struggle with severe back issues along with severe depression.  Interventions Strategy:  Cognitive/behavioral psychotherapy  Participation Level:   Active  Participation Quality:  Appropriate      Behavioral Observation:  Well Groomed, Alert, and Appropriate.   Current Psychosocial Factors: The patient reports that she is likely now going to be taking over care/custody of her daughter's second child as her daughter has not been remaining drug free and is in failing drug test. The patient has been very hesitant to take on his responsibility but after several weeks of caring for the child during this temporary. For her daughter the patient has grown quite close and attached to the daughter.    Content of Session:   Reviewed current symptoms and continued work on therapeutic interventions  Current Status:   Contineud pain in neck, but depression better..  Patient Progress:   Stable  Target Goals:   Target goals include reducing symptoms of depression, anxiety, and increase coping skills.  Last Reviewed:   129/2015  Goals Addressed Today:    Today we worked on adjusted issues with the dissolution of her relationship  with "PJ"  Impression/Diagnosis:   The patient has had significant psychosocial stressors throughout her life going on going back to childhood. Most recently she has been taking care of her g as well as her sister's children. Her sister died several years ago and she has raised her sister's children. She also adopted one of her sister's children her. The patient has her own daughter who has that was serious and significant substance abuse. The patient has recurrent major depressive events.randmother who had a severe stroke  Diagnosis:    Axis I:  Major depressive disorder  Pain syndrome, chronic      Axis II: No diagnosis

## 2014-01-05 ENCOUNTER — Ambulatory Visit (HOSPITAL_COMMUNITY): Payer: Self-pay | Admitting: Psychiatry

## 2014-01-14 ENCOUNTER — Ambulatory Visit (HOSPITAL_COMMUNITY): Payer: Self-pay | Admitting: Psychology

## 2014-01-19 ENCOUNTER — Ambulatory Visit (INDEPENDENT_AMBULATORY_CARE_PROVIDER_SITE_OTHER): Payer: Medicare Other | Admitting: Orthopedic Surgery

## 2014-01-19 VITALS — BP 125/82 | Ht 71.0 in | Wt 209.0 lb

## 2014-01-19 DIAGNOSIS — M25562 Pain in left knee: Secondary | ICD-10-CM

## 2014-01-19 DIAGNOSIS — M25569 Pain in unspecified knee: Secondary | ICD-10-CM

## 2014-01-19 NOTE — Patient Instructions (Addendum)
Injection  Joint Injection Care After Refer to this sheet in the next few days. These instructions provide you with information on caring for yourself after you have had a joint injection. Your caregiver also may give you more specific instructions. Your treatment has been planned according to current medical practices, but problems sometimes occur. Call your caregiver if you have any problems or questions after your procedure. After any type of joint injection, it is not uncommon to experience:  Soreness, swelling, or bruising around the injection site.  Mild numbness, tingling, or weakness around the injection site caused by the numbing medicine used before or with the injection. It also is possible to experience the following effects associated with the specific agent after injection:  Iodine-based contrast agents:  Allergic reaction (itching, hives, widespread redness, and swelling beyond the injection site).  Corticosteroids (These effects are rare.):  Allergic reaction.  Increased blood sugar levels (If you have diabetes and you notice that your blood sugar levels have increased, notify your caregiver).  Increased blood pressure levels.  Mood swings.  Hyaluronic acid in the use of viscosupplementation.  Temporary heat or redness.  Temporary rash and itching.  Increased fluid accumulation in the injected joint. These effects all should resolve within a day after your procedure.  HOME CARE INSTRUCTIONS  Limit yourself to light activity the day of your procedure. Avoid lifting heavy objects, bending, stooping, or twisting.  Take prescription or over-the-counter pain medication as directed by your caregiver.  You may apply ice to your injection site to reduce pain and swelling the day of your procedure. Ice may be applied 03-04 times:  Put ice in a plastic bag.  Place a towel between your skin and the bag.  Leave the ice on for no longer than 15-20 minutes each  time. SEEK IMMEDIATE MEDICAL CARE IF:   Pain and swelling get worse rather than better or extend beyond the injection site.  Numbness does not go away.  Blood or fluid continues to leak from the injection site.  You have chest pain.  You have swelling of your face or tongue.  You have trouble breathing or you become dizzy.  You develop a fever, chills, or severe tenderness at the injection site that last longer than 1 day. MAKE SURE YOU:  Understand these instructions.  Watch your condition.  Get help right away if you are not doing well or if you get worse. Document Released: 07/19/2011 Document Revised: 01/28/2012 Document Reviewed: 07/19/2011 Exit[employer] Patient Information 2014 [employer].  

## 2014-01-19 NOTE — Progress Notes (Signed)
Patient ID: Cynthia LarkFelice A Meggett, female   DOB: Jan 21, 1966, 48 y.o.   MRN: [id number]  Chief Complaint  Patient presents with  . Follow-up    Recheck left knee.     BP 125/82  Ht 5\' 11"  (1.803 m)  Wt 209 lb (94.802 kg)  BMI 29.16 kg/m2  Presents with chronic throbbing pain left knee. Currently undergoing chronic pain management including epidural steroid injections for ongoing difficulties with her lower back she's had radicular symptoms in the left lower extremity for several years has not been able to obtain relief. She was in the recheck again  Review of systems leg pain with radicular symptoms pain in the back of the knee posterior thigh radiating into the foot with throbbing pain in the left knee with no history of recent trauma  Past Medical History  Diagnosis Date  . Depression   . Diabetes mellitus   . LBP (low back pain)   . BMI (body mass index) 20.0-29.9 2008 192 lbs  . GERD (gastroesophageal reflux disease) 2002    202 lbs  . Irritable bowel syndrome 2008    diarrhea predominant MEDS TRIED: BENTYL, LEVBID  . RUQ abdominal pain 2004 HIDA NL CTA w/ IVC-FATTY LIVER, ANGIOMYOLIPOMA LEFT KIDNEY    2008 HIDA NL  . PTSD (post-traumatic stress disorder)    Past Surgical History  Procedure Laterality Date  . Total abdominal hysterectomy  2000 fibroids/DUB  . Shoulder surgery  1997 RIGHT  . Breast reduction surgery  2006  . Lumbar disc surgery  2005  . Ankle surgery  RIGHT  . Colonoscopy  2008 RMR    PAN-COLONIC TICS, NL TI  . Upper gastrointestinal endoscopy  2008 RMR    SML HH o/w NL    Clinical examination shows well-developed well-nourished female grooming and hygiene are intact she is oriented x3 she is frustrated about her leg pain her mood is otherwise normal she walks with assistive device and there is no detectable limp  Inspection reveals no specific swelling full range of motion passively. Ligaments are stable. Motor exam is normal. Scans intact pulses normal  sensation no lymphadenopathy  I think her back is her major problem. I did give her a trial injection to the left knee to see if we give her any relief. She's returned back to her physician for ongoing management of her chronic pain  Inject left knee joint Knee  Injection Procedure Note  Pre-operative Diagnosis: Left knee pain Post-operative Diagnosis: same  Indications: Pain left knee Anesthesia: Ethyl chloride  Procedure Details   Verbal consent was obtained for the procedure. The timeout was taken to confirm the right knee as a surgical site  The skin was prepped with alcohol and ethyl chloride was sprayed on the knee  Medications used include Depo-Medrol 40 mg and lidocaine 1% 3 cc  After anesthesia the knee was injected from a lateral approach was well tolerated without complications

## 2014-01-25 ENCOUNTER — Ambulatory Visit (INDEPENDENT_AMBULATORY_CARE_PROVIDER_SITE_OTHER): Payer: Medicare Other | Admitting: Psychiatry

## 2014-01-25 ENCOUNTER — Encounter (HOSPITAL_COMMUNITY): Payer: Self-pay | Admitting: Psychiatry

## 2014-01-25 VITALS — BP 110/80 | Ht 71.0 in | Wt 207.0 lb

## 2014-01-25 DIAGNOSIS — F329 Major depressive disorder, single episode, unspecified: Secondary | ICD-10-CM

## 2014-01-25 DIAGNOSIS — F29 Unspecified psychosis not due to a substance or known physiological condition: Secondary | ICD-10-CM

## 2014-01-25 MED ORDER — BUPROPION HCL ER (XL) 150 MG PO TB24: 150.0000 mg | ORAL_TABLET | ORAL | Status: DC

## 2014-01-25 MED ORDER — DULOXETINE HCL 60 MG PO CPEP: 60.0000 mg | ORAL_CAPSULE | Freq: Every day | ORAL | Status: DC
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
## 2014-02-18 ENCOUNTER — Ambulatory Visit (HOSPITAL_COMMUNITY): Payer: Self-pay | Admitting: Psychology

## 2014-02-25 ENCOUNTER — Ambulatory Visit (HOSPITAL_COMMUNITY): Payer: Self-pay | Admitting: Psychiatry

## 2014-03-10 ENCOUNTER — Other Ambulatory Visit (HOSPITAL_COMMUNITY): Payer: Self-pay | Admitting: Psychiatry

## 2014-04-07 ENCOUNTER — Other Ambulatory Visit (HOSPITAL_COMMUNITY): Payer: Self-pay | Admitting: Psychiatry

## 2014-04-08 ENCOUNTER — Other Ambulatory Visit (HOSPITAL_COMMUNITY): Payer: Self-pay | Admitting: Psychiatry

## 2014-04-08 MED ORDER — BUPROPION HCL ER (XL) 150 MG PO TB24: 150.0000 mg | ORAL_TABLET | ORAL | Status: DC

## 2014-04-30 ENCOUNTER — Other Ambulatory Visit: Payer: Self-pay | Admitting: Gastroenterology

## 2014-05-11 ENCOUNTER — Other Ambulatory Visit (HOSPITAL_COMMUNITY): Payer: Self-pay | Admitting: Psychiatry

## 2014-05-11 ENCOUNTER — Telehealth (HOSPITAL_COMMUNITY): Payer: Self-pay | Admitting: *Deleted

## 2014-05-11 MED ORDER — DULOXETINE HCL 60 MG PO CPEP: 60.0000 mg | ORAL_CAPSULE | Freq: Every day | ORAL | Status: DC

## 2014-05-11 NOTE — Telephone Encounter (Signed)
done

## 2014-06-08 ENCOUNTER — Other Ambulatory Visit (HOSPITAL_COMMUNITY): Payer: Self-pay | Admitting: Psychiatry

## 2014-06-10 ENCOUNTER — Ambulatory Visit (INDEPENDENT_AMBULATORY_CARE_PROVIDER_SITE_OTHER): Payer: Medicare Other | Admitting: Psychology

## 2014-06-10 DIAGNOSIS — F332 Major depressive disorder, recurrent severe without psychotic features: Secondary | ICD-10-CM

## 2014-06-10 DIAGNOSIS — G894 Chronic pain syndrome: Secondary | ICD-10-CM

## 2014-06-16 ENCOUNTER — Encounter (HOSPITAL_COMMUNITY): Payer: Self-pay | Admitting: Psychology
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
## 2014-06-29 ENCOUNTER — Ambulatory Visit (INDEPENDENT_AMBULATORY_CARE_PROVIDER_SITE_OTHER): Payer: Medicare Other | Admitting: Orthopedic Surgery

## 2014-06-29 ENCOUNTER — Ambulatory Visit (INDEPENDENT_AMBULATORY_CARE_PROVIDER_SITE_OTHER): Payer: Medicare Other

## 2014-06-29 VITALS — Ht 71.0 in | Wt 207.0 lb

## 2014-06-29 DIAGNOSIS — M25562 Pain in left knee: Secondary | ICD-10-CM

## 2014-06-29 DIAGNOSIS — M25559 Pain in unspecified hip: Secondary | ICD-10-CM

## 2014-06-29 DIAGNOSIS — M25552 Pain in left hip: Secondary | ICD-10-CM

## 2014-06-29 DIAGNOSIS — M25569 Pain in unspecified knee: Secondary | ICD-10-CM

## 2014-06-29 NOTE — Patient Instructions (Signed)
Take the oxycodone 3-4 times a day

## 2014-06-29 NOTE — Progress Notes (Signed)
Chief Complaint  Patient presents with  . Knee Injury    left knee injury, DOI 06/02/14, FELL    New problem  This is a 48 year old female has had surgery on her left knee for patellofemoral problems fell landed on her left knee complains of left knee pain left thigh pain left hip pain left groin pain.  Review of systems negative  Past Medical History  Diagnosis Date  . Depression   . Diabetes mellitus   . LBP (low back pain)   . BMI (body mass index) 20.0-29.9 2008 192 lbs  . GERD (gastroesophageal reflux disease) 2002    202 lbs  . Irritable bowel syndrome 2008    diarrhea predominant MEDS TRIED: BENTYL, LEVBID  . RUQ abdominal pain 2004 HIDA NL CTA w/ IVC-FATTY LIVER, ANGIOMYOLIPOMA LEFT KIDNEY    2008 HIDA NL  . PTSD (post-traumatic stress disorder)    Past Surgical History  Procedure Laterality Date  . Total abdominal hysterectomy  2000 fibroids/DUB  . Shoulder surgery  1997 RIGHT  . Breast reduction surgery  2006  . Lumbar disc surgery  2005  . Ankle surgery  RIGHT  . Colonoscopy  2008 RMR    PAN-COLONIC TICS, NL TI  . Upper gastrointestinal endoscopy  2008 RMR    SML HH o/w NL   Review of systems chronic back pain chronic pain syndrome  Well-developed well-nourished female grooming and hygiene intact I watched her walk she has no significant limp. She has some valgus at the knee and is slightly excessively positive for progression angle. Mood and affect are normal. She has mild tenderness over the left knee none over the right she has full range of motion of the right knee may be some limitations in motion the left knee both knees are stable strength is normal in both hips and knees skin normal both legs pulses intact bilaterally and sensation is intact bilaterally  Groin pain is noted with external rotation no pain in either hip with log roll maneuver hip flexion is normal  X-rayed her knee and her hip found no abnormalities  Diagnosis jammed hip from axial  loading of the knee  Recommend that she take her oxycodone 30 mg more frequently she's only been taking one to 2 times a day acetate 3-4 times a day. There is no fracture so there is no need for to come back.

## 2014-07-09 ENCOUNTER — Other Ambulatory Visit: Payer: Self-pay | Admitting: Gastroenterology

## 2014-07-09 ENCOUNTER — Other Ambulatory Visit (HOSPITAL_COMMUNITY): Payer: Self-pay | Admitting: Psychiatry

## 2014-07-12 ENCOUNTER — Telehealth (HOSPITAL_COMMUNITY): Payer: Self-pay | Admitting: *Deleted

## 2014-07-12 NOTE — Telephone Encounter (Signed)
pt pharmacy requesting pt Burpropion. Called pt to make f/u for meds to be refilled. pt was last seen 01-25-14 and no showed her 02-25-14 appt. Pt Rx was refilled 04-08-14 with 30 tablets 2 refills. Pt is aware she have to show up to her up coming appt.

## 2014-07-13 ENCOUNTER — Other Ambulatory Visit (HOSPITAL_COMMUNITY): Payer: Self-pay | Admitting: *Deleted

## 2014-07-13 MED ORDER — BUPROPION HCL ER (XL) 150 MG PO TB24: 150.0000 mg | ORAL_TABLET | ORAL | Status: DC

## 2014-07-13 NOTE — Telephone Encounter (Signed)
Per Pharmacist at [facility], they will go ahead and get pt Bupropion 150 mg ready for her.

## 2014-07-13 NOTE — Telephone Encounter (Signed)
Called pharmacy and spoke with the Pharmacist to fill medication

## 2014-07-13 NOTE — Telephone Encounter (Signed)
Pen in error 

## 2014-07-13 NOTE — Telephone Encounter (Signed)
Refill for 30 days  

## 2014-07-19 ENCOUNTER — Encounter (HOSPITAL_COMMUNITY): Payer: Self-pay | Admitting: Psychiatry

## 2014-07-19 ENCOUNTER — Ambulatory Visit (HOSPITAL_COMMUNITY): Payer: Self-pay | Admitting: Psychiatry

## 2014-07-27 ENCOUNTER — Ambulatory Visit (INDEPENDENT_AMBULATORY_CARE_PROVIDER_SITE_OTHER): Payer: Medicare Other | Admitting: Psychiatry

## 2014-07-27 ENCOUNTER — Encounter (HOSPITAL_COMMUNITY): Payer: Self-pay | Admitting: Psychiatry

## 2014-07-27 VITALS — BP 134/89 | HR 90 | Ht 71.0 in | Wt 201.8 lb

## 2014-07-27 DIAGNOSIS — F323 Major depressive disorder, single episode, severe with psychotic features: Secondary | ICD-10-CM

## 2014-07-27 DIAGNOSIS — F332 Major depressive disorder, recurrent severe without psychotic features: Secondary | ICD-10-CM

## 2014-07-27 MED ORDER — DULOXETINE HCL 60 MG PO CPEP: 60.0000 mg | ORAL_CAPSULE | Freq: Every day | ORAL | Status: DC

## 2014-07-27 MED ORDER — ZOLPIDEM TARTRATE 10 MG PO TABS: 10.0000 mg | ORAL_TABLET | Freq: Every evening | ORAL | Status: DC | PRN

## 2014-07-27 MED ORDER — BUPROPION HCL ER (XL) 150 MG PO TB24
150.0000 mg | ORAL_TABLET | ORAL | Status: DC
Start: 2014-07-27 — End: 2014-08-25
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
## 2014-08-10 ENCOUNTER — Encounter (HOSPITAL_COMMUNITY): Payer: Self-pay | Admitting: Psychology

## 2014-08-10 ENCOUNTER — Ambulatory Visit (INDEPENDENT_AMBULATORY_CARE_PROVIDER_SITE_OTHER): Payer: Medicare Other | Admitting: Psychology

## 2014-08-10 DIAGNOSIS — F332 Major depressive disorder, recurrent severe without psychotic features: Secondary | ICD-10-CM

## 2014-08-10 NOTE — Progress Notes (Signed)
Patient:  [name]   DOB: [birth year]  MR Number: [id number]  Location: BEHAVIORAL [facility] [address] [place] [address] Dept: [phone]  Start: 11 AM End: 12 PM  Provider/Observer:     Hershal Coria PSYD  Chief Complaint:      Chief Complaint  Patient presents with  . Depression  . Stress    Reason For Service:     The patient was referred because of increasing symptoms of depression. Relationship issues, financial problems, medical issues, and loss of her brother in 2002 were all problematic situations for her to deal with. Most recently, major conflicts between her, her knees, and her daughter have also been quite disruptive. At this point, the patient has continued to struggle with severe back issues along with severe depression.  Interventions Strategy:  Cognitive/behavioral psychotherapy  Participation Level:   Active  Participation Quality:  Appropriate      Behavioral Observation:  Well Groomed, Alert, and Appropriate.   Current Psychosocial Factors: The patient reports that she is considering moving so she can be even further awayfrom some family members that continue to cause issues.  She reports that she is looking at several options.    Content of Session:   Reviewed current symptoms and continued work on therapeutic interventions  Current Status:   Continues with orthopedic issues, and reports that she has had some increase in depression but has gotten better since she saw Dr. Tenny Craw.  She has not used the Ambien yet as her sleep has improveds some since prescribed.  Patient Progress:   Stable  Target Goals:   Target goals include reducing symptoms of depression, anxiety, and increase coping skills.  Last Reviewed:   08/10/2014  Goals Addressed Today:    Today we worked on adjusted issues with the dissolution of her relationship with "PJ"  Impression/Diagnosis:   The  patient has had significant psychosocial stressors throughout her life going on going back to childhood. Most recently she has been taking care of her g as well as her sister's children. Her sister died several years ago and she has raised her sister's children. She also adopted one of her sister's children her. The patient has her own daughter who has that was serious and significant substance abuse. The patient has recurrent major depressive events.randmother who had a severe stroke  Diagnosis:    Axis I:  Major depressive disorder, recurrent, severe without psychotic features      Axis II: No diagnosis

## 2014-08-25 ENCOUNTER — Encounter (HOSPITAL_COMMUNITY): Payer: Self-pay | Admitting: Psychiatry

## 2014-08-25 ENCOUNTER — Ambulatory Visit (INDEPENDENT_AMBULATORY_CARE_PROVIDER_SITE_OTHER): Payer: Medicare Other | Admitting: Psychiatry

## 2014-08-25 VITALS — BP 123/94 | HR 72 | Ht 71.0 in | Wt 198.8 lb

## 2014-08-25 DIAGNOSIS — F333 Major depressive disorder, recurrent, severe with psychotic symptoms: Secondary | ICD-10-CM

## 2014-08-25 DIAGNOSIS — F332 Major depressive disorder, recurrent severe without psychotic features: Secondary | ICD-10-CM

## 2014-08-25 MED ORDER — DULOXETINE HCL 60 MG PO CPEP: 60.0000 mg | ORAL_CAPSULE | Freq: Every day | ORAL | Status: DC

## 2014-08-25 MED ORDER — BUPROPION HCL ER (XL) 150 MG PO TB24: 150.0000 mg | ORAL_TABLET | ORAL | Status: DC

## 2014-08-25 MED ORDER — SUVOREXANT 15 MG PO TABS: 15.0000 mg | ORAL_TABLET | Freq: Every day | ORAL | Status: DC
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
## 2014-09-06 ENCOUNTER — Other Ambulatory Visit: Payer: Medicare Other | Admitting: Obstetrics and Gynecology

## 2014-09-07 ENCOUNTER — Telehealth (HOSPITAL_COMMUNITY): Payer: Self-pay | Admitting: *Deleted

## 2014-09-07 NOTE — Telephone Encounter (Signed)
Pt pharmacy requesting pt Ambien. Per notes on file, pt medication was last refilled 07-27-14 with 30 tablets and 1 refill. Called pt pharmacy and spoke with Harrold DonathNathan and per Harrold DonathNathan, pt still have one refill and to disregard the request.

## 2014-09-09 ENCOUNTER — Ambulatory Visit (HOSPITAL_COMMUNITY): Payer: Self-pay | Admitting: Psychology

## 2014-09-09 ENCOUNTER — Telehealth (HOSPITAL_COMMUNITY): Payer: Self-pay | Admitting: *Deleted

## 2014-09-09 NOTE — Telephone Encounter (Signed)
Pt pharmacy requesting pt Zolpidem. Per our records, pt was put on Belsomra 08-25-14. Called Heather at pt pharmacy and informed her that pt is not on Belsomra and a script was printed and given to pt.

## 2014-09-16 ENCOUNTER — Encounter: Payer: Self-pay | Admitting: Obstetrics and Gynecology

## 2014-09-16 ENCOUNTER — Ambulatory Visit (INDEPENDENT_AMBULATORY_CARE_PROVIDER_SITE_OTHER): Payer: Medicare Other | Admitting: Obstetrics and Gynecology

## 2014-09-16 VITALS — BP 110/70 | Ht 71.0 in | Wt 201.0 lb

## 2014-09-16 DIAGNOSIS — N951 Menopausal and female climacteric states: Secondary | ICD-10-CM

## 2014-09-16 DIAGNOSIS — R05 Cough: Secondary | ICD-10-CM

## 2014-09-16 DIAGNOSIS — Z113 Encounter for screening for infections with a predominantly sexual mode of transmission: Secondary | ICD-10-CM

## 2014-09-16 DIAGNOSIS — R059 Cough, unspecified: Secondary | ICD-10-CM

## 2014-09-16 DIAGNOSIS — Z01419 Encounter for gynecological examination (general) (routine) without abnormal findings: Secondary | ICD-10-CM

## 2014-09-16 MED ORDER — ESTRADIOL 1 MG PO TABS: 1.0000 mg | ORAL_TABLET | Freq: Every day | ORAL | Status: DC

## 2014-09-16 NOTE — Progress Notes (Signed)
Patient ID: [name], female   DOB: February 12, 1966, 48 y.o.   MRN: [id number] Pt here today for her annual exam. Pt states that she wants to discuss her hot flashes.

## 2014-09-16 NOTE — Progress Notes (Signed)
Patient ID: Heath LarkFelice A Pizzi, female   DOB: [birth year], 48 y.o.   MRN: [id number]  Assessment:  Annual Gyn Exam   Plan:  1. pap smear done, next pap due 3 yrs 2. return annually or prn 3    Annual mammogram advised 4.   Hep C, HIV, Syphilis, GCC testing done Subjective:  Heath LarkFelice A Neis is a 48 y.o. female No obstetric history on file. who presents for annual exam. No LMP recorded. Patient has had a hysterectomy. The patient has complaints today of intermittent hot flashes that started 2 years ago.  She lists sleep disturbances as an associated symptom.  She is still unable to sleep even when taking a sleep aid.  She will experience decreased energy throughout the day but states that she is able to recover without any problems.  She denies any abnormal weight gain or loss.  She had a mammogram this year.  Her breast reduction surgery was done by Dr. Willette AlmaKuntegenis.  She had a total abdominal hysterectomy in 2000.    She would also like to receive STD testing today because her ex-husband cheated on her 6 years ago.  She denies vaginal discharge as an associated symptom.    The following portions of the patient's history were reviewed and updated as appropriate: allergies, current medications, past family history, past medical history, past social history, past surgical history and problem list. Past Medical History  Diagnosis Date  . Depression   . Diabetes mellitus   . LBP (low back pain)   . BMI (body mass index) 20.0-29.9 2008 192 lbs  . GERD (gastroesophageal reflux disease) 2002    202 lbs  . Irritable bowel syndrome 2008    diarrhea predominant MEDS TRIED: BENTYL, LEVBID  . RUQ abdominal pain 2004 HIDA NL CTA w/ IVC-FATTY LIVER, ANGIOMYOLIPOMA LEFT KIDNEY    2008 HIDA NL  . PTSD (post-traumatic stress disorder)     Past Surgical History  Procedure Laterality Date  . Total abdominal hysterectomy  2000 fibroids/DUB  . Shoulder surgery  1997 RIGHT  . Breast reduction surgery  2006  .  Lumbar disc surgery  2005  . Ankle surgery  RIGHT  . Colonoscopy  2008 RMR    PAN-COLONIC TICS, NL TI  . Upper gastrointestinal endoscopy  2008 RMR    [facility] HH o/w NL    Current outpatient prescriptions:Amlodipine-Valsartan-HCTZ 5-160-25 MG TABS, Take 1 tablet by mouth daily., Disp: , Rfl: ;  aspirin 81 MG tablet, Take 81 mg by mouth daily., Disp: , Rfl: ;  atorvastatin (LIPITOR) 10 MG tablet, Take 1 tablet by mouth daily., Disp: , Rfl: ;  buPROPion (WELLBUTRIN XL) 150 MG 24 hr tablet, Take 1 tablet (150 mg total) by mouth every morning., Disp: 30 tablet, Rfl: 2 Cholecalciferol (VITAMIN D PO), Take 5,000 Units by mouth daily., Disp: , Rfl: ;  clonazePAM (KLONOPIN) 0.5 MG tablet, Take 0.5 mg by mouth 3 (three) times daily as needed for anxiety., Disp: , Rfl: ;  dicyclomine (BENTYL) 10 MG capsule, TAKE 1-2 CAPSULES 30 MINUTES PRIOR TO MEALS AND AT BEDTIME., Disp: 120 capsule, Rfl: 3;  DULoxetine (CYMBALTA) 60 MG capsule, Take 1 capsule (60 mg total) by mouth daily., Disp: 30 capsule, Rfl: 2 fexofenadine (ALLEGRA) 180 MG tablet, Take 180 mg by mouth daily., Disp: , Rfl: ;  furosemide (LASIX) 20 MG tablet, Take 20 mg by mouth 2 (two) times daily.  , Disp: , Rfl: ;  Liraglutide (VICTOZA) 18 MG/3ML SOPN, Inject 1.8 mg into  the skin daily., Disp: , Rfl: ;  LYRICA 75 MG capsule, Take 75 mg by mouth 2 (two) times daily. , Disp: , Rfl:  metFORMIN (GLUMETZA) 500 MG (MOD) 24 hr tablet, Take 500 mg by mouth daily with breakfast.  , Disp: , Rfl: ;  metoprolol (LOPRESSOR) 100 MG tablet, Take 100 mg by mouth 2 (two) times daily. , Disp: , Rfl: ;  Multiple Vitamin (MULTIVITAMIN) capsule, Take 1 capsule by mouth daily.  , Disp: , Rfl: ;  ONE TOUCH ULTRA TEST test strip, , Disp: , Rfl:  oxyCODONE (ROXICODONE) 15 MG immediate release tablet, Take 30 mg by mouth every 8 (eight) hours as needed. , Disp: , Rfl: ;  pantoprazole (PROTONIX) 40 MG tablet, TAKE (1) TABLET BY MOUTH ONCE DAILY., Disp: 30 tablet, Rfl: 5;  QC PEN NEEDLES  31G X 6 MM MISC, , Disp: , Rfl: ;  Suvorexant (BELSOMRA) 15 MG TABS, Take 15 mg by mouth at bedtime., Disp: 30 tablet, Rfl: 2 topiramate (TOPAMAX) 50 MG tablet, Take 1 tablet (50 mg total) by mouth 2 (two) times daily., Disp: 60 tablet, Rfl: 12;  traZODone (DESYREL) 50 MG tablet, Take 50 mg by mouth at bedtime., Disp: , Rfl: ;  zolpidem (AMBIEN) 10 MG tablet, Take 1 tablet (10 mg total) by mouth at bedtime as needed for sleep., Disp: 30 tablet, Rfl: 1;  lidocaine (LIDODERM) 5 %, 1 patch every 12 (twelve) hours., Disp: , Rfl:   Review of Systems Constitutional: negative Gastrointestinal: negative Genitourinary: negative  Objective:  BP 110/70  Ht 5\' 11"  (1.803 m)  Wt 201 lb (91.173 kg)  BMI 28.05 kg/m2   BMI: Body mass index is 28.05 kg/(m^2).  General Appearance: Alert, appropriate appearance for age. No acute distress HEENT: Grossly normal Neck / Thyroid:  Cardiovascular: RRR; normal S1, S2, no murmur Lungs: CTA bilaterally Back: No CVAT Breast Exam: Normal to inspection and No masses or nodes.No dimpling, nipple retraction or discharge. Gastrointestinal: Soft, non-tender, no masses or organomegaly Pelvic Exam: External genitalia: normal general appearance Vaginal: normal mucosa without prolapse or lesions and normal without tenderness, induration or masses Cervix: removed surgically Adnexa: removed surgically Uterus: removed surgically Rectovaginal: normal rectal, no masses Lymphatic Exam: Non-palpable nodes in neck, clavicular, axillary, or inguinal regions  Skin: no rash or abnormalities Neurologic: Normal gait and speech, no tremor  Psychiatric: Alert and oriented, appropriate affect.  Urinalysis:Not done  Christin BachJohn Puja Caffey. MD Pgr [phone] 3:50 PM  This chart was scribed for Tilda BurrowJohn Shaya Reddick V, MD by Carl Bestelina Holson, ED Scribe. This patient was seen in Room 3 and the patient's care was started at 3:50 PM.

## 2014-09-16 NOTE — Patient Instructions (Signed)
Menopause Menopause is the normal time of life when menstrual periods stop completely. Menopause is complete when you have missed 12 consecutive menstrual periods. It usually occurs between the ages of 48 years and 55 years. Very rarely does a woman develop menopause before the age of 40 years. At menopause, your ovaries stop producing the female hormones estrogen and progesterone. This can cause undesirable symptoms and also affect your health. Sometimes the symptoms may occur 4-5 years before the menopause begins. There is no relationship between menopause and:  Oral contraceptives.  Number of children you had.  Race.  The age your menstrual periods started (menarche). Heavy smokers and very thin women may develop menopause earlier in life. CAUSES  The ovaries stop producing the female hormones estrogen and progesterone.  Other causes include:  Surgery to remove both ovaries.  The ovaries stop functioning for no known reason.  Tumors of the pituitary gland in the brain.  Medical disease that affects the ovaries and hormone production.  Radiation treatment to the abdomen or pelvis.  Chemotherapy that affects the ovaries. SYMPTOMS   Hot flashes.  Night sweats.  Decrease in sex drive.  Vaginal dryness and thinning of the vagina causing painful intercourse.  Dryness of the skin and developing wrinkles.  Headaches.  Tiredness.  Irritability.  Memory problems.  Weight gain.  Bladder infections.  Hair growth of the face and chest.  Infertility. More serious symptoms include:  Loss of bone (osteoporosis) causing breaks (fractures).  Depression.  Hardening and narrowing of the arteries (atherosclerosis) causing heart attacks and strokes. DIAGNOSIS   When the menstrual periods have stopped for 12 straight months.  Physical exam.  Hormone studies of the blood. TREATMENT  There are many treatment choices and nearly as many questions about them. The  decisions to treat or not to treat menopausal changes is an individual choice made with your health care provider. Your health care provider can discuss the treatments with you. Together, you can decide which treatment will work best for you. Your treatment choices may include:   Hormone therapy (estrogen and progesterone).  Non-hormonal medicines.  Treating the individual symptoms with medicine (for example antidepressants for depression).  Herbal medicines that may help specific symptoms.  Counseling by a psychiatrist or psychologist.  Group therapy.  Lifestyle changes including:  Eating healthy.  Regular exercise.  Limiting caffeine and alcohol.  Stress management and meditation.  No treatment. HOME CARE INSTRUCTIONS   Take the medicine your health care provider gives you as directed.  Get plenty of sleep and rest.  Exercise regularly.  Eat a diet that contains calcium (good for the bones) and soy products (acts like estrogen hormone).  Avoid alcoholic beverages.  Do not smoke.  If you have hot flashes, dress in layers.  Take supplements, calcium, and vitamin D to strengthen bones.  You can use over-the-counter lubricants or moisturizers for vaginal dryness.  Group therapy is sometimes very helpful.  Acupuncture may be helpful in some cases. SEEK MEDICAL CARE IF:   You are not sure you are in menopause.  You are having menopausal symptoms and need advice and treatment.  You are still having menstrual periods after age 55 years.  You have pain with intercourse.  Menopause is complete (no menstrual period for 12 months) and you develop vaginal bleeding.  You need a referral to a specialist (gynecologist, psychiatrist, or psychologist) for treatment. SEEK IMMEDIATE MEDICAL CARE IF:   You have severe depression.  You have excessive vaginal bleeding.    You fell and think you have a broken bone.  You have pain when you urinate.  You develop leg or  chest pain.  You have a fast pounding heart beat (palpitations).  You have severe headaches.  You develop vision problems.  You feel a lump in your breast.  You have abdominal pain or severe indigestion. Document Released: 01/26/2004 Document Revised: 07/08/2013 Document Reviewed: 06/04/2013 Exit[employer] Patient Information 2015 [employer]. This information is not intended to replace advice given to you by your health care provider. Make sure you discuss any questions you have with your health care provider.  

## 2014-09-17 LAB — RPR

## 2014-09-17 LAB — HIV ANTIBODY (ROUTINE TESTING W REFLEX): HIV 1&2 Ab, 4th Generation: NONREACTIVE

## 2014-09-17 LAB — HEPATITIS C ANTIBODY: HCV Ab: NEGATIVE

## 2014-09-21 ENCOUNTER — Ambulatory Visit (INDEPENDENT_AMBULATORY_CARE_PROVIDER_SITE_OTHER): Payer: Medicare Other | Admitting: Psychiatry

## 2014-09-21 ENCOUNTER — Encounter (HOSPITAL_COMMUNITY): Payer: Self-pay | Admitting: Psychiatry

## 2014-09-21 VITALS — BP 115/88 | HR 84 | Ht 71.0 in | Wt 203.0 lb

## 2014-09-21 DIAGNOSIS — F332 Major depressive disorder, recurrent severe without psychotic features: Secondary | ICD-10-CM

## 2014-09-21 DIAGNOSIS — F323 Major depressive disorder, single episode, severe with psychotic features: Secondary | ICD-10-CM

## 2014-09-21 MED ORDER — ZOLPIDEM TARTRATE 10 MG PO TABS: 10.0000 mg | ORAL_TABLET | Freq: Every evening | ORAL | Status: DC | PRN
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
## 2014-10-05 ENCOUNTER — Other Ambulatory Visit (HOSPITAL_COMMUNITY): Payer: Self-pay | Admitting: Psychiatry

## 2014-10-05 ENCOUNTER — Other Ambulatory Visit: Payer: Self-pay | Admitting: Gastroenterology

## 2014-10-07 ENCOUNTER — Telehealth: Payer: Self-pay | Admitting: *Deleted

## 2014-10-07 NOTE — Telephone Encounter (Signed)
Pt wanted results from her blood work from her last visit. Pt aware that labs are negative.

## 2014-10-12 ENCOUNTER — Ambulatory Visit (INDEPENDENT_AMBULATORY_CARE_PROVIDER_SITE_OTHER): Payer: Medicare Other | Admitting: Psychology

## 2014-10-12 DIAGNOSIS — F332 Major depressive disorder, recurrent severe without psychotic features: Secondary | ICD-10-CM

## 2014-10-12 DIAGNOSIS — G894 Chronic pain syndrome: Secondary | ICD-10-CM

## 2014-10-28 ENCOUNTER — Encounter (HOSPITAL_COMMUNITY): Payer: Self-pay | Admitting: Psychology
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
## 2014-11-02 ENCOUNTER — Other Ambulatory Visit (HOSPITAL_COMMUNITY): Payer: Self-pay | Admitting: Psychiatry

## 2014-11-15 ENCOUNTER — Ambulatory Visit (HOSPITAL_COMMUNITY): Payer: Self-pay | Admitting: Psychology

## 2014-11-23 ENCOUNTER — Ambulatory Visit (INDEPENDENT_AMBULATORY_CARE_PROVIDER_SITE_OTHER): Payer: Medicare Other | Admitting: Psychiatry

## 2014-11-23 ENCOUNTER — Encounter (HOSPITAL_COMMUNITY): Payer: Self-pay | Admitting: Psychiatry

## 2014-11-23 VITALS — BP 123/82 | HR 75 | Ht 71.0 in | Wt 204.4 lb

## 2014-11-23 DIAGNOSIS — F332 Major depressive disorder, recurrent severe without psychotic features: Secondary | ICD-10-CM

## 2014-11-23 DIAGNOSIS — F323 Major depressive disorder, single episode, severe with psychotic features: Secondary | ICD-10-CM

## 2014-11-23 MED ORDER — BUPROPION HCL ER (XL) 150 MG PO TB24: 150.0000 mg | ORAL_TABLET | ORAL | Status: DC

## 2014-11-23 MED ORDER — DULOXETINE HCL 60 MG PO CPEP: 60.0000 mg | ORAL_CAPSULE | Freq: Every day | ORAL | Status: DC

## 2014-11-23 MED ORDER — ALPRAZOLAM 0.5 MG PO TABS: 0.5000 mg | ORAL_TABLET | Freq: Three times a day (TID) | ORAL | Status: DC | PRN
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
## 2014-11-26 ENCOUNTER — Ambulatory Visit (HOSPITAL_COMMUNITY): Payer: Self-pay | Admitting: Psychology

## 2014-12-01 ENCOUNTER — Telehealth: Payer: Self-pay | Admitting: Gastroenterology

## 2014-12-03 NOTE — Telephone Encounter (Signed)
Will be at 2 years with no visit end of April. Will refill enough until them and have staff reach out to schedule him a follow-up visit.

## 2014-12-03 NOTE — Telephone Encounter (Signed)
Will be at 2 years with no visit end of April. Will refill enough until them. Please reach out and schedule a follow-up for him. Thanks

## 2014-12-06 ENCOUNTER — Encounter: Payer: Self-pay | Admitting: Gastroenterology

## 2014-12-06 NOTE — Telephone Encounter (Signed)
APPOINTMENT MADE AND LETTER SENT °

## 2014-12-24 ENCOUNTER — Encounter (HOSPITAL_COMMUNITY): Payer: Self-pay | Admitting: Emergency Medicine

## 2014-12-24 ENCOUNTER — Emergency Department (HOSPITAL_COMMUNITY)
Admission: EM | Admit: 2014-12-24 | Discharge: 2014-12-24 | Disposition: A | Discharge status: Home / Self Care | Payer: Medicare Other | Attending: Emergency Medicine | Admitting: Emergency Medicine

## 2014-12-24 ENCOUNTER — Emergency Department (HOSPITAL_COMMUNITY): Payer: Medicare Other

## 2014-12-24 ENCOUNTER — Ambulatory Visit (INDEPENDENT_AMBULATORY_CARE_PROVIDER_SITE_OTHER): Payer: Medicare Other | Admitting: Psychology

## 2014-12-24 DIAGNOSIS — E119 Type 2 diabetes mellitus without complications: Secondary | ICD-10-CM | POA: Diagnosis not present

## 2014-12-24 DIAGNOSIS — F332 Major depressive disorder, recurrent severe without psychotic features: Secondary | ICD-10-CM

## 2014-12-24 DIAGNOSIS — F329 Major depressive disorder, single episode, unspecified: Secondary | ICD-10-CM | POA: Diagnosis not present

## 2014-12-24 DIAGNOSIS — R079 Chest pain, unspecified: Secondary | ICD-10-CM | POA: Diagnosis not present

## 2014-12-24 DIAGNOSIS — Z7982 Long term (current) use of aspirin: Secondary | ICD-10-CM | POA: Insufficient documentation

## 2014-12-24 DIAGNOSIS — K219 Gastro-esophageal reflux disease without esophagitis: Secondary | ICD-10-CM | POA: Insufficient documentation

## 2014-12-24 DIAGNOSIS — Z720 Tobacco use: Secondary | ICD-10-CM | POA: Insufficient documentation

## 2014-12-24 DIAGNOSIS — I10 Essential (primary) hypertension: Secondary | ICD-10-CM | POA: Diagnosis not present

## 2014-12-24 DIAGNOSIS — F4310 Post-traumatic stress disorder, unspecified: Secondary | ICD-10-CM | POA: Diagnosis not present

## 2014-12-24 DIAGNOSIS — Z79899 Other long term (current) drug therapy: Secondary | ICD-10-CM | POA: Diagnosis not present

## 2014-12-24 DIAGNOSIS — G894 Chronic pain syndrome: Secondary | ICD-10-CM

## 2014-12-24 HISTORY — DX: Essential (primary) hypertension: I10

## 2014-12-24 LAB — CBC
HCT: 42.1 % (ref 36.0–46.0)
Hemoglobin: 14.4 g/dL (ref 12.0–15.0)
MCH: 31.2 pg (ref 26.0–34.0)
MCHC: 34.2 g/dL (ref 30.0–36.0)
MCV: 91.1 fL (ref 78.0–100.0)
Platelets: 325 10*3/uL (ref 150–400)
RBC: 4.62 MIL/uL (ref 3.87–5.11)
RDW: 13 % (ref 11.5–15.5)
WBC: 9.7 10*3/uL (ref 4.0–10.5)

## 2014-12-24 LAB — BASIC METABOLIC PANEL
Anion gap: 7 (ref 5–15)
BUN: 10 mg/dL (ref 6–23)
CO2: 32 mmol/L (ref 19–32)
CREATININE: 0.91 mg/dL (ref 0.50–1.10)
Calcium: 9.3 mg/dL (ref 8.4–10.5)
Chloride: 98 mmol/L (ref 96–112)
GFR, EST AFRICAN AMERICAN: 85 mL/min — AB (ref >90)
GFR, EST NON AFRICAN AMERICAN: 73 mL/min — AB (ref >90)
Glucose, Bld: 78 mg/dL (ref 70–99)
Potassium: 3 mmol/L — ABNORMAL LOW (ref 3.5–5.1)
Sodium: 137 mmol/L (ref 135–145)

## 2014-12-24 LAB — TROPONIN I
Troponin I: <0.03 ng/mL (ref <0.031)
Troponin I: <0.03 ng/mL (ref <0.031)

## 2014-12-24 LAB — D-DIMER, QUANTITATIVE (NOT AT ARMC)

## 2014-12-24 MED ORDER — HYDROCODONE-ACETAMINOPHEN 5-325 MG PO TABS: 1.0000 | ORAL_TABLET | Freq: Four times a day (QID) | ORAL | Status: DC | PRN

## 2014-12-24 NOTE — Discharge Instructions (Signed)
Follow up with your family md next week for recheck °

## 2014-12-24 NOTE — ED Provider Notes (Signed)
CSN: [id number]     Arrival date & time 12/24/14  1533 History   First MD Initiated Contact with Patient 12/24/14 1710     Chief Complaint  Patient presents with  . Chest Pain     (Consider location/radiation/quality/duration/timing/severity/associated sxs/prior Treatment) Patient is a 49 y.o. female presenting with chest pain. The history is provided by the patient (the pt complains of some chest pain.  off and on for a couple days.  worse with inspiration).  Chest Pain Pain location:  L chest Pain quality: aching   Pain radiates to:  Does not radiate Pain radiates to the back: no   Pain severity:  Mild Onset quality:  Gradual Timing:  Intermittent Progression:  Waxing and waning Chronicity:  New Context: breathing   Associated symptoms: no abdominal pain, no back pain, no cough, no fatigue and no headache     Past Medical History  Diagnosis Date  . Depression   . Diabetes mellitus   . LBP (low back pain)   . BMI (body mass index) 20.0-29.9 2008 192 lbs  . GERD (gastroesophageal reflux disease) 2002    202 lbs  . Irritable bowel syndrome 2008    diarrhea predominant MEDS TRIED: BENTYL, LEVBID  . RUQ abdominal pain 2004 HIDA NL CTA w/ IVC-FATTY LIVER, ANGIOMYOLIPOMA LEFT KIDNEY    2008 HIDA NL  . PTSD (post-traumatic stress disorder)   . Hypertension    Past Surgical History  Procedure Laterality Date  . Total abdominal hysterectomy  2000 fibroids/DUB  . Shoulder surgery  1997 RIGHT  . Breast reduction surgery  2006  . Lumbar disc surgery  2005  . Ankle surgery  RIGHT  . Colonoscopy  2008 RMR    PAN-COLONIC TICS, NL TI  . Upper gastrointestinal endoscopy  2008 RMR    SML HH o/w NL   Family History  Problem Relation Age of Onset  . Breast cancer Maternal Aunt   . Anxiety disorder Maternal Aunt   . Colon polyps Neg Hx   . Colon cancer Neg Hx   . ADD / ADHD Neg Hx   . Bipolar disorder Neg Hx   . Depression Neg Hx   . OCD Neg Hx   . Paranoid behavior Neg Hx    . Schizophrenia Neg Hx   . Sexual abuse Neg Hx   . Physical abuse Neg Hx   . Anxiety disorder Mother   . Stroke Mother   . COPD Mother   . Hypertension Mother   . Thyroid disease Mother   . Diabetes Mother   . Drug abuse Daughter   . Anxiety disorder Daughter   . Seizures Sister   . Alcohol abuse Maternal Uncle   . Mental illness Cousin   . Dementia Maternal Aunt   . Anxiety disorder Sister   . Anxiety disorder Sister   . Drug abuse Sister   . Early death Sister   . Heart disease Sister   . Heart attack Sister    History  Substance Use Topics  . Smoking status: Current Every Day Smoker -- 0.50 packs/day for 2 years    Types: Cigarettes    Last Attempt to Quit: 09/12/2013  . Smokeless tobacco: Never Used     Comment: 10 cigs a day as of 03/24/2013  . Alcohol Use: No   OB History    No data available     Review of Systems  Constitutional: Negative for appetite change and fatigue.  HENT: Negative for congestion, ear  discharge and sinus pressure.   Eyes: Negative for discharge.  Respiratory: Negative for cough.   Cardiovascular: Positive for chest pain.  Gastrointestinal: Negative for abdominal pain and diarrhea.  Genitourinary: Negative for frequency and hematuria.  Musculoskeletal: Negative for back pain.  Skin: Negative for rash.  Neurological: Negative for seizures and headaches.  Psychiatric/Behavioral: Negative for hallucinations.      Allergies  Neurontin  Home Medications   Prior to Admission medications   Medication Sig Start Date End Date Taking? Authorizing Provider  ALPRAZolam Prudy Feeler) 0.5 MG tablet Take 1 tablet (0.5 mg total) by mouth 3 (three) times daily as needed for sleep or anxiety. 11/23/14 11/23/15 Yes Diannia Ruder, MD  aspirin 81 MG tablet Take 81 mg by mouth every morning.    Yes Historical Provider, MD  atorvastatin (LIPITOR) 10 MG tablet Take 1 tablet by mouth daily. 09/09/13  Yes Historical Provider, MD  buPROPion (WELLBUTRIN XL) 150 MG 24  hr tablet Take 1 tablet (150 mg total) by mouth every morning. 11/23/14 11/23/15 Yes Diannia Ruder, MD  Cholecalciferol (VITAMIN D PO) Take 5,000 Units by mouth daily.   Yes Historical Provider, MD  dicyclomine (BENTYL) 10 MG capsule TAKE 1-2 CAPSULES 30 MINUTES PRIOR TO MEALS AND AT BEDTIME. 10/05/14  Yes Tiffany Kocher, PA-C  DULoxetine (CYMBALTA) 60 MG capsule Take 1 capsule (60 mg total) by mouth daily. 11/23/14 11/23/15 Yes Diannia Ruder, MD  estradiol (ESTRACE) 1 MG tablet Take 1 tablet (1 mg total) by mouth daily. 09/16/14 09/16/15 Yes Tilda Burrow, MD  fexofenadine (ALLEGRA) 180 MG tablet Take 180 mg by mouth every morning.    Yes Historical Provider, MD  furosemide (LASIX) 20 MG tablet Take 20 mg by mouth 2 (two) times daily.     Yes Historical Provider, MD  lidocaine (LIDODERM) 5 % Place 1 patch onto the skin every 12 (twelve) hours as needed (for pain).  08/24/13  Yes Historical Provider, MD  Liraglutide (VICTOZA) 18 MG/3ML SOPN Inject 1.8 mg into the skin every evening.    Yes Historical Provider, MD  LYRICA 75 MG capsule Take 75 mg by mouth 2 (two) times daily.  03/10/13  Yes Historical Provider, MD  metFORMIN (GLUCOPHAGE-XR) 500 MG 24 hr tablet Take 500 mg by mouth daily. 11/02/14  Yes Historical Provider, MD  metoprolol (LOPRESSOR) 100 MG tablet Take 100 mg by mouth 2 (two) times daily.  03/23/11  Yes Historical Provider, MD  Multiple Vitamin (MULTIVITAMIN) capsule Take 1 capsule by mouth daily.     Yes Historical Provider, MD  oxycodone (ROXICODONE) 30 MG immediate release tablet Take 30 mg by mouth every 6 (six) hours as needed. For pain 10/28/14  Yes Historical Provider, MD  pantoprazole (PROTONIX) 40 MG tablet TAKE (1) TABLET BY MOUTH ONCE DAILY. 12/03/14  Yes Sherril Cong, NP  topiramate (TOPAMAX) 50 MG tablet Take 1 tablet (50 mg total) by mouth 2 (two) times daily. Patient taking differently: Take 50 mg by mouth 2 (two) times daily as needed (for headache pain).  09/16/13  Yes Suanne Marker, MD  valsartan-hydrochlorothiazide (DIOVAN-HCT) 160-25 MG per tablet Take 1 tablet by mouth daily. 11/02/14  Yes Historical Provider, MD  VOLTAREN 1 % GEL Apply topically daily as needed. 10/28/14  Yes Historical Provider, MD  zolpidem (AMBIEN) 10 MG tablet Take 10 mg by mouth at bedtime as needed for sleep.  09/21/14  Yes Historical Provider, MD  HYDROcodone-acetaminophen (NORCO/VICODIN) 5-325 MG per tablet Take 1 tablet by mouth every  6 (six) hours as needed. 12/24/14   Benny Lennert, MD  ONE TOUCH ULTRA TEST test strip  08/06/13   Historical Provider, MD  QC PEN NEEDLES 31G X 6 MM MISC  05/28/11   Historical Provider, MD   BP 107/87 mmHg  Pulse 70  Temp(Src) 98.8 F (37.1 C) (Oral)  Resp 16  Ht  (1.803 m)  Wt 207 lb (93.895 kg)  BMI 28.88 kg/m2  SpO2 100% Physical Exam  Constitutional: She is oriented to person, place, and time. She appears well-developed.  HENT:  Head: Normocephalic.  Eyes: Conjunctivae and EOM are normal. No scleral icterus.  Neck: Neck supple. No thyromegaly present.  Cardiovascular: Normal rate and regular rhythm.  Exam reveals no gallop and no friction rub.   No murmur heard. Pulmonary/Chest: No stridor. She has no wheezes. She has no rales. She exhibits no tenderness.  Abdominal: She exhibits no distension. There is no tenderness. There is no rebound.  Musculoskeletal: Normal range of motion. She exhibits no edema.  Lymphadenopathy:    She has no cervical adenopathy.  Neurological: She is oriented to person, place, and time. She exhibits normal muscle tone. Coordination normal.  Skin: No rash noted. No erythema.  Psychiatric: She has a normal mood and affect. Her behavior is normal.    ED Course  Procedures (including critical care time) Labs Review Labs Reviewed  BASIC METABOLIC PANEL - Abnormal; Notable for the following:    Potassium 3.0 (*)    GFR calc non Af Amer 73 (*)    GFR calc Af Amer 85 (*)    All other components within  normal limits  CBC  TROPONIN I  D-DIMER, QUANTITATIVE  TROPONIN I    Imaging Review Dg Chest 2 View  12/24/2014   CLINICAL DATA:  Chest pain, shortness of breath, left arm pain  EXAM: CHEST  2 VIEW  COMPARISON:  05/25/2014  FINDINGS: The heart size and mediastinal contours are within normal limits. Both lungs are clear. The visualized skeletal structures are unremarkable.  IMPRESSION: No active cardiopulmonary disease.   Electronically Signed   By: Christiana Pellant M.D.   On: 12/24/2014 16:00     EKG Interpretation   Date/Time:  Friday December 24 2014 15:43:14 EST Ventricular Rate:  73 PR Interval:  180 QRS Duration: 76 QT Interval:  396 QTC Calculation: 436 R Axis:   47 Text Interpretation:  Normal sinus rhythm Nonspecific T wave abnormality  Abnormal ECG Confirmed by Kadejah Sandiford  MD, Takuya Lariccia [phone]) on 12/24/2014 8:15:29  PM      MDM   Final diagnoses:  Chest pain at rest    Chest pain,  Nl studies,   Hx with nl treadmill,   Will tx symptoms and have pt follow up with pcp next week    Benny Lennert, MD 12/24/14 2018

## 2014-12-24 NOTE — ED Notes (Signed)
Patient complaining of chest pain off and on x 2 weeks. States pain is radiating down left arm and back.

## 2014-12-24 NOTE — ED Notes (Signed)
Lab at bedside

## 2015-01-03 ENCOUNTER — Ambulatory Visit: Payer: Self-pay | Admitting: Gastroenterology

## 2015-01-03 ENCOUNTER — Other Ambulatory Visit: Payer: Self-pay | Admitting: Gastroenterology

## 2015-01-06 ENCOUNTER — Other Ambulatory Visit: Payer: Self-pay

## 2015-01-09 MED ORDER — DICYCLOMINE HCL 10 MG PO CAPS: ORAL_CAPSULE | ORAL | Status: DC

## 2015-01-19 ENCOUNTER — Ambulatory Visit (INDEPENDENT_AMBULATORY_CARE_PROVIDER_SITE_OTHER): Payer: Medicare Other | Admitting: Psychology

## 2015-01-19 DIAGNOSIS — G894 Chronic pain syndrome: Secondary | ICD-10-CM

## 2015-01-19 DIAGNOSIS — F332 Major depressive disorder, recurrent severe without psychotic features: Secondary | ICD-10-CM

## 2015-01-20 ENCOUNTER — Encounter (HOSPITAL_COMMUNITY): Payer: Self-pay | Admitting: Psychology

## 2015-01-20 NOTE — Progress Notes (Signed)
Patient:  [name]   DOB: 09/27/1966  MR Number: [id number]  [facility]: [facility] [address] [place] [id number] Dept: [phone]  Start: 2 PM End: 3 PM  Provider/Observer:     Hershal CoriaJohn R Azeez Dunker PSYD  Chief Complaint:      Chief Complaint  Patient presents with  . Depression  . Anxiety  . Stress    Reason For Service:     The patient was referred because of increasing symptoms of depression. Relationship issues, financial problems, medical issues, and loss of her brother in 2002 were all problematic situations for her to deal with. Most recently, major conflicts between her, her knees, and her daughter have also been quite disruptive. At this point, the patient has continued to struggle with severe back issues along with severe depression.  Interventions Strategy:  Cognitive/behavioral psychotherapy  Participation Level:   Active  Participation Quality:  Appropriate      Behavioral Observation:  Well Groomed, Alert, and Appropriate.   Current Psychosocial Factors: The patient reports that her daughter is continued to be treated in the hospital and is in preparation for heart surgery. The patient's daughter developed a severe infection in her heart that has progressively destroyed one of her heart valves. This is likely due to intravenous drug use causing the infection. And while they are going to replace the valve with an artificial valve there are other issues that were discovered. Her daughter has hardening and thickening of the heart wall which is likely a genetic condition as several other members of the family have had this condition. The patient herself has been referred back to the patient's cardiologist to see if these issues are happening with her Medi-Cal for some of the cardiovascular issues she has had over the recent years. The patient reports that she is very concerned  about her daughter even though she tries not to be as the daughter's life is been in danger for quite some time regarding severe substance abuse. However, the patient reports that she continues to be quite worried about what is going to happen to her daughter. The daughter is claiming that she is completely stopped any drug use. Another major issue that is happening recently is the fact that the patient's sister was arrested for sale and delivery and trafficking of controlled substance. The patient is known that her sister was engaged in these types of activities for quite some time but was unaware of the extent. The patient is not able to help out with her sister's children at all issues are taking care of another sister's children who died years ago and raising them as well as taking care of her daughter's children.    Content of Session:   Reviewed current symptoms and continued work on therapeutic interventions  Current Status:   The patient continues to have orthopedic issues causing back pain and leg pain. She also has been showing signs of cardiovascular issues. During her last visit I insisted that the patient go to see either her cardiologist or the emergency department. Her cardiologist was not available so she was seen at the emergency department and now has a follow-up appointment with her cardiologist. Information is happening since my insistence on her being checked out and that is of discovery's of genetic conditions that are daughter is dealing with regarding thickening and hardening of the heart wall.  Patient Progress:   Stable  Target Goals:  Target goals include reducing symptoms of depression, anxiety, and increase coping skills.  Last Reviewed:   01/19/2015  Goals Addressed Today:    Today we worked on adjusted issues with the dissolution of her relationship with "PJ"  Impression/Diagnosis:   The patient has had significant psychosocial stressors throughout her life going on  going back to childhood. Most recently she has been taking care of her g as well as her sister's children. Her sister died several years ago and she has raised her sister's children. She also adopted one of her sister's children her. The patient has her own daughter who has that was serious and significant substance abuse. The patient has recurrent major depressive events.randmother who had a severe stroke  Diagnosis:    Axis I:  Major depressive disorder, recurrent, severe without psychotic features  Pain syndrome, chronic      Axis II: No diagnosis

## 2015-01-20 NOTE — Progress Notes (Signed)
   Patient:  [name]   DOB: Jan 08, 1966  MR Number: [id number]  Location: [facility] [address] [place] [address] Dept: [phone]  Start: 11 AM End: 12 PM  Provider/Observer:     Hershal CoriaJohn R Rodenbough PSYD  Chief Complaint:      Chief Complaint  Patient presents with  . Agitation  . Anxiety  . Depression  . Stress    Reason For Service:     The patient was referred because of increasing symptoms of depression. Relationship issues, financial problems, medical issues, and loss of her brother in 2002 were all problematic situations for her to deal with. Most recently, major conflicts between her, her knees, and her daughter have also been quite disruptive. At this point, the patient has continued to struggle with severe back issues along with severe depression.  Interventions Strategy:  Cognitive/behavioral psychotherapy  Participation Level:   Active  Participation Quality:  Appropriate      Behavioral Observation:  Well Groomed, Alert, and Appropriate.   Current Psychosocial Factors: The patient reports that her daughter has been hospitalized due to a life-threatening infection in her heart. This is likely due to an infection occurred from intravenous drug use. The infection has essentially destroyed one of her heart valves. The patient is also coping with the information related to her sister having an outstanding warrant/diagnosis being produced regarding drug sales. The stressors are significant for the patient and she often is one of twins up having to pick up the pieces after her daughter or sister had some significant issue..    Content of Session:   Reviewed current symptoms and continued work on therapeutic interventions  Current Status:   While the patient continues to deal with her depression and had been doing better after he moved away from her family members a lot of  stresses been occurring recently. Today, the patient describes symptoms that could be signs of significant cardiovascular issues for herself. She has not seen her cardiologist some time and I'm very concerned about her symptoms and have insisted that she go across the street to her cardiologist's office and if they're not able to see her this afternoon that she presented the emergency department for assessment.  Patient Progress:   Stable  Target Goals:   Target goals include reducing symptoms of depression, anxiety, and increase coping skills.  Last Reviewed:   12/24/2014  Goals Addressed Today:    Today we worked on adjusted issues with the dissolution of her relationship with "PJ"  Impression/Diagnosis:   The patient has had significant psychosocial stressors throughout her life going on going back to childhood. Most recently she has been taking care of her g as well as her sister's children. Her sister died several years ago and she has raised her sister's children. She also adopted one of her sister's children her. The patient has her own daughter who has that was serious and significant substance abuse. The patient has recurrent major depressive events.randmother who had a severe stroke  Diagnosis:    Axis I:  Major depressive disorder, recurrent, severe without psychotic features  Pain syndrome, chronic      Axis II: No diagnosis

## 2015-01-24 ENCOUNTER — Encounter (HOSPITAL_COMMUNITY): Payer: Self-pay | Admitting: *Deleted

## 2015-01-24 ENCOUNTER — Ambulatory Visit (HOSPITAL_COMMUNITY): Payer: Self-pay | Admitting: Psychiatry

## 2015-01-31 ENCOUNTER — Other Ambulatory Visit (HOSPITAL_COMMUNITY): Payer: Self-pay | Admitting: Psychiatry

## 2015-02-24 ENCOUNTER — Ambulatory Visit (HOSPITAL_COMMUNITY): Payer: Self-pay | Admitting: Psychology

## 2015-03-01 ENCOUNTER — Other Ambulatory Visit: Payer: Self-pay | Admitting: Nurse Practitioner

## 2015-03-02 ENCOUNTER — Telehealth (HOSPITAL_COMMUNITY): Payer: Self-pay | Admitting: *Deleted

## 2015-03-02 MED ORDER — DULOXETINE HCL 60 MG PO CPEP
60.0000 mg | ORAL_CAPSULE | Freq: Every day | ORAL | Status: DC
Start: 2015-03-02 — End: 2015-04-14

## 2015-03-02 MED ORDER — BUPROPION HCL ER (XL) 150 MG PO TB24: 150.0000 mg | ORAL_TABLET | ORAL | Status: DC

## 2015-03-02 NOTE — Telephone Encounter (Signed)
You may fill one month supply. Patient needs to come in

## 2015-03-02 NOTE — Telephone Encounter (Signed)
Pt pharmacuy requesting refills for pt Bupropion HCL XL 150mg  QD and Cymbalta 60 mg QD. Pt medications were both last filled 11-23-14 with 2 refills. Pt had scheduled appt for 01-24-15 but no showed for the appt. Called pt home number but mailbox full. Pt pharmacy number is [phone].

## 2015-03-02 NOTE — Telephone Encounter (Signed)
Medications refilled. Tried calling pt again and unable to leave voicemail

## 2015-03-02 NOTE — Telephone Encounter (Signed)
Per Dr. Tenny Crawoss to send in 1 months worth of refill for pt. Per Dr. Tenny Crawoss pt need follow up visit.

## 2015-03-03 NOTE — Telephone Encounter (Signed)
Last refill provided through end of April and instructions of patient needs follow-up appointment for further refills. Appears per phone note and letter that patient appt made 01/03/15 but apparent no show. Please notify patient she will need a follow-up visit for further refills.

## 2015-03-04 NOTE — Telephone Encounter (Signed)
SPOKE TO PATIENT AND MADE HER AN APPOINTMENT

## 2015-03-08 DIAGNOSIS — M5416 Radiculopathy, lumbar region: Secondary | ICD-10-CM | POA: Insufficient documentation

## 2015-03-23 ENCOUNTER — Other Ambulatory Visit: Payer: Self-pay | Admitting: Neurosurgery

## 2015-03-23 ENCOUNTER — Ambulatory Visit (INDEPENDENT_AMBULATORY_CARE_PROVIDER_SITE_OTHER): Payer: Medicare Other | Admitting: Psychology

## 2015-03-23 DIAGNOSIS — M545 Low back pain: Secondary | ICD-10-CM

## 2015-03-23 DIAGNOSIS — M542 Cervicalgia: Secondary | ICD-10-CM

## 2015-03-23 DIAGNOSIS — G894 Chronic pain syndrome: Secondary | ICD-10-CM | POA: Diagnosis not present

## 2015-03-23 DIAGNOSIS — F332 Major depressive disorder, recurrent severe without psychotic features: Secondary | ICD-10-CM

## 2015-03-29 ENCOUNTER — Encounter (INDEPENDENT_AMBULATORY_CARE_PROVIDER_SITE_OTHER): Payer: Self-pay

## 2015-03-29 ENCOUNTER — Encounter: Payer: Self-pay | Admitting: Gastroenterology

## 2015-03-29 ENCOUNTER — Other Ambulatory Visit: Payer: Self-pay

## 2015-03-29 ENCOUNTER — Ambulatory Visit (INDEPENDENT_AMBULATORY_CARE_PROVIDER_SITE_OTHER): Payer: Medicare Other | Admitting: Gastroenterology

## 2015-03-29 VITALS — BP 134/90 | HR 82 | Temp 97.9°F | Ht 71.0 in | Wt 207.4 lb

## 2015-03-29 DIAGNOSIS — R1314 Dysphagia, pharyngoesophageal phase: Secondary | ICD-10-CM | POA: Diagnosis not present

## 2015-03-29 DIAGNOSIS — K589 Irritable bowel syndrome without diarrhea: Secondary | ICD-10-CM

## 2015-03-29 MED ORDER — HYOSCYAMINE SULFATE 0.125 MG SL SUBL: 0.1250 mg | SUBLINGUAL_TABLET | SUBLINGUAL | Status: DC | PRN

## 2015-03-29 NOTE — Progress Notes (Signed)
  Referring Provider: Shah, Ashish, MD Primary Care Physician:  SHAH,ASHISH, MD  Primary GI: Dr. Rourk    Chief Complaint  Patient presents with  . Medication Refill  . IBS    HPI:   [name] is a 49 y.o. female presenting today with a history of IBS and GERD, last seen in 2014. Last Last colonoscopy in Dec 2013 by Dr. Benson. Upper endoscopy in 2008 with small hiatal hernia otherwise normal.   Has varying constipation and diarrhea. Will have abdominal discomfort, feels like has to go to bathroom, breaks out in a hot sweat, then will start throwing up and coming out the other end, too. Bentyl doesn't work well.  Takes Philip's Colon Health.    2% milk with horrible results. Uses almond milk and does well. Has gas all the time. Notes abdominal pain and bloating. No rectal bleeding. Protonix once a day. Intermittent reflux. Feels like gunk is stuck in esophagus. Globus sensation. Solid food dysphagia. Protonix once daily.   Past Medical History  Diagnosis Date  . Depression   . Diabetes mellitus   . LBP (low back pain)   . BMI (body mass index) 20.0-29.9 2008 192 lbs  . GERD (gastroesophageal reflux disease) 2002    202 lbs  . Irritable bowel syndrome 2008    diarrhea predominant MEDS TRIED: BENTYL, LEVBID  . RUQ abdominal pain 2004 HIDA NL CTA w/ IVC-FATTY LIVER, ANGIOMYOLIPOMA LEFT KIDNEY    2008 HIDA NL  . PTSD (post-traumatic stress disorder)   . Hypertension     Past Surgical History  Procedure Laterality Date  . Total abdominal hysterectomy  2000 fibroids/DUB  . Shoulder surgery  1997 RIGHT  . Breast reduction surgery  2006  . Lumbar disc surgery  2005  . Ankle surgery  RIGHT  . Colonoscopy  2008 RMR    PAN-COLONIC TICS, NL TI  . Upper gastrointestinal endoscopy  2008 RMR    SML HH o/w NL    Current Outpatient Prescriptions  Medication Sig Dispense Refill  . ALPRAZolam (XANAX) 0.5 MG tablet Take 1 tablet (0.5 mg total) by mouth 3 (three) times daily as  needed for sleep or anxiety. 90 tablet 2  . aspirin 81 MG tablet Take 81 mg by mouth every morning.     . atorvastatin (LIPITOR) 10 MG tablet Take 1 tablet by mouth daily.    . buPROPion (WELLBUTRIN XL) 150 MG 24 hr tablet Take 1 tablet (150 mg total) by mouth every morning. 30 tablet 0  . Cholecalciferol (VITAMIN D PO) Take 5,000 Units by mouth daily.    . dicyclomine (BENTYL) 10 MG capsule TAKE 1-2 CAPSULES 30 MINUTES PRIOR TO MEALS AND AT BEDTIME. (Patient not taking: Reported on 04/01/2015) 120 capsule 3  . DULoxetine (CYMBALTA) 60 MG capsule Take 1 capsule (60 mg total) by mouth daily. 30 capsule 0  . fexofenadine (ALLEGRA) 180 MG tablet Take 180 mg by mouth every morning.     . furosemide (LASIX) 20 MG tablet Take 20 mg by mouth 2 (two) times daily.      . lidocaine (LIDODERM) 5 % Place 1 patch onto the skin every 12 (twelve) hours as needed (for pain).     . Liraglutide (VICTOZA) 18 MG/3ML SOPN Inject 1.8 mg into the skin every evening.     . LYRICA 75 MG capsule Take 75 mg by mouth 2 (two) times daily.     . metFORMIN (GLUCOPHAGE-XR) 500 MG 24 hr tablet Take 500   mg by mouth daily.    . metoprolol (LOPRESSOR) 100 MG tablet Take 100 mg by mouth 2 (two) times daily.     . Multiple Vitamin (MULTIVITAMIN) capsule Take 1 capsule by mouth daily.      . ONE TOUCH ULTRA TEST test strip     . oxycodone (ROXICODONE) 30 MG immediate release tablet Take 30 mg by mouth every 6 (six) hours as needed. For pain    . pantoprazole (PROTONIX) 40 MG tablet TAKE (1) TABLET BY MOUTH ONCE DAILY. 30 tablet 2  . QC PEN NEEDLES 31G X 6 MM MISC     . topiramate (TOPAMAX) 50 MG tablet Take 1 tablet (50 mg total) by mouth 2 (two) times daily. (Patient taking differently: Take 50 mg by mouth 2 (two) times daily as needed (for headache pain). ) 60 tablet 12  . valsartan-hydrochlorothiazide (DIOVAN-HCT) 160-25 MG per tablet Take 1 tablet by mouth daily.    . VOLTAREN 1 % GEL Apply 2 g topically daily as needed (pain).      . zolpidem (AMBIEN) 10 MG tablet Take 10 mg by mouth at bedtime as needed for sleep.     . hyoscyamine (LEVSIN SL) 0.125 MG SL tablet Place 1 tablet (0.125 mg total) under the tongue every 4 (four) hours as needed. 120 tablet 11   No current facility-administered medications for this visit.    Allergies as of 03/29/2015 - Review Complete 03/29/2015  Allergen Reaction Noted  . Neurontin [gabapentin] Other (See Comments) 03/24/2013    Family History  Problem Relation Age of Onset  . Breast cancer Maternal Aunt   . Anxiety disorder Maternal Aunt   . Colon polyps Neg Hx   . Colon cancer Neg Hx   . ADD / ADHD Neg Hx   . Bipolar disorder Neg Hx   . Depression Neg Hx   . OCD Neg Hx   . Paranoid behavior Neg Hx   . Schizophrenia Neg Hx   . Sexual abuse Neg Hx   . Physical abuse Neg Hx   . Anxiety disorder Mother   . Stroke Mother   . COPD Mother   . Hypertension Mother   . Thyroid disease Mother   . Diabetes Mother   . Drug abuse Daughter   . Anxiety disorder Daughter   . Seizures Sister   . Alcohol abuse Maternal Uncle   . Mental illness Cousin   . Dementia Maternal Aunt   . Anxiety disorder Sister   . Anxiety disorder Sister   . Drug abuse Sister   . Early death Sister   . Heart disease Sister   . Heart attack Sister     History   Social History  . Marital Status: Single    Spouse Name: N/A  . Number of Children: 3  . Years of Education: 12th   Occupational History  .      [employer]   Social History Main Topics  . Smoking status: Current Every Day Smoker -- 0.50 packs/day for 2 years    Types: Cigarettes    Last Attempt to Quit: 09/12/2013  . Smokeless tobacco: Never Used     Comment: 10 cigs a day as of 03/24/2013  . Alcohol Use: No  . Drug Use: No  . Sexual Activity: Yes    Birth Control/ Protection: Surgical   Other Topics Concern  . None   Social History Narrative   Patient lives at home with her family.   Caffeine Use:   2 cups of coffee daily      Review of Systems: As mentioned in HPI  Physical Exam: BP 134/90 mmHg  Pulse 82  Temp(Src) 97.9 F (36.6 C)  Ht 5' 11" (1.803 m)  Wt 207 lb 6.4 oz (94.076 kg)  BMI 28.94 kg/m2 General:   Alert and oriented. No distress noted. Pleasant and cooperative.  Head:  Normocephalic and atraumatic. Eyes:  Conjuctiva clear without scleral icterus. Mouth:  Oral mucosa pink and moist. Good dentition. No lesions. Heart:  S1, S2 present without murmurs, rubs, or gallops. Regular rate and rhythm. Abdomen:  +BS, soft, non-tender and non-distended. No rebound or guarding. No HSM or masses noted. Msk:  Symmetrical without gross deformities. Normal posture. Extremities:  Without edema. Neurologic:  Alert and  oriented x4;  grossly normal neurologically. Skin:  Intact without significant lesions or rashes. Psych:  Alert and cooperative. Normal mood and affect.  

## 2015-03-29 NOTE — Patient Instructions (Addendum)
Stop Bentyl. I sent in Levsin to your pharmacy to take every 4 hours as needed for cramping and loose stool.   You may use Miralax as needed for constipation.   Continue the probiotic daily.   Please review the gas/bloating handout.   We have scheduled you for an upper endoscopy with possible dilation in the near future.   Please complete the blood work. We will call with the results!

## 2015-03-30 LAB — IGA: IGA: 280 mg/dL (ref 69–380)

## 2015-03-30 LAB — TISSUE TRANSGLUTAMINASE, IGA: Tissue Transglutaminase Ab, IgA: 1 U/mL (ref <4)

## 2015-04-04 NOTE — Progress Notes (Signed)
Quick Note:  Negative celiac serologies. ______ 

## 2015-04-05 ENCOUNTER — Telehealth: Payer: Self-pay | Admitting: Gastroenterology

## 2015-04-05 NOTE — Assessment & Plan Note (Signed)
With mixed pattern. Last colonoscopy in 2013 by Dr. Teena DunkBenson. Need to obtain records. Less than ideal results with Bentyl. Will trial Levsin. Dietary choices and behavior modification will help with overall symptoms, as she is able to specifically note certain triggers. Use Levsin for cramping, loose stool. Miralax prn constipation. Continue probiotic. Gas/bloating handout provided. Will check celiac serologies for completeness' sake, although I doubt this will be positive.

## 2015-04-05 NOTE — Telephone Encounter (Signed)
Can we get colonoscopy reports from Dr. Teena DunkBenson in 2013? Thanks!

## 2015-04-05 NOTE — Assessment & Plan Note (Addendum)
49 year old female with chronic GERD, presenting with breakthrough reflux despite Protonix daily, solid food dysphagia, and globus sensation. Query uncontrolled GERD, web, ring, stricture. No odynophagia noted. Last EGD in 2008 by Dr. Jena Gaussourk with small hiatal hernia otherwise normal. Will proceed with EGD and dilation in near future.   Proceed with upper endoscopy and dilation in the near future with Dr. Jena Gaussourk. The risks, benefits, and alternatives have been discussed in detail with patient. They have stated understanding and desire to proceed.  Continue Protonix once daily for now PHENERGAN 25 mg IV ON CALL due to polypharmacy

## 2015-04-06 ENCOUNTER — Encounter (HOSPITAL_COMMUNITY)
Admission: RE | Disposition: A | Discharge status: Home / Self Care | Payer: Self-pay | Source: Ambulatory Visit | Attending: Internal Medicine

## 2015-04-06 ENCOUNTER — Ambulatory Visit (HOSPITAL_COMMUNITY)
Admission: RE | Admit: 2015-04-06 | Discharge: 2015-04-06 | Disposition: A | Discharge status: Home / Self Care | Payer: Medicare Other | Source: Ambulatory Visit | Attending: Internal Medicine | Admitting: Internal Medicine

## 2015-04-06 ENCOUNTER — Encounter (HOSPITAL_COMMUNITY): Payer: Self-pay | Admitting: *Deleted

## 2015-04-06 DIAGNOSIS — Z90710 Acquired absence of both cervix and uterus: Secondary | ICD-10-CM | POA: Diagnosis not present

## 2015-04-06 DIAGNOSIS — I10 Essential (primary) hypertension: Secondary | ICD-10-CM | POA: Diagnosis not present

## 2015-04-06 DIAGNOSIS — Z7982 Long term (current) use of aspirin: Secondary | ICD-10-CM | POA: Insufficient documentation

## 2015-04-06 DIAGNOSIS — F4310 Post-traumatic stress disorder, unspecified: Secondary | ICD-10-CM | POA: Diagnosis not present

## 2015-04-06 DIAGNOSIS — F17210 Nicotine dependence, cigarettes, uncomplicated: Secondary | ICD-10-CM | POA: Insufficient documentation

## 2015-04-06 DIAGNOSIS — Z888 Allergy status to other drugs, medicaments and biological substances status: Secondary | ICD-10-CM | POA: Diagnosis not present

## 2015-04-06 DIAGNOSIS — R1310 Dysphagia, unspecified: Secondary | ICD-10-CM | POA: Diagnosis not present

## 2015-04-06 DIAGNOSIS — F329 Major depressive disorder, single episode, unspecified: Secondary | ICD-10-CM | POA: Diagnosis not present

## 2015-04-06 DIAGNOSIS — E119 Type 2 diabetes mellitus without complications: Secondary | ICD-10-CM | POA: Insufficient documentation

## 2015-04-06 DIAGNOSIS — R1314 Dysphagia, pharyngoesophageal phase: Secondary | ICD-10-CM | POA: Diagnosis not present

## 2015-04-06 DIAGNOSIS — K219 Gastro-esophageal reflux disease without esophagitis: Secondary | ICD-10-CM | POA: Insufficient documentation

## 2015-04-06 HISTORY — DX: Other complications of anesthesia, initial encounter: T88.59XA

## 2015-04-06 HISTORY — DX: Other specified postprocedural states: Z98.890

## 2015-04-06 HISTORY — DX: Adverse effect of unspecified anesthetic, initial encounter: T41.45XA

## 2015-04-06 HISTORY — PX: ESOPHAGOGASTRODUODENOSCOPY: SHX5428

## 2015-04-06 HISTORY — PX: ESOPHAGEAL DILATION: SHX303

## 2015-04-06 HISTORY — DX: Other specified postprocedural states: R11.2

## 2015-04-06 LAB — GLUCOSE, CAPILLARY: Glucose-Capillary: 105 mg/dL — ABNORMAL HIGH (ref 65–99)

## 2015-04-06 SURGERY — DILATION, ESOPHAGUS

## 2015-04-06 MED ORDER — ONDANSETRON HCL 4 MG/2ML IJ SOLN
INTRAMUSCULAR | Status: DC | PRN
Start: 2015-04-06 — End: 2015-04-06
  Administered 2015-04-06: 4 mg via INTRAVENOUS

## 2015-04-06 MED ORDER — LIDOCAINE VISCOUS 2 % MT SOLN
OROMUCOSAL | Status: DC | PRN
  Administered 2015-04-06: 3 mL via OROMUCOSAL

## 2015-04-06 MED ORDER — PROMETHAZINE HCL 25 MG/ML IJ SOLN
INTRAMUSCULAR | Status: AC
  Filled 2015-04-06: qty 1

## 2015-04-06 MED ORDER — MEPERIDINE HCL 100 MG/ML IJ SOLN
INTRAMUSCULAR | Status: DC | PRN
  Administered 2015-04-06 (×2): 25 mg via INTRAVENOUS
  Administered 2015-04-06: 50 mg via INTRAVENOUS

## 2015-04-06 MED ORDER — ONDANSETRON HCL 4 MG/2ML IJ SOLN
INTRAMUSCULAR | Status: AC
  Filled 2015-04-06: qty 2

## 2015-04-06 MED ORDER — MIDAZOLAM HCL 5 MG/5ML IJ SOLN
INTRAMUSCULAR | Status: DC | PRN
  Administered 2015-04-06: 2 mg via INTRAVENOUS
  Administered 2015-04-06 (×2): 1 mg via INTRAVENOUS

## 2015-04-06 MED ORDER — LIDOCAINE VISCOUS 2 % MT SOLN
OROMUCOSAL | Status: AC
  Filled 2015-04-06: qty 15

## 2015-04-06 MED ORDER — PROMETHAZINE HCL 25 MG/ML IJ SOLN
25.0000 mg | Freq: Once | INTRAMUSCULAR | Status: AC
  Administered 2015-04-06: 25 mg via INTRAVENOUS

## 2015-04-06 MED ORDER — MEPERIDINE HCL 100 MG/ML IJ SOLN
INTRAMUSCULAR | Status: AC
  Filled 2015-04-06: qty 2

## 2015-04-06 MED ORDER — SODIUM CHLORIDE 0.9 % IV SOLN
INTRAVENOUS | Status: DC
  Administered 2015-04-06: 15:00:00 via INTRAVENOUS

## 2015-04-06 MED ORDER — MIDAZOLAM HCL 5 MG/5ML IJ SOLN
INTRAMUSCULAR | Status: AC
  Filled 2015-04-06: qty 10

## 2015-04-06 MED ORDER — STERILE WATER FOR IRRIGATION IR SOLN
Status: DC | PRN
  Administered 2015-04-06: 15:00:00

## 2015-04-06 NOTE — Op Note (Signed)
[facility] [address] [place], [address]   ENDOSCOPY PROCEDURE REPORT  PATIENT: Cynthia LarkBrim, Emmajo A  MR#: #[id number] BIRTHDATE: 04-Nov-1966 , 49  yrs. old GENDER: female ENDOSCOPIST: R.  Roetta SessionsMichael Jafeth Mustin, MD FACP FACG REFERRED BY:  Kirstie PeriAshish Shah, M.D. PROCEDURE DATE:  04/06/2015 PROCEDURE:  EGD, diagnostic and Maloney dilation of esophagus INDICATIONS:  GERD; esophageal dysphagia. MEDICATIONS: Versed 4 mg IV and Demerol 100 mg IV in divided doses. Phenergan 25 mg IV.  Zofran 4 mg IV.  Xylocaine gel orally ASA CLASS:      Class II  CONSENT: The risks, benefits, limitations, alternatives and imponderables have been discussed.  The potential for biopsy, esophogeal dilation, etc. have also been reviewed.  Questions have been answered.  All parties agreeable.  Please see the history and physical in the medical record for more information.  DESCRIPTION OF PROCEDURE: After the risks benefits and alternatives of the procedure were thoroughly explained, informed consent was obtained.  The [id number] ([id number] endoscope was introduced through the mouth and advanced to the second portion of the duodenum , limited by Without limitations. The instrument was slowly withdrawn as the mucosa was fully examined.    Normal-appearing, patent tubular esophagus.  Stomach empty except for minimal medication residue.  Normal-appearing gastric mucosa. Patent pylorus.  Normal-appearing first and second portion of the duodenum.  Retroflexed views revealed as previously described. The scope was withdrawn and a 54 [place] Maloney dilator was passed to full insertion easily. A look back revealed no apparent complication related to this maneuver.The scope was then withdrawn from the patient and the procedure completed.  COMPLICATIONS: There were no immediate complications.  ENDOSCOPIC IMPRESSION: Normal EGD?"status post Maloney dilation of the esophagus as described  above  RECOMMENDATIONS: Continue Protonix 40 mg daily. Office visit with us in 4-6 weeks  REPEAT EXAM:  eSigned:  R. Roetta SessionsMichael Desirey Keahey, MD Jerrel IvoryFACP [facility] HospitalFACG 04/06/2015 3:26 PM    CC:  CPT CODES: ICD CODES:  The ICD and CPT codes recommended by this software are interpretations from the data that the clinical staff has captured with the software.  The verification of the translation of this report to the ICD and CPT codes and modifiers is the sole responsibility of the health care institution and practicing physician where this report was generated.  [employer] will not be held responsible for the validity of the ICD and CPT codes included on this report.  AMA assumes no liability for data contained or not contained herein. CPT is a Publishing rights managerregistered trademark of the [employer].

## 2015-04-06 NOTE — H&P (View-Only) (Signed)
Referring Provider: Kirstie PeriShah, Ashish, MD Primary Care Physician:  Kirstie PeriSHAH,ASHISH, MD  Primary GI: Dr. Jena Gaussourk    Chief Complaint  Patient presents with  . Medication Refill  . IBS    HPI:   [name] is a 49 y.o. female presenting today with a history of IBS and GERD, last seen in 2014. Last Last colonoscopy in Dec 2013 by Dr. Teena DunkBenson. Upper endoscopy in 2008 with small hiatal hernia otherwise normal.   Has varying constipation and diarrhea. Will have abdominal discomfort, feels like has to go to bathroom, breaks out in a hot sweat, then will start throwing up and coming out the other end, too. Bentyl doesn't work well.  Takes [facility] [employer] [facility].    2% milk with horrible results. Uses almond milk and does well. Has gas all the time. Notes abdominal pain and bloating. No rectal bleeding. Protonix once a day. Intermittent reflux. Feels like gunk is stuck in esophagus. Globus sensation. Solid food dysphagia. Protonix once daily.   Past Medical History  Diagnosis Date  . Depression   . Diabetes mellitus   . LBP (low back pain)   . BMI (body mass index) 20.0-29.9 2008 192 lbs  . GERD (gastroesophageal reflux disease) 2002    202 lbs  . Irritable bowel syndrome 2008    diarrhea predominant MEDS TRIED: BENTYL, LEVBID  . RUQ abdominal pain 2004 HIDA NL CTA w/ IVC-FATTY LIVER, ANGIOMYOLIPOMA LEFT KIDNEY    2008 HIDA NL  . PTSD (post-traumatic stress disorder)   . Hypertension     Past Surgical History  Procedure Laterality Date  . Total abdominal hysterectomy  2000 fibroids/DUB  . Shoulder surgery  1997 RIGHT  . Breast reduction surgery  2006  . Lumbar disc surgery  2005  . Ankle surgery  RIGHT  . Colonoscopy  2008 RMR    PAN-COLONIC TICS, NL TI  . Upper gastrointestinal endoscopy  2008 RMR    SML HH o/w NL    Current Outpatient Prescriptions  Medication Sig Dispense Refill  . ALPRAZolam (XANAX) 0.5 MG tablet Take 1 tablet (0.5 mg total) by mouth 3 (three) times daily as  needed for sleep or anxiety. 90 tablet 2  . aspirin 81 MG tablet Take 81 mg by mouth every morning.     Marland Kitchen. atorvastatin (LIPITOR) 10 MG tablet Take 1 tablet by mouth daily.    Marland Kitchen. buPROPion (WELLBUTRIN XL) 150 MG 24 hr tablet Take 1 tablet (150 mg total) by mouth every morning. 30 tablet 0  . Cholecalciferol (VITAMIN D PO) Take 5,000 Units by mouth daily.    Marland Kitchen. dicyclomine (BENTYL) 10 MG capsule TAKE 1-2 CAPSULES 30 MINUTES PRIOR TO MEALS AND AT BEDTIME. (Patient not taking: Reported on 04/01/2015) 120 capsule 3  . DULoxetine (CYMBALTA) 60 MG capsule Take 1 capsule (60 mg total) by mouth daily. 30 capsule 0  . fexofenadine (ALLEGRA) 180 MG tablet Take 180 mg by mouth every morning.     . furosemide (LASIX) 20 MG tablet Take 20 mg by mouth 2 (two) times daily.      Marland Kitchen. lidocaine (LIDODERM) 5 % Place 1 patch onto the skin every 12 (twelve) hours as needed (for pain).     . Liraglutide (VICTOZA) 18 MG/3ML SOPN Inject 1.8 mg into the skin every evening.     Marland Kitchen. LYRICA 75 MG capsule Take 75 mg by mouth 2 (two) times daily.     . metFORMIN (GLUCOPHAGE-XR) 500 MG 24 hr tablet Take 500  mg by mouth daily.    . metoprolol (LOPRESSOR) 100 MG tablet Take 100 mg by mouth 2 (two) times daily.     . Multiple Vitamin (MULTIVITAMIN) capsule Take 1 capsule by mouth daily.      . ONE TOUCH ULTRA TEST test strip     . oxycodone (ROXICODONE) 30 MG immediate release tablet Take 30 mg by mouth every 6 (six) hours as needed. For pain    . pantoprazole (PROTONIX) 40 MG tablet TAKE (1) TABLET BY MOUTH ONCE DAILY. 30 tablet 2  . QC PEN NEEDLES 31G X 6 MM MISC     . topiramate (TOPAMAX) 50 MG tablet Take 1 tablet (50 mg total) by mouth 2 (two) times daily. (Patient taking differently: Take 50 mg by mouth 2 (two) times daily as needed (for headache pain). ) 60 tablet 12  . valsartan-hydrochlorothiazide (DIOVAN-HCT) 160-25 MG per tablet Take 1 tablet by mouth daily.    . VOLTAREN 1 % GEL Apply 2 g topically daily as needed (pain).      Marland Kitchen. zolpidem (AMBIEN) 10 MG tablet Take 10 mg by mouth at bedtime as needed for sleep.     . hyoscyamine (LEVSIN SL) 0.125 MG SL tablet Place 1 tablet (0.125 mg total) under the tongue every 4 (four) hours as needed. 120 tablet 11   No current facility-administered medications for this visit.    Allergies as of 03/29/2015 - Review Complete 03/29/2015  Allergen Reaction Noted  . Neurontin [gabapentin] Other (See Comments) 03/24/2013    Family History  Problem Relation Age of Onset  . Breast cancer Maternal Aunt   . Anxiety disorder Maternal Aunt   . Colon polyps Neg Hx   . Colon cancer Neg Hx   . ADD / ADHD Neg Hx   . Bipolar disorder Neg Hx   . Depression Neg Hx   . OCD Neg Hx   . Paranoid behavior Neg Hx   . Schizophrenia Neg Hx   . Sexual abuse Neg Hx   . Physical abuse Neg Hx   . Anxiety disorder Mother   . Stroke Mother   . COPD Mother   . Hypertension Mother   . Thyroid disease Mother   . Diabetes Mother   . Drug abuse Daughter   . Anxiety disorder Daughter   . Seizures Sister   . Alcohol abuse Maternal Uncle   . Mental illness Cousin   . Dementia Maternal Aunt   . Anxiety disorder Sister   . Anxiety disorder Sister   . Drug abuse Sister   . Early death Sister   . Heart disease Sister   . Heart attack Sister     History   Social History  . Marital Status: Single    Spouse Name: N/A  . Number of Children: 3  . Years of Education: 12th   Occupational History  .      [employer]   Social History Main Topics  . Smoking status: Current Every Day Smoker -- 0.50 packs/day for 2 years    Types: Cigarettes    Last Attempt to Quit: 09/12/2013  . Smokeless tobacco: Never Used     Comment: 10 cigs a day as of 03/24/2013  . Alcohol Use: No  . Drug Use: No  . Sexual Activity: Yes    Birth Control/ Protection: Surgical   Other Topics Concern  . None   Social History Narrative   Patient lives at home with her family.   Caffeine Use:  2 cups of coffee daily      Review of Systems: As mentioned in HPI  Physical Exam: BP 134/90 mmHg  Pulse 82  Temp(Src) 97.9 F (36.6 C)  Ht  (1.803 m)  Wt 207 lb 6.4 oz (94.076 kg)  BMI 28.94 kg/m2 General:   Alert and oriented. No distress noted. Pleasant and cooperative.  Head:  Normocephalic and atraumatic. Eyes:  Conjuctiva clear without scleral icterus. Mouth:  Oral mucosa pink and moist. Good dentition. No lesions. Heart:  S1, S2 present without murmurs, rubs, or gallops. Regular rate and rhythm. Abdomen:  +BS, soft, non-tender and non-distended. No rebound or guarding. No HSM or masses noted. Msk:  Symmetrical without gross deformities. Normal posture. Extremities:  Without edema. Neurologic:  Alert and  oriented x4;  grossly normal neurologically. Skin:  Intact without significant lesions or rashes. Psych:  Alert and cooperative. Normal mood and affect.

## 2015-04-06 NOTE — Discharge Instructions (Signed)
EGD Discharge instructions Please read the instructions outlined below and refer to this sheet in the next few weeks. These discharge instructions provide you with general information on caring for yourself after you leave the hospital. Your doctor may also give you specific instructions. While your treatment has been planned according to the most current medical practices available, unavoidable complications occasionally occur. If you have any problems or questions after discharge, please call your doctor. ACTIVITY  You may resume your regular activity but move at a slower pace for the next 24 hours.   Take frequent rest periods for the next 24 hours.   Walking will help expel (get rid of) the air and reduce the bloated feeling in your abdomen.   No driving for 24 hours (because of the anesthesia (medicine) used during the test).   You may shower.   Do not sign any important legal documents or operate any machinery for 24 hours (because of the anesthesia used during the test).  NUTRITION  Drink plenty of fluids.   You may resume your normal diet.   Begin with a light meal and progress to your normal diet.   Avoid alcoholic beverages for 24 hours or as instructed by your caregiver.  MEDICATIONS  You may resume your normal medications unless your caregiver tells you otherwise.  WHAT YOU CAN EXPECT TODAY  You may experience abdominal discomfort such as a feeling of fullness or gas pains.  FOLLOW-UP  Your doctor will discuss the results of your test with you.  SEEK IMMEDIATE MEDICAL ATTENTION IF ANY OF THE FOLLOWING OCCUR:  Excessive nausea (feeling sick to your stomach) and/or vomiting.   Severe abdominal pain and distention (swelling).   Trouble swallowing.   Temperature over 101 F (37.8 C).   Rectal bleeding or vomiting of blood.      Continue Protonix 40 mg daily   Office visit with us in 4-6 weeks

## 2015-04-06 NOTE — Telephone Encounter (Signed)
Requested records.

## 2015-04-06 NOTE — Interval H&P Note (Signed)
History and Physical Interval Note:  04/06/2015 3:00 PM  [name]  has presented today for surgery, with the diagnosis of dysphagia  The various methods of treatment have been discussed with the patient and family. After consideration of risks, benefits and other options for treatment, the patient has consented to  Procedure(s) with comments: ESOPHAGEAL DILATION (N/A) - 300 ESOPHAGOGASTRODUODENOSCOPY (EGD) (N/A) as a surgical intervention .  The patient's history has been reviewed, patient examined, no change in status, stable for surgery.  I have reviewed the patient's chart and labs.  Questions were answered to the patient's satisfaction.     Triston Lisanti   No change. EGD with possible esophageal dilation as appropriate. The risks, benefits, limitations, alternatives and imponderables have been reviewed with the patient. Potential for esophageal dilation, biopsy, etc. have also been reviewed.  Questions have been answered. All parties agreeable.

## 2015-04-07 NOTE — Progress Notes (Signed)
cc'ed to pcp °

## 2015-04-08 ENCOUNTER — Encounter (HOSPITAL_COMMUNITY): Payer: Self-pay | Admitting: Internal Medicine

## 2015-04-12 ENCOUNTER — Ambulatory Visit
Admission: RE | Admit: 2015-04-12 | Discharge: 2015-04-12 | Disposition: A | Discharge status: Home / Self Care | Payer: Medicare Other | Source: Ambulatory Visit | Attending: Neurosurgery | Admitting: Neurosurgery

## 2015-04-12 DIAGNOSIS — M545 Low back pain: Secondary | ICD-10-CM

## 2015-04-12 DIAGNOSIS — M542 Cervicalgia: Secondary | ICD-10-CM

## 2015-04-14 ENCOUNTER — Ambulatory Visit (INDEPENDENT_AMBULATORY_CARE_PROVIDER_SITE_OTHER): Payer: Medicare Other | Admitting: Psychiatry

## 2015-04-14 ENCOUNTER — Encounter (HOSPITAL_COMMUNITY): Payer: Self-pay | Admitting: Psychiatry

## 2015-04-14 VITALS — BP 138/96 | HR 74 | Ht 71.0 in | Wt 207.0 lb

## 2015-04-14 DIAGNOSIS — F322 Major depressive disorder, single episode, severe without psychotic features: Secondary | ICD-10-CM

## 2015-04-14 DIAGNOSIS — F332 Major depressive disorder, recurrent severe without psychotic features: Secondary | ICD-10-CM

## 2015-04-14 MED ORDER — ALPRAZOLAM 0.5 MG PO TABS: 0.5000 mg | ORAL_TABLET | Freq: Three times a day (TID) | ORAL | Status: DC | PRN

## 2015-04-14 MED ORDER — ZOLPIDEM TARTRATE 10 MG PO TABS: 10.0000 mg | ORAL_TABLET | Freq: Every evening | ORAL | Status: DC | PRN

## 2015-04-14 MED ORDER — BUPROPION HCL ER (XL) 150 MG PO TB24: 300.0000 mg | ORAL_TABLET | ORAL | Status: DC

## 2015-04-14 MED ORDER — DULOXETINE HCL 60 MG PO CPEP: 60.0000 mg | ORAL_CAPSULE | Freq: Every day | ORAL | Status: DC
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
## 2015-04-22 ENCOUNTER — Ambulatory Visit (HOSPITAL_COMMUNITY): Payer: Self-pay | Admitting: Psychology

## 2015-05-10 ENCOUNTER — Ambulatory Visit (HOSPITAL_COMMUNITY): Payer: Self-pay | Admitting: Psychology

## 2015-05-13 ENCOUNTER — Ambulatory Visit (INDEPENDENT_AMBULATORY_CARE_PROVIDER_SITE_OTHER): Payer: Medicare Other | Admitting: Cardiology

## 2015-05-13 ENCOUNTER — Encounter: Payer: Self-pay | Admitting: Cardiology

## 2015-05-13 VITALS — BP 132/84 | HR 68 | Ht 71.0 in | Wt 210.8 lb

## 2015-05-13 DIAGNOSIS — R002 Palpitations: Secondary | ICD-10-CM

## 2015-05-13 DIAGNOSIS — R011 Cardiac murmur, unspecified: Secondary | ICD-10-CM | POA: Diagnosis not present

## 2015-05-13 DIAGNOSIS — R0789 Other chest pain: Secondary | ICD-10-CM

## 2015-05-13 NOTE — Progress Notes (Signed)
Clinical Summary [name] is a 49 y.o.female seen today as a new patient for the following medical problems.  1. Atypical chest pain - 12/2010 Nuclear stress test without ischemia - she also some years ago had a normal CT angiogram of her coronaries  - current symptoms different from previous pain. Aching pain/pressure pain mid chest to left chest. Can occur at rest or with exertion. Most often with laying down. Notes skipped beats at times. Can be worst with deep breaths. Can feel mild SOB. Pains a few minutes. Occurs 2 times a day. No relation to eating - notes some DOE at 1 block.   CAD risk factors: DM2, HL, HTN, former tobacco short period of time   2. Palpitations - previous monitor several years ago showed no significant arrhythmias, only occasional PVCs - ongoing palpitations  3. Leg edema - started 2 weeks ago - she is on lasix  bid for some time   Past Medical History  Diagnosis Date  . Depression   . Diabetes mellitus   . LBP (low back pain)   . BMI (body mass index) 20.0-29.9 2008 192 lbs  . GERD (gastroesophageal reflux disease) 2002    202 lbs  . Irritable bowel syndrome 2008    diarrhea predominant MEDS TRIED: BENTYL, LEVBID  . RUQ abdominal pain 2004 HIDA NL CTA w/ IVC-FATTY LIVER, ANGIOMYOLIPOMA LEFT KIDNEY    2008 HIDA NL  . PTSD (post-traumatic stress disorder)   . Hypertension   . Complication of anesthesia     post op N/V  . PONV (postoperative nausea and vomiting)      Allergies  Allergen Reactions  . Neurontin [Gabapentin] Other (See Comments)    Out of it and could not function in this while on all the other bp meds.     Current Outpatient Prescriptions  Medication Sig Dispense Refill  . ALPRAZolam (XANAX) 0.5 MG tablet Take 1 tablet (0.5 mg total) by mouth 3 (three) times daily as needed for sleep or anxiety. 90 tablet 2  . aspirin 81 MG tablet Take 81 mg by mouth every morning.     Marland Kitchen atorvastatin (LIPITOR) 10 MG tablet Take 1  tablet by mouth daily.    Marland Kitchen buPROPion (WELLBUTRIN XL) 150 MG 24 hr tablet Take 2 tablets (300 mg total) by mouth every morning. 60 tablet 2  . Cholecalciferol (VITAMIN D PO) Take 5,000 Units by mouth daily.    Marland Kitchen dicyclomine (BENTYL) 10 MG capsule TAKE 1-2 CAPSULES 30 MINUTES PRIOR TO MEALS AND AT BEDTIME. 120 capsule 3  . DULoxetine (CYMBALTA) 60 MG capsule Take 1 capsule (60 mg total) by mouth daily. 30 capsule 2  . fexofenadine (ALLEGRA) 180 MG tablet Take 180 mg by mouth every morning.     . furosemide (LASIX) 20 MG tablet Take 20 mg by mouth 2 (two) times daily.      Marland Kitchen lidocaine (LIDODERM) 5 % Place 1 patch onto the skin every 12 (twelve) hours as needed (for pain).     . Liraglutide (VICTOZA) 18 MG/3ML SOPN Inject 1.8 mg into the skin every evening.     Marland Kitchen LYRICA 75 MG capsule Take 75 mg by mouth 2 (two) times daily.     . metFORMIN (GLUCOPHAGE-XR) 500 MG 24 hr tablet Take 500 mg by mouth daily.    . metoprolol (LOPRESSOR) 100 MG tablet Take 100 mg by mouth 2 (two) times daily.     . Multiple Vitamin (MULTIVITAMIN) capsule Take 1  capsule by mouth daily.      . ONE TOUCH ULTRA TEST test strip     . oxycodone (ROXICODONE) 30 MG immediate release tablet Take 30 mg by mouth every 6 (six) hours as needed. For pain    . pantoprazole (PROTONIX) 40 MG tablet TAKE (1) TABLET BY MOUTH ONCE DAILY. 30 tablet 2  . QC PEN NEEDLES 31G X 6 MM MISC     . topiramate (TOPAMAX) 50 MG tablet Take 1 tablet (50 mg total) by mouth 2 (two) times daily. (Patient taking differently: Take 50 mg by mouth 2 (two) times daily as needed (for headache pain). ) 60 tablet 12  . valsartan-hydrochlorothiazide (DIOVAN-HCT) 160-25 MG per tablet Take 1 tablet by mouth daily.    . VOLTAREN 1 % GEL Apply 2 g topically daily as needed (pain).     Marland Kitchen zolpidem (AMBIEN) 10 MG tablet Take 1 tablet (10 mg total) by mouth at bedtime as needed for sleep. 30 tablet 2   No current facility-administered medications for this visit.     Past  Surgical History  Procedure Laterality Date  . Total abdominal hysterectomy  2000 fibroids/DUB  . Shoulder surgery  1997 RIGHT  . Breast reduction surgery  2006  . Lumbar disc surgery  2005  . Ankle surgery  RIGHT  . Colonoscopy  2008 RMR    PAN-COLONIC TICS, NL TI  . Upper gastrointestinal endoscopy  2008 RMR    SML HH o/w NL  . Esophageal dilation N/A 04/06/2015    Procedure: ESOPHAGEAL DILATION;  Surgeon: Corbin Ade, MD;  Location: [facility];  Service: Gastroenterology;  Laterality: N/A;  300  . Esophagogastroduodenoscopy N/A 04/06/2015    SLF: Normal EGD status post Maloney dilation of teh esophagus as described above     Allergies  Allergen Reactions  . Neurontin [Gabapentin] Other (See Comments)    Out of it and could not function in this while on all the other bp meds.      Family History  Problem Relation Age of Onset  . Breast cancer Maternal Aunt   . Anxiety disorder Maternal Aunt   . Colon polyps Neg Hx   . Colon cancer Neg Hx   . ADD / ADHD Neg Hx   . Bipolar disorder Neg Hx   . Depression Neg Hx   . OCD Neg Hx   . Paranoid behavior Neg Hx   . Schizophrenia Neg Hx   . Sexual abuse Neg Hx   . Physical abuse Neg Hx   . Anxiety disorder Mother   . Stroke Mother   . COPD Mother   . Hypertension Mother   . Thyroid disease Mother   . Diabetes Mother   . Drug abuse Daughter   . Anxiety disorder Daughter   . Seizures Sister   . Alcohol abuse Maternal Uncle   . Mental illness Cousin   . Dementia Maternal Aunt   . Anxiety disorder Sister   . Anxiety disorder Sister   . Drug abuse Sister   . Early death Sister   . Heart disease Sister   . Heart attack Sister      Social History Ms. Mclennan reports that she has been smoking Cigarettes.  She has a 1 pack-year smoking history. She has never used smokeless tobacco. Ms. Eppard reports that she does not drink alcohol.   Review of Systems CONSTITUTIONAL: No weight loss, fever, chills, weakness or fatigue.   HEENT: Eyes: No visual loss, blurred vision,  double vision or yellow sclerae.No hearing loss, sneezing, congestion, runny nose or sore throat.  SKIN: No rash or itching.  CARDIOVASCULAR: per HPI RESPIRATORY: No shortness of breath, cough or sputum.  GASTROINTESTINAL: No anorexia, nausea, vomiting or diarrhea. No abdominal pain or blood.  GENITOURINARY: No burning on urination, no polyuria NEUROLOGICAL: No headache, dizziness, syncope, paralysis, ataxia, numbness or tingling in the extremities. No change in bowel or bladder control.  MUSCULOSKELETAL: No muscle, back pain, joint pain or stiffness.  LYMPHATICS: No enlarged nodes. No history of splenectomy.  PSYCHIATRIC: No history of depression or anxiety.  ENDOCRINOLOGIC: No reports of sweating, cold or heat intolerance. No polyuria or polydipsia.  Marland Kitchen   Physical Examination Filed Vitals:   05/13/15 0951  BP: 132/84  Pulse: 68   Filed Vitals:   05/13/15 0951  Height: 5\' 11"  (1.803 m)  Weight: 210 lb 12.8 oz (95.618 kg)    Gen: resting comfortably, no acute distress HEENT: no scleral icterus, pupils equal round and reactive, no palptable cervical adenopathy,  CV: RRR, 2/6 systolic murmur RUSB, no JVD Resp: Clear to auscultation bilaterally GI: abdomen is soft, non-tender, non-distended, normal bowel sounds, no hepatosplenomegaly MSK: extremities are warm, no edema.  Skin: warm, no rash Neuro:  no focal deficits Psych: appropriate affect     Assessment and Plan  1. Chest pain - multiple cad risk factors - obtain echo, pending results likely obtain Lexiscan. She cannot run on treadmill due to back pain  2. Palpitations - long history of symptoms, prior monitor with just occasional PVCs - check BMET, Mg, TSH - continue beta blocker   3. Heart murmur - daughter with HOCM - will obtain echo for murmur and family screening  F/u 1 month. Pending echo results may need lexiscan  Antoine Poche, M.D

## 2015-05-13 NOTE — Patient Instructions (Signed)
Your physician recommends that you schedule a follow-up appointment in:  1 MONTH WITH DR. BRANCH  Your physician recommends that you continue on your current medications as directed. Please refer to the Current Medication list given to you today.  Your physician has requested that you have an echocardiogram. Echocardiography is a painless test that uses sound waves to create images of your heart. It provides your doctor with information about the size and shape of your heart and how well your heart's chambers and valves are working. This procedure takes approximately one hour. There are no restrictions for this procedure.   Your physician recommends that you return for lab work BEFORE YOUR NEXT VISIT BMP/MAG/TSH  Thank you for choosing [facility]!!

## 2015-05-18 ENCOUNTER — Encounter: Payer: Self-pay | Admitting: Gastroenterology

## 2015-05-18 ENCOUNTER — Ambulatory Visit (INDEPENDENT_AMBULATORY_CARE_PROVIDER_SITE_OTHER): Payer: Medicare Other | Admitting: Gastroenterology

## 2015-05-18 VITALS — BP 115/82 | HR 84 | Temp 97.6°F | Ht 71.0 in | Wt 210.4 lb

## 2015-05-18 DIAGNOSIS — F458 Other somatoform disorders: Secondary | ICD-10-CM | POA: Diagnosis not present

## 2015-05-18 DIAGNOSIS — K219 Gastro-esophageal reflux disease without esophagitis: Secondary | ICD-10-CM

## 2015-05-18 DIAGNOSIS — R0989 Other specified symptoms and signs involving the circulatory and respiratory systems: Secondary | ICD-10-CM

## 2015-05-18 MED ORDER — DEXLANSOPRAZOLE 60 MG PO CPDR: 60.0000 mg | DELAYED_RELEASE_CAPSULE | Freq: Every day | ORAL | Status: DC

## 2015-05-18 NOTE — Patient Instructions (Signed)
1. Stop pantoprazole. Try Dexilant 60mg  daily 30 minutes before breakfast. Samples provided but RX sent as well. I could not tell if this is covered by your insurance today. If it not, please have pharmacy let us know.  2. Call in 3-4 weeks with a progress report.  3. Discuss with your family doctor the possibility of sinuses or enlarged thyroid contributing to your symptoms.

## 2015-05-18 NOTE — Assessment & Plan Note (Signed)
Several year history of globus sensation. Recent EGD unremarkable. Trial of different PPI. If ongoing symptoms, could offer a barium esophagram to exclude possibility of Zenker's which may have not been noted at time of endoscopy. If persisting symptoms would also advise follow-up with ENT, Dr. Andrey CampanileWilson.   Patient has an office visit with her family doctor today. Plans to discuss sinusitis and follow-up on history of thyroid goiter.  She will call in a few weeks and let me know how she's doing. Otherwise we will see her back in one year for follow-up of GERD and IBS.

## 2015-05-18 NOTE — Progress Notes (Signed)
Primary Care Physician: Kirstie PeriSHAH,ASHISH, MD  Primary Gastroenterologist:  Roetta SessionsMichael Rourk, MD   Chief Complaint  Patient presents with  . Follow-up    HPI: [name] is a 49 y.o. female here for follow-up of recent EGD.  EGD last month for GERD, globus, dysphagia. Normal-appearing esophagus. 54 French Maloney dilator was passed without any apparent complication. No other findings.  Patient also has a history of IBS. Last colonoscopy December 2013 by Dr. Teena DunkBenson. Recent celiac serologies unremarkable.  Patient has been having globus symptoms for years. States she previously was seen by Dr. Andrey CampanileWilson, ENT and found to have vocal cord polyps which required removing. This was approximately around 2013. Also with history of allergies and sinusitis. States she has a history of goiter but hasn't followed up with endocrinology in almost 10 years. History of neck disc disease requiring injections.  Describes having sensation of a lump in her throat when swallowing saliva and food. On pantoprazole 40 mg daily. No heartburn. Has previously tried omeprazole as well. Did not help globus sensation. Denies food impaction. Denies vomiting, abdominal pain. IBS stable as long she avoids dairy products. Still on Bentyl, Levsin not paid for by insurance.  Current Outpatient Prescriptions  Medication Sig Dispense Refill  . ALPRAZolam (XANAX) 0.5 MG tablet Take 1 tablet (0.5 mg total) by mouth 3 (three) times daily as needed for sleep or anxiety. 90 tablet 2  . aspirin 81 MG tablet Take 81 mg by mouth every morning.     Marland Kitchen. atorvastatin (LIPITOR) 10 MG tablet Take 1 tablet by mouth daily.    Marland Kitchen. buPROPion (WELLBUTRIN XL) 150 MG 24 hr tablet Take 2 tablets (300 mg total) by mouth every morning. 60 tablet 2  . Cholecalciferol (VITAMIN D PO) Take 5,000 Units by mouth daily.    Marland Kitchen. dicyclomine (BENTYL) 10 MG capsule TAKE 1-2 CAPSULES 30 MINUTES PRIOR TO MEALS AND AT BEDTIME. 120 capsule 3  . DULoxetine (CYMBALTA) 60  MG capsule Take 1 capsule (60 mg total) by mouth daily. 30 capsule 2  . fexofenadine (ALLEGRA) 180 MG tablet Take 180 mg by mouth every morning.     . furosemide (LASIX) 20 MG tablet Take 20 mg by mouth 2 (two) times daily.      Marland Kitchen. ibuprofen (ADVIL,MOTRIN) 800 MG tablet Take 800 mg by mouth as needed for headache.    . lidocaine (LIDODERM) 5 % Place 1 patch onto the skin every 12 (twelve) hours as needed (for pain).     . Liraglutide (VICTOZA) 18 MG/3ML SOPN Inject 1.8 mg into the skin every evening.     Marland Kitchen. LYRICA 75 MG capsule Take 75 mg by mouth 2 (two) times daily.     . metFORMIN (GLUCOPHAGE-XR) 500 MG 24 hr tablet Take 500 mg by mouth daily.    . metoprolol (LOPRESSOR) 100 MG tablet Take 100 mg by mouth 2 (two) times daily.     . Multiple Vitamin (MULTIVITAMIN) capsule Take 1 capsule by mouth daily.      . ONE TOUCH ULTRA TEST test strip     . oxycodone (ROXICODONE) 30 MG immediate release tablet Take 30 mg by mouth every 6 (six) hours as needed. For pain    . pantoprazole (PROTONIX) 40 MG tablet TAKE (1) TABLET BY MOUTH ONCE DAILY. 30 tablet 2  . QC PEN NEEDLES 31G X 6 MM MISC     . valsartan-hydrochlorothiazide (DIOVAN-HCT) 160-25 MG per tablet Take 1 tablet by mouth daily.    .Marland Kitchen  VOLTAREN 1 % GEL Apply 2 g topically daily as needed (pain).     Marland Kitchen zolpidem (AMBIEN) 10 MG tablet Take 1 tablet (10 mg total) by mouth at bedtime as needed for sleep. 30 tablet 2   No current facility-administered medications for this visit.    Allergies as of 05/18/2015 - Review Complete 05/18/2015  Allergen Reaction Noted  . Neurontin [gabapentin] Other (See Comments) 03/24/2013    ROS:  General: Negative for anorexia, weight loss, fever, chills, fatigue, weakness. ENT: Negative for hoarseness, difficulty swallowing , nasal congestion. CV: Negative for chest pain, angina, palpitations, dyspnea on exertion, peripheral edema.  Respiratory: Negative for dyspnea at rest, dyspnea on exertion, cough, sputum,  wheezing.  GI: See history of present illness. GU:  Negative for dysuria, hematuria, urinary incontinence, urinary frequency, nocturnal urination.  Endo: Negative for unusual weight change.    Physical Examination:   BP 115/82 mmHg  Pulse 84  Temp(Src) 97.6 F (36.4 C) (Oral)  Ht  (1.803 m)  Wt 210 lb 6.4 oz (95.437 kg)  BMI 29.36 kg/m2  General: Well-nourished, well-developed in no acute distress.  Eyes: No icterus. Mouth: Oropharyngeal mucosa moist and pink , no lesions erythema or exudate. Extremities: No lower extremity edema. No clubbing or deformities. Neuro: Alert and oriented x 4   Skin: Warm and dry, no jaundice.   Psych: Alert and cooperative, normal mood and affect.    Imaging Studies: No results found.

## 2015-05-18 NOTE — Progress Notes (Signed)
cc'd to pcp 

## 2015-05-31 ENCOUNTER — Ambulatory Visit (HOSPITAL_COMMUNITY): Payer: Self-pay | Admitting: Psychiatry

## 2015-06-02 ENCOUNTER — Other Ambulatory Visit: Payer: Self-pay

## 2015-06-02 ENCOUNTER — Ambulatory Visit (INDEPENDENT_AMBULATORY_CARE_PROVIDER_SITE_OTHER): Payer: Medicare Other

## 2015-06-02 DIAGNOSIS — R002 Palpitations: Secondary | ICD-10-CM

## 2015-06-02 DIAGNOSIS — R0789 Other chest pain: Secondary | ICD-10-CM | POA: Diagnosis not present

## 2015-06-06 ENCOUNTER — Telehealth: Payer: Self-pay

## 2015-06-06 DIAGNOSIS — R072 Precordial pain: Secondary | ICD-10-CM

## 2015-06-06 NOTE — Telephone Encounter (Signed)
-----   Message from Antoine PocheJonathan F Branch, MD sent at 06/06/2015 12:40 PM EDT ----- Patient's echo looks good, her heart function is normal. There is no evidence of the heart disease that runs in her family (hypertrophic cardiomyopathy). Please order lexiscan for chest pain, does not need to hold any meds  Cynthia FerryJ Branch MD

## 2015-06-06 NOTE — Telephone Encounter (Signed)
Pt will schedule lexiscan today

## 2015-06-07 ENCOUNTER — Ambulatory Visit (HOSPITAL_COMMUNITY): Payer: Self-pay | Admitting: Psychology

## 2015-06-10 NOTE — Progress Notes (Signed)
Patient:  [name]   DOB: 1966/05/10  MR Number: [id number]  Location: [facility] [address] [place] [address] Dept: [phone]  Start: 2 PM End: 3 PM  Provider/Observer:     Hershal Coria PSYD  Chief Complaint:      Chief Complaint  Patient presents with  . Anxiety  . Depression  . Stress    Reason For Service:     The patient was referred because of increasing symptoms of depression. Relationship issues, financial problems, medical issues, and loss of her brother in 2002 were all problematic situations for her to deal with. Most recently, major conflicts between her, her knees, and her daughter have also been quite disruptive. At this point, the patient has continued to struggle with severe back issues along with severe depression.  Interventions Strategy:  Cognitive/behavioral psychotherapy  Participation Level:   Active  Participation Quality:  Appropriate      Behavioral Observation:  Well Groomed, Alert, and Appropriate.   Current Psychosocial Factors: The patient reports that her daughter is continued to be treated in the hospital and is in preparation for heart surgery. The patient's daughter developed a severe infection in her heart that has progressively destroyed one of her heart valves. This is likely due to intravenous drug use causing the infection. And while they are going to replace the valve with an artificial valve there are other issues that were discovered. Her daughter has hardening and thickening of the heart wall which is likely a genetic condition as several other members of the family have had this condition. The patient herself has been referred back to the patient's cardiologist to see if these issues are happening with her Medi-Cal for some of the cardiovascular issues she has had over the recent years. The patient reports that she is very concerned  about her daughter even though she tries not to be as the daughter's life is been in danger for quite some time regarding severe substance abuse. However, the patient reports that she continues to be quite worried about what is going to happen to her daughter. The daughter is claiming that she is completely stopped any drug use. Another major issue that is happening recently is the fact that the patient's sister was arrested for sale and delivery and trafficking of controlled substance. The patient is known that her sister was engaged in these types of activities for quite some time but was unaware of the extent. The patient is not able to help out with her sister's children at all issues are taking care of another sister's children who died years ago and raising them as well as taking care of her daughter's children.    Content of Session:   Reviewed current symptoms and continued work on therapeutic interventions  Current Status:   The patient continues to have orthopedic issues causing back pain and leg pain. She also has been showing signs of cardiovascular issues. During her last visit I insisted that the patient go to see either her cardiologist or the emergency department. Her cardiologist was not available so she was seen at the emergency department and now has a follow-up appointment with her cardiologist. Information is happening since my insistence on her being checked out and that is of discovery's of genetic conditions that are daughter is dealing with regarding thickening and hardening of the heart wall.  Patient Progress:   Stable  Target Goals:  Target goals include reducing symptoms of depression, anxiety, and increase coping skills.  Last Reviewed:   03/24/2015  Goals Addressed Today:    Today we worked on adjusted issues with the dissolution of her relationship with "PJ"  Impression/Diagnosis:   The patient has had significant psychosocial stressors throughout her life going on  going back to childhood. Most recently she has been taking care of her g as well as her sister's children. Her sister died several years ago and she has raised her sister's children. She also adopted one of her sister's children her. The patient has her own daughter who has that was serious and significant substance abuse. The patient has recurrent major depressive events.randmother who had a severe stroke  Diagnosis:    Axis I:  Major depressive disorder, recurrent, severe without psychotic features  Pain syndrome, chronic      Axis II: No diagnosis

## 2015-06-13 ENCOUNTER — Encounter (HOSPITAL_COMMUNITY): Payer: Self-pay

## 2015-06-13 ENCOUNTER — Encounter (HOSPITAL_COMMUNITY)
Admission: RE | Admit: 2015-06-13 | Discharge: 2015-06-13 | Disposition: A | Discharge status: Home / Self Care | Payer: Medicare Other | Source: Ambulatory Visit | Attending: Cardiology | Admitting: Cardiology

## 2015-06-13 ENCOUNTER — Inpatient Hospital Stay (HOSPITAL_COMMUNITY): Admission: RE | Admit: 2015-06-13 | Payer: Self-pay | Source: Ambulatory Visit

## 2015-06-13 ENCOUNTER — Other Ambulatory Visit (HOSPITAL_COMMUNITY): Payer: Self-pay | Admitting: Psychiatry

## 2015-06-13 DIAGNOSIS — R072 Precordial pain: Secondary | ICD-10-CM

## 2015-06-13 LAB — NM MYOCAR MULTI W/SPECT W/WALL MOTION / EF
CHL CUP NUCLEAR SDS: 5
CHL CUP RESTING HR STRESS: 68 {beats}/min
CSEPPHR: 102 {beats}/min
LV dias vol: 68 mL
LV sys vol: 27 mL
RATE: 0.45
SRS: 1
SSS: 6
TID: 1.16

## 2015-06-13 MED ORDER — TECHNETIUM TC 99M SESTAMIBI - CARDIOLITE
30.0000 | Freq: Once | INTRAVENOUS | Status: AC | PRN
  Administered 2015-06-13: 10:00:00 30 via INTRAVENOUS

## 2015-06-13 MED ORDER — SODIUM CHLORIDE 0.9 % IJ SOLN
INTRAMUSCULAR | Status: AC
  Administered 2015-06-13: 10 mL via INTRAVENOUS
  Filled 2015-06-13: qty 3

## 2015-06-13 MED ORDER — TECHNETIUM TC 99M SESTAMIBI GENERIC - CARDIOLITE
10.0000 | Freq: Once | INTRAVENOUS | Status: AC | PRN
  Administered 2015-06-13: 10 via INTRAVENOUS

## 2015-06-13 MED ORDER — REGADENOSON 0.4 MG/5ML IV SOLN
INTRAVENOUS | Status: AC
  Administered 2015-06-13: 0.4 mg via INTRAVENOUS
  Filled 2015-06-13: qty 5

## 2015-06-14 ENCOUNTER — Ambulatory Visit (HOSPITAL_COMMUNITY): Payer: Self-pay | Admitting: Psychiatry

## 2015-06-17 ENCOUNTER — Encounter: Payer: Self-pay | Admitting: Cardiology

## 2015-06-17 NOTE — Progress Notes (Signed)
Patient ID: [name], female   DOB: [birth year], 49 y.o.   MRN: [id number]    ERROR

## 2015-06-23 ENCOUNTER — Encounter: Payer: Self-pay | Admitting: Gastroenterology

## 2015-06-23 NOTE — Progress Notes (Signed)
Colonoscopy in 2013 by Dr. Teena Dunk: diverticulum in ascending colon, mild diverticulosis in sigmoid. Recommended 5 year surveillance

## 2015-07-06 ENCOUNTER — Ambulatory Visit (HOSPITAL_COMMUNITY): Payer: Self-pay | Admitting: Psychiatry

## 2015-07-07 ENCOUNTER — Ambulatory Visit (HOSPITAL_COMMUNITY): Payer: Self-pay | Admitting: Psychology

## 2015-07-13 ENCOUNTER — Ambulatory Visit (HOSPITAL_COMMUNITY): Payer: Self-pay | Admitting: Psychiatry

## 2015-07-21 ENCOUNTER — Ambulatory Visit (HOSPITAL_COMMUNITY): Payer: Self-pay | Admitting: Psychology

## 2015-08-05 ENCOUNTER — Other Ambulatory Visit (HOSPITAL_COMMUNITY): Payer: Self-pay | Admitting: Endocrinology

## 2015-08-05 DIAGNOSIS — R1310 Dysphagia, unspecified: Secondary | ICD-10-CM

## 2015-08-10 ENCOUNTER — Other Ambulatory Visit: Payer: Self-pay | Admitting: Endocrinology

## 2015-08-10 DIAGNOSIS — E041 Nontoxic single thyroid nodule: Secondary | ICD-10-CM

## 2015-08-11 ENCOUNTER — Inpatient Hospital Stay (HOSPITAL_COMMUNITY): Admission: RE | Admit: 2015-08-11 | Payer: Self-pay | Source: Ambulatory Visit

## 2015-08-11 ENCOUNTER — Ambulatory Visit (INDEPENDENT_AMBULATORY_CARE_PROVIDER_SITE_OTHER): Payer: Medicare Other | Admitting: Psychiatry

## 2015-08-11 ENCOUNTER — Encounter (HOSPITAL_COMMUNITY): Payer: Self-pay | Admitting: Psychiatry

## 2015-08-11 VITALS — BP 134/89 | HR 83 | Ht 71.0 in | Wt 209.2 lb

## 2015-08-11 DIAGNOSIS — F332 Major depressive disorder, recurrent severe without psychotic features: Secondary | ICD-10-CM | POA: Diagnosis not present

## 2015-08-11 MED ORDER — ALPRAZOLAM 0.5 MG PO TABS: 0.5000 mg | ORAL_TABLET | Freq: Three times a day (TID) | ORAL | Status: DC | PRN

## 2015-08-11 MED ORDER — DULOXETINE HCL 60 MG PO CPEP: 60.0000 mg | ORAL_CAPSULE | Freq: Every day | ORAL | Status: DC

## 2015-08-11 MED ORDER — ZOLPIDEM TARTRATE 10 MG PO TABS: 10.0000 mg | ORAL_TABLET | Freq: Every evening | ORAL | Status: DC | PRN

## 2015-08-11 MED ORDER — BUPROPION HCL ER (XL) 150 MG PO TB24: 300.0000 mg | ORAL_TABLET | ORAL | Status: DC
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
## 2015-08-24 ENCOUNTER — Other Ambulatory Visit: Payer: Self-pay | Admitting: Endocrinology

## 2015-08-24 ENCOUNTER — Inpatient Hospital Stay
Admission: RE | Admit: 2015-08-24 | Discharge: 2015-08-24 | Disposition: A | Discharge status: Home / Self Care | Payer: Self-pay | Source: Ambulatory Visit | Attending: Endocrinology | Admitting: Endocrinology

## 2015-08-24 DIAGNOSIS — E041 Nontoxic single thyroid nodule: Secondary | ICD-10-CM

## 2015-08-29 ENCOUNTER — Ambulatory Visit (HOSPITAL_COMMUNITY): Payer: Self-pay | Admitting: Psychology

## 2015-09-06 ENCOUNTER — Inpatient Hospital Stay: Admission: RE | Admit: 2015-09-06 | Payer: Self-pay | Source: Ambulatory Visit

## 2015-09-07 ENCOUNTER — Other Ambulatory Visit (HOSPITAL_COMMUNITY): Payer: Self-pay | Admitting: Psychiatry

## 2015-09-15 ENCOUNTER — Other Ambulatory Visit: Payer: Self-pay | Admitting: Endocrinology

## 2015-09-15 ENCOUNTER — Inpatient Hospital Stay: Admission: RE | Admit: 2015-09-15 | Payer: Self-pay | Source: Ambulatory Visit

## 2015-09-15 DIAGNOSIS — E041 Nontoxic single thyroid nodule: Secondary | ICD-10-CM

## 2015-09-19 ENCOUNTER — Telehealth (HOSPITAL_COMMUNITY): Payer: Self-pay | Admitting: *Deleted

## 2015-09-19 NOTE — Telephone Encounter (Signed)
Pt called stating she is having family problems right now and would like for Dr. Kieth Brightlyodenbough to call her. Pt wanted sooner appt but was told there was no open slots available and was sch for 10-20-15.

## 2015-09-21 ENCOUNTER — Inpatient Hospital Stay: Admission: RE | Admit: 2015-09-21 | Payer: Self-pay | Source: Ambulatory Visit

## 2015-09-22 ENCOUNTER — Other Ambulatory Visit: Payer: Self-pay | Admitting: Endocrinology

## 2015-09-22 ENCOUNTER — Ambulatory Visit
Admission: RE | Admit: 2015-09-22 | Discharge: 2015-09-22 | Disposition: A | Discharge status: Home / Self Care | Payer: Medicare Other | Source: Ambulatory Visit | Attending: Endocrinology | Admitting: Endocrinology

## 2015-09-22 ENCOUNTER — Encounter (INDEPENDENT_AMBULATORY_CARE_PROVIDER_SITE_OTHER): Payer: Self-pay

## 2015-09-22 DIAGNOSIS — E041 Nontoxic single thyroid nodule: Secondary | ICD-10-CM

## 2015-09-30 ENCOUNTER — Ambulatory Visit (HOSPITAL_COMMUNITY): Payer: Self-pay | Admitting: Psychology

## 2015-10-20 ENCOUNTER — Ambulatory Visit (INDEPENDENT_AMBULATORY_CARE_PROVIDER_SITE_OTHER): Payer: Medicare Other | Admitting: Psychology

## 2015-10-20 DIAGNOSIS — F332 Major depressive disorder, recurrent severe without psychotic features: Secondary | ICD-10-CM

## 2015-10-20 DIAGNOSIS — G894 Chronic pain syndrome: Secondary | ICD-10-CM | POA: Diagnosis not present

## 2015-10-21 NOTE — Progress Notes (Deleted)
Patient:   [name]   DOB:   16-Nov-1966  MR Number:  [id number]  Location:  BEHAVIORAL [facility] [address] [place] [address] Dept: [phone]           Date of Service:   ***  Start Time:   *** End Time:   ***  Provider/Observer:  Hershal Coria PSYD       Billing Code/Service: [phone]  Chief Complaint:    No chief complaint on file.   Reason for Service:  ***  Current Status:  ***  Reliability of Information: ***  Behavioral Observation: [name]  presents as a 49 y.o.-year-old {Handed:22697} {Race/ethnicity:17218} {INFANT GENDER IN OR:22171} who appeared her stated age. her dress was {Desc;appropriate/inappropriate:5787::"Appropriate"} and she was {Appearance:22683} and her manners were {Desc;appropriate/inappropriate:5787::"Appropriate"} to the situation.  There {were/were WUX:32440} any physical disabilities noted.  she displayed an {Desc; ppropriate/inappropriate:30686::"appropriate"} level of cooperation and motivation.      Interactions:    {BHH PARTICIPATION NUUVO:53664}   Attention:   {Desc; normal/abnormal/low/high:18745}  Memory:   {Desc; normal/abnormal/low/high:18745}  Visuo-spatial:   {Desc; normal/abnormal/low/high:18745}  Speech (Volume):  {desc; low/normal/high/v QIHK:74259}  Speech:   {findings; speech psych:31885}  Thought Process:  {BHH THOUGHT PROCESS:22309}  Though Content:  {BHH THOUGHT CONTENT:22310}  Orientation:   {orientation:30299}  Judgment:   {BHH JUDGMENT:22312}  Planning:   {BHH JUDGMENT:22312}  Affect:    {BHH AFFECT:22266}  Mood:    {BHH MOOD:22306}  Insight:   {Insight (PAA):22695}  Intelligence:   {desc; low/normal/high/v DGLO:75643}  Marital Status/Living: ***  Current Employment: ***  Past Employment:  ***  Substance Use:  {Substance abuse:20568}  ***  Education:   {Education:22679}  Medical History:   Past  Medical History  Diagnosis Date  . Depression   . Diabetes mellitus   . LBP (low back pain)   . BMI (body mass index) 20.0-29.9 2008 192 lbs  . GERD (gastroesophageal reflux disease) 2002    202 lbs  . Irritable bowel syndrome 2008    diarrhea predominant MEDS TRIED: BENTYL, LEVBID  . RUQ abdominal pain 2004 HIDA NL CTA w/ IVC-FATTY LIVER, ANGIOMYOLIPOMA LEFT KIDNEY    2008 HIDA NL  . PTSD (post-traumatic stress disorder)   . Hypertension   . Complication of anesthesia     post op N/V  . PONV (postoperative nausea and vomiting)         Outpatient Encounter Prescriptions as of 10/20/2015  Medication Sig  . ALPRAZolam (XANAX) 0.5 MG tablet Take 1 tablet (0.5 mg total) by mouth 3 (three) times daily as needed for sleep or anxiety.  Marland Kitchen aspirin 81 MG tablet Take 81 mg by mouth every morning.   Marland Kitchen atorvastatin (LIPITOR) 10 MG tablet Take 1 tablet by mouth daily.  Marland Kitchen buPROPion (WELLBUTRIN XL) 150 MG 24 hr tablet Take 2 tablets (300 mg total) by mouth every morning.  . Cholecalciferol (VITAMIN D PO) Take 5,000 Units by mouth daily.  Marland Kitchen dexlansoprazole (DEXILANT) 60 MG capsule Take 1 capsule (60 mg total) by mouth daily.  . DULoxetine (CYMBALTA) 60 MG capsule Take 1 capsule (60 mg total) by mouth daily.  . fexofenadine (ALLEGRA) 180 MG tablet Take 180 mg by mouth every morning.   . furosemide (LASIX) 20 MG tablet Take 20 mg by mouth 2 (two) times daily.    Marland Kitchen ibuprofen (ADVIL,MOTRIN) 800 MG tablet Take 800 mg by mouth as needed for headache.  . lidocaine (LIDODERM) 5 %  Place 1 patch onto the skin every 12 (twelve) hours as needed (for pain).   . Liraglutide (VICTOZA) 18 MG/3ML SOPN Inject 1.8 mg into the skin every evening.   Marland Kitchen. LYRICA 75 MG capsule Take 75 mg by mouth 2 (two) times daily.   . metFORMIN (GLUCOPHAGE-XR) 500 MG 24 hr tablet Take 500 mg by mouth daily.  . metoprolol (LOPRESSOR) 100 MG tablet Take 100 mg by mouth 2 (two) times daily.   . Multiple Vitamin (MULTIVITAMIN) capsule Take 1  capsule by mouth daily.    . ONE TOUCH ULTRA TEST test strip   . oxycodone (ROXICODONE) 30 MG immediate release tablet Take 30 mg by mouth every 6 (six) hours as needed. For pain  . pantoprazole (PROTONIX) 40 MG tablet TAKE (1) TABLET BY MOUTH ONCE DAILY.  . QC PEN NEEDLES 31G X 6 MM MISC   . valsartan-hydrochlorothiazide (DIOVAN-HCT) 160-25 MG per tablet Take 1 tablet by mouth daily.  . VOLTAREN 1 % GEL Apply 2 g topically daily as needed (pain).   Marland Kitchen. zolpidem (AMBIEN) 10 MG tablet Take 1 tablet (10 mg total) by mouth at bedtime as needed for sleep.   No facility-administered encounter medications on file as of 10/20/2015.        ***  Sexual History:   History  Sexual Activity  . Sexual Activity: Not on file    Abuse/Trauma History: ***  Psychiatric History:  ***  Family Med/Psych History:  Family History  Problem Relation Age of Onset  . Breast cancer Maternal Aunt   . Anxiety disorder Maternal Aunt   . Colon polyps Neg Hx   . Colon cancer Neg Hx   . ADD / ADHD Neg Hx   . Bipolar disorder Neg Hx   . Depression Neg Hx   . OCD Neg Hx   . Paranoid behavior Neg Hx   . Schizophrenia Neg Hx   . Sexual abuse Neg Hx   . Physical abuse Neg Hx   . Anxiety disorder Mother   . Stroke Mother   . COPD Mother   . Hypertension Mother   . Thyroid disease Mother   . Diabetes Mother   . Drug abuse Daughter   . Anxiety disorder Daughter   . Seizures Sister   . Alcohol abuse Maternal Uncle   . Mental illness Cousin   . Dementia Maternal Aunt   . Anxiety disorder Sister   . Anxiety disorder Sister   . Drug abuse Sister   . Early death Sister   . Heart disease Sister   . Heart attack Sister     Risk of Suicide/Violence: {desc; high/low:14016} ***  Impression/DX:  ***  Disposition/Plan:  ***  Diagnosis:    Axis I:  No diagnosis found.      Axis II: {psych axis 2:31910}   Electronically Signed   _______________________ Arley PhenixJohn Rayjon Wery, Psy.D.

## 2015-10-24 ENCOUNTER — Other Ambulatory Visit: Payer: Self-pay | Admitting: Obstetrics and Gynecology

## 2015-10-25 ENCOUNTER — Encounter (HOSPITAL_COMMUNITY): Payer: Self-pay | Admitting: Psychology
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
## 2015-10-31 ENCOUNTER — Other Ambulatory Visit: Payer: Self-pay | Admitting: Obstetrics and Gynecology

## 2015-11-03 ENCOUNTER — Ambulatory Visit (INDEPENDENT_AMBULATORY_CARE_PROVIDER_SITE_OTHER): Payer: Medicare Other | Admitting: Psychology

## 2015-11-03 ENCOUNTER — Telehealth (HOSPITAL_COMMUNITY): Payer: Self-pay | Admitting: *Deleted

## 2015-11-03 ENCOUNTER — Encounter (HOSPITAL_COMMUNITY): Payer: Self-pay | Admitting: Psychology

## 2015-11-03 ENCOUNTER — Other Ambulatory Visit (HOSPITAL_COMMUNITY): Payer: Self-pay | Admitting: Psychiatry

## 2015-11-03 DIAGNOSIS — G894 Chronic pain syndrome: Secondary | ICD-10-CM | POA: Diagnosis not present

## 2015-11-03 DIAGNOSIS — F332 Major depressive disorder, recurrent severe without psychotic features: Secondary | ICD-10-CM

## 2015-11-03 MED ORDER — BUPROPION HCL ER (XL) 150 MG PO TB24: 300.0000 mg | ORAL_TABLET | ORAL | Status: DC

## 2015-11-03 MED ORDER — DULOXETINE HCL 60 MG PO CPEP: 60.0000 mg | ORAL_CAPSULE | Freq: Every day | ORAL | Status: DC

## 2015-11-03 NOTE — Progress Notes (Signed)
   Patient:  [name]   DOB: [birth year]  MR Number: [id number]  [facility] [address] [place] [address] Dept: [phone]  Start: 3 PM End: 4 PM  Provider/Observer:     Hershal CoriaJohn R Rubbie Goostree PSYD  Chief Complaint:      Chief Complaint  Patient presents with  . Anxiety  . Depression  . Stress    Reason For Service:     The patient was referred because of increasing symptoms of depression. Relationship issues, financial problems, medical issues, and loss of her brother in 2002 were all problematic situations for her to deal with. Most recently, major conflicts between her, her knees, and her daughter have also been quite disruptive. At this point, the patient has continued to struggle with severe back issues along with severe depression.  Interventions Strategy:  Cognitive/behavioral psychotherapy  Participation Level:   Active  Participation Quality:  Appropriate      Behavioral Observation:  Well Groomed, Alert, and Appropriate.   Current Psychosocial Factors: The patient reports that there continue to be major stressors within the family. Her sister is now been taken out of intensive care but is still in the hospital and receiving intensive types of medical interventions. The patient reports that she is continued to be responsible as the matriarch of the family even though this is not something that she desires are expects..    Content of Session:   Reviewed current symptoms and continued work on therapeutic interventions  Current Status:   The patient reports that she has had some improvement in the intensity of her depression anxiety with some alleviation some of the psychosocial pressures to some degree but they continue to be quite significant.  Patient Progress:   Stable  Target Goals:   Target goals include reducing symptoms of depression, anxiety, and increase  coping skills.  Last Reviewed:   11/03/2015  Goals Addressed Today:    Today we worked on adjusted issues with the dissolution of her relationship with "PJ"  Impression/Diagnosis:   The patient has had significant psychosocial stressors throughout her life going on going back to childhood. Most recently she has been taking care of her g as well as her sister's children. Her sister died several years ago and she has raised her sister's children. She also adopted one of her sister's children her. The patient has her own daughter who has that was serious and significant substance abuse. The patient has recurrent major depressive events.randmother who had a severe stroke  Diagnosis:    Axis I:  Major depressive disorder, recurrent, severe without psychotic features (HCC)  Pain syndrome, chronic      Axis II: No diagnosis

## 2015-11-03 NOTE — Telephone Encounter (Signed)
You may send in one month supply 

## 2015-11-03 NOTE — Telephone Encounter (Signed)
Pt pharmacy requesting refills for her Xanax. Per pt, she only have 5 days worth. Pt was scheduled a later appt due to provider not having sooner appt slot. Pt medication was last printed 08-11-15 with 2 refills. Pt number is [phone]

## 2015-11-03 NOTE — Telephone Encounter (Signed)
Pt pharmacy requesting refills for pt Bupropion HCL XL 150 QD and Duloxetine HCL DR 60 mg QD. Pt medications last filled 08-11-15. Pt was scheduled at a later appt due to provider not having sooner appt slot. Pt number is [phone]

## 2015-11-03 NOTE — Telephone Encounter (Signed)
sent 

## 2015-11-07 ENCOUNTER — Ambulatory Visit (INDEPENDENT_AMBULATORY_CARE_PROVIDER_SITE_OTHER): Payer: Medicare Other | Admitting: Obstetrics and Gynecology

## 2015-11-07 ENCOUNTER — Encounter: Payer: Self-pay | Admitting: Obstetrics and Gynecology

## 2015-11-07 VITALS — BP 118/80 | HR 80 | Ht 71.0 in | Wt 209.6 lb

## 2015-11-07 DIAGNOSIS — N838 Other noninflammatory disorders of ovary, fallopian tube and broad ligament: Secondary | ICD-10-CM

## 2015-11-07 DIAGNOSIS — Z01411 Encounter for gynecological examination (general) (routine) with abnormal findings: Secondary | ICD-10-CM | POA: Diagnosis not present

## 2015-11-07 DIAGNOSIS — N839 Noninflammatory disorder of ovary, fallopian tube and broad ligament, unspecified: Secondary | ICD-10-CM

## 2015-11-07 DIAGNOSIS — Z139 Encounter for screening, unspecified: Secondary | ICD-10-CM

## 2015-11-07 NOTE — Progress Notes (Signed)
Patient ID: Cynthia LarkFelice A Carano, female   DOB: Apr 12, 1966, [age] y.o.   MRN: [id number] Pt here today for annual exam. Pt wants to discuss hot flashes.

## 2015-11-07 NOTE — Telephone Encounter (Signed)
noted 

## 2015-11-07 NOTE — Progress Notes (Signed)
Patient ID: [name], female   DOB: 1966-05-03, 49 y.o.   MRN: [id number]  Assessment:  Annual Gyn Exam 1.  Left adnexal mass   Plan:  1. pap smear not indicated s/p total abdominal hysterectomy  2. return annually or prn 3    Annual mammogram advised 4.   Plan to schedule transvaginal non-OB US Subjective:  [name] is a 49 y.o. female No obstetric history on file. who presents for annual exam. No LMP recorded. Patient has had a hysterectomy. The patient is here today complaining of left-sided lower abdominal pain. Pt states pain is worsened with movement and bending/straining and unaffected by movement or sexual activity. Pt has h/o DM, HTN, total abdominal hysterectomy. PCP is Dr. Clelia CroftShaw in [place].   The following portions of the patient's history were reviewed and updated as appropriate: allergies, current medications, past family history, past medical history, past social history, past surgical history and problem list. Past Medical History  Diagnosis Date  . Depression   . Diabetes mellitus   . LBP (low back pain)   . BMI (body mass index) 20.0-29.9 2008 192 lbs  . GERD (gastroesophageal reflux disease) 2002    202 lbs  . Irritable bowel syndrome 2008    diarrhea predominant MEDS TRIED: BENTYL, LEVBID  . RUQ abdominal pain 2004 HIDA NL CTA w/ IVC-FATTY LIVER, ANGIOMYOLIPOMA LEFT KIDNEY    2008 HIDA NL  . PTSD (post-traumatic stress disorder)   . Hypertension   . Complication of anesthesia     post op N/V  . PONV (postoperative nausea and vomiting)     Past Surgical History  Procedure Laterality Date  . Total abdominal hysterectomy  2000 fibroids/DUB  . Shoulder surgery  1997 RIGHT  . Breast reduction surgery  2006  . Lumbar disc surgery  2005  . Ankle surgery  RIGHT  . Colonoscopy  2008 RMR    PAN-COLONIC TICS, NL TI  . Upper gastrointestinal endoscopy  2008 RMR    SML HH o/w NL  . Esophageal dilation N/A 04/06/2015    Procedure: ESOPHAGEAL DILATION;  Surgeon:  Corbin Adeobert M Rourk, MD;  Location: [facility];  Service: Gastroenterology;  Laterality: N/A;  300  . Esophagogastroduodenoscopy N/A 04/06/2015    RMR: Normal EGD status post Maloney dilation of teh esophagus as described above  . Vocal cord polyp removal  2013    Dr. Josephina ShihEwain Wilson  . Colonoscopy  2013    Dr. Teena DunkBenson: diverticulum in ascending colon, mild diverticulosis in sigmoid. Recommended 5 year surveillance     Current outpatient prescriptions:  .  ALPRAZolam (XANAX) 0.5 MG tablet, Take 1 tablet (0.5 mg total) by mouth 3 (three) times daily as needed for sleep or anxiety., Disp: 90 tablet, Rfl: 2 .  aspirin 81 MG tablet, Take 81 mg by mouth every morning. , Disp: , Rfl:  .  atorvastatin (LIPITOR) 10 MG tablet, Take 1 tablet by mouth daily., Disp: , Rfl:  .  buPROPion (WELLBUTRIN XL) 150 MG 24 hr tablet, Take 2 tablets (300 mg total) by mouth every morning., Disp: 60 tablet, Rfl: 2 .  Cholecalciferol (VITAMIN D PO), Take 5,000 Units by mouth daily., Disp: , Rfl:  .  dexlansoprazole (DEXILANT) 60 MG capsule, Take 1 capsule (60 mg total) by mouth daily., Disp: 30 capsule, Rfl: 11 .  DULoxetine (CYMBALTA) 60 MG capsule, Take 1 capsule (60 mg total) by mouth daily., Disp: 30 capsule, Rfl: 2 .  fexofenadine (ALLEGRA) 180 MG tablet,  Take 180 mg by mouth every morning. , Disp: , Rfl:  .  furosemide (LASIX) 20 MG tablet, Take 20 mg by mouth 2 (two) times daily.  , Disp: , Rfl:  .  ibuprofen (ADVIL,MOTRIN) 800 MG tablet, Take 800 mg by mouth as needed for headache., Disp: , Rfl:  .  lidocaine (LIDODERM) 5 %, Place 1 patch onto the skin every 12 (twelve) hours as needed (for pain). , Disp: , Rfl:  .  Liraglutide (VICTOZA) 18 MG/3ML SOPN, Inject 1.8 mg into the skin every evening. , Disp: , Rfl:  .  LYRICA 75 MG capsule, Take 75 mg by mouth 2 (two) times daily. , Disp: , Rfl:  .  metFORMIN (GLUCOPHAGE-XR) 500 MG 24 hr tablet, Take 500 mg by mouth daily., Disp: , Rfl:  .  metoprolol (LOPRESSOR) 100 MG  tablet, Take 100 mg by mouth 2 (two) times daily. , Disp: , Rfl:  .  Multiple Vitamin (MULTIVITAMIN) capsule, Take 1 capsule by mouth daily.  , Disp: , Rfl:  .  ONE TOUCH ULTRA TEST test strip, , Disp: , Rfl:  .  oxycodone (ROXICODONE) 30 MG immediate release tablet, Take 30 mg by mouth every 6 (six) hours as needed. For pain, Disp: , Rfl:  .  pantoprazole (PROTONIX) 40 MG tablet, TAKE (1) TABLET BY MOUTH ONCE DAILY., Disp: 30 tablet, Rfl: 2 .  QC PEN NEEDLES 31G X 6 MM MISC, , Disp: , Rfl:  .  valsartan-hydrochlorothiazide (DIOVAN-HCT) 160-25 MG per tablet, Take 1 tablet by mouth daily., Disp: , Rfl:  .  VOLTAREN 1 % GEL, Apply 2 g topically daily as needed (pain). , Disp: , Rfl:  .  zolpidem (AMBIEN) 10 MG tablet, Take 1 tablet (10 mg total) by mouth at bedtime as needed for sleep., Disp: 30 tablet, Rfl: 2  Review of Systems Constitutional: negative Gastrointestinal: negative Genitourinary: negative   Objective:  BP 118/80 mmHg  Pulse 80  Ht  (1.803 m)  Wt 209 lb 9.6 oz (95.074 kg)  BMI 29.25 kg/m2   BMI: Body mass index is 29.25 kg/(m^2).  General Appearance: Alert, appropriate appearance for age. No acute distress HEENT: Grossly normal Neck / Thyroid:  Cardiovascular: RRR; normal S1, S2, no murmur Lungs: CTA bilaterally Back: No CVAT Breast Exam: No masses or nodes.No dimpling, nipple retraction or discharge. Gastrointestinal: Soft, non-tender, no masses or organomegaly Pelvic Exam: External genitalia: normal general appearance Vaginal: normal mucosa without prolapse or lesions and the cuff is smooth.  Cervix: absent and removed surgically Adnexa: There is an area of tenderness to the left.  Uterus: absent and removed surgically  There is a small firm tender somewhat mobile mass in left adx close to the cuff.   Rectovaginal: normal rectal, no masses and guaiac negative stool obtained Lymphatic Exam: Non-palpable nodes in neck, clavicular, axillary, or inguinal regions   Skin: no rash or abnormalities Neurologic: Normal gait and speech, no tremor  Psychiatric: Alert and oriented, appropriate affect.  Urinalysis:Not done  Hemoccult: negative   Christin Bach. MD Pgr [phone] 12:27 PM   By signing my name below, I, Doreatha Martin, attest that this documentation has been prepared under the direction and in the presence of Tilda Burrow, MD. Electronically Signed: Doreatha Martin, ED Scribe. 11/07/2015. 12:16 PM.  I personally performed the services described in this documentation, which was SCRIBED in my presence. The recorded information has been reviewed and considered accurate. It has been edited as necessary during review. Tilda Burrow,  MD

## 2015-11-08 LAB — CA 125: CA 125: 14.6 U/mL (ref 0.0–38.1)

## 2015-11-08 LAB — RPR: RPR: NONREACTIVE

## 2015-11-08 LAB — HIV ANTIBODY (ROUTINE TESTING W REFLEX): HIV SCREEN 4TH GENERATION: NONREACTIVE

## 2015-11-15 ENCOUNTER — Ambulatory Visit (HOSPITAL_COMMUNITY): Payer: Self-pay | Admitting: Psychiatry

## 2015-11-15 ENCOUNTER — Encounter (HOSPITAL_COMMUNITY): Payer: Self-pay | Admitting: Psychiatry

## 2015-11-16 ENCOUNTER — Ambulatory Visit (INDEPENDENT_AMBULATORY_CARE_PROVIDER_SITE_OTHER): Payer: Medicare Other | Admitting: Psychiatry

## 2015-11-16 ENCOUNTER — Encounter (HOSPITAL_COMMUNITY): Payer: Self-pay | Admitting: Psychiatry

## 2015-11-16 ENCOUNTER — Other Ambulatory Visit: Payer: Self-pay | Admitting: Obstetrics and Gynecology

## 2015-11-16 VITALS — BP 124/88 | HR 88 | Ht 71.0 in | Wt 209.0 lb

## 2015-11-16 DIAGNOSIS — F332 Major depressive disorder, recurrent severe without psychotic features: Secondary | ICD-10-CM

## 2015-11-16 DIAGNOSIS — R102 Pelvic and perineal pain: Secondary | ICD-10-CM

## 2015-11-16 MED ORDER — DULOXETINE HCL 60 MG PO CPEP: 60.0000 mg | ORAL_CAPSULE | Freq: Every day | ORAL | Status: DC

## 2015-11-16 MED ORDER — ALPRAZOLAM 0.5 MG PO TABS: 0.5000 mg | ORAL_TABLET | Freq: Three times a day (TID) | ORAL | Status: DC | PRN

## 2015-11-16 MED ORDER — BUPROPION HCL ER (XL) 150 MG PO TB24: 300.0000 mg | ORAL_TABLET | ORAL | Status: DC

## 2015-11-16 MED ORDER — ZOLPIDEM TARTRATE ER 12.5 MG PO TBCR: 12.5000 mg | EXTENDED_RELEASE_TABLET | Freq: Every evening | ORAL | Status: DC | PRN
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
## 2015-11-16 NOTE — Telephone Encounter (Signed)
Pt came into office for Xanax refills

## 2015-11-17 ENCOUNTER — Encounter (HOSPITAL_COMMUNITY)
Admission: RE | Admit: 2015-11-17 | Discharge: 2015-11-17 | Disposition: A | Discharge status: Home / Self Care | Payer: Medicare Other | Source: Ambulatory Visit | Attending: Obstetrics and Gynecology | Admitting: Obstetrics and Gynecology

## 2015-11-17 ENCOUNTER — Encounter (HOSPITAL_COMMUNITY): Payer: Self-pay

## 2015-11-17 ENCOUNTER — Ambulatory Visit (INDEPENDENT_AMBULATORY_CARE_PROVIDER_SITE_OTHER): Payer: Medicare Other | Admitting: Obstetrics and Gynecology

## 2015-11-17 ENCOUNTER — Other Ambulatory Visit: Payer: Self-pay | Admitting: Obstetrics and Gynecology

## 2015-11-17 ENCOUNTER — Ambulatory Visit (INDEPENDENT_AMBULATORY_CARE_PROVIDER_SITE_OTHER): Payer: Medicare Other

## 2015-11-17 ENCOUNTER — Encounter: Payer: Self-pay | Admitting: Obstetrics and Gynecology

## 2015-11-17 VITALS — BP 132/88 | HR 80 | Ht 71.0 in | Wt 210.0 lb

## 2015-11-17 DIAGNOSIS — R102 Pelvic and perineal pain: Secondary | ICD-10-CM | POA: Diagnosis not present

## 2015-11-17 DIAGNOSIS — N951 Menopausal and female climacteric states: Secondary | ICD-10-CM

## 2015-11-17 DIAGNOSIS — Z01818 Encounter for other preprocedural examination: Secondary | ICD-10-CM | POA: Insufficient documentation

## 2015-11-17 DIAGNOSIS — N736 Female pelvic peritoneal adhesions (postinfective): Secondary | ICD-10-CM | POA: Diagnosis not present

## 2015-11-17 DIAGNOSIS — R002 Palpitations: Secondary | ICD-10-CM

## 2015-11-17 LAB — BASIC METABOLIC PANEL
ANION GAP: 14 (ref 5–15)
BUN: 9 mg/dL (ref 6–20)
CALCIUM: 9.4 mg/dL (ref 8.9–10.3)
CHLORIDE: 97 mmol/L — AB (ref 101–111)
CO2: 26 mmol/L (ref 22–32)
CREATININE: 0.9 mg/dL (ref 0.44–1.00)
GFR calc non Af Amer: >60 mL/min (ref >60)
Glucose, Bld: 245 mg/dL — ABNORMAL HIGH (ref 65–99)
Potassium: 3.4 mmol/L — ABNORMAL LOW (ref 3.5–5.1)
SODIUM: 137 mmol/L (ref 135–145)

## 2015-11-17 LAB — CBC
HCT: 39.1 % (ref 36.0–46.0)
HEMOGLOBIN: 13.3 g/dL (ref 12.0–15.0)
MCH: 31.6 pg (ref 26.0–34.0)
MCHC: 34 g/dL (ref 30.0–36.0)
MCV: 92.9 fL (ref 78.0–100.0)
PLATELETS: 304 10*3/uL (ref 150–400)
RBC: 4.21 MIL/uL (ref 3.87–5.11)
RDW: 12.9 % (ref 11.5–15.5)
WBC: 10.7 10*3/uL — AB (ref 4.0–10.5)

## 2015-11-17 LAB — URINALYSIS, ROUTINE W REFLEX MICROSCOPIC
Bilirubin Urine: NEGATIVE
GLUCOSE, UA: 250 mg/dL — AB
HGB URINE DIPSTICK: NEGATIVE
Leukocytes, UA: NEGATIVE
Nitrite: NEGATIVE
PROTEIN: NEGATIVE mg/dL
Specific Gravity, Urine: 1.02 (ref 1.005–1.030)
pH: 7 (ref 5.0–8.0)

## 2015-11-17 LAB — TYPE AND SCREEN
ABO/RH(D): A POS
Antibody Screen: NEGATIVE

## 2015-11-17 NOTE — Progress Notes (Signed)
Patient ID: [name], female   DOB: [birth year], 49 y.o.   MRN: [id number]   [facility] Visit  Patient name: [name] MRN [id number]  Date of birth: 17-Jul-1966  CC & HPI:  [name] is a 49 y.o. female with h/o total abdominal hysterectomy, breast reduction surgery, DM, HTN presenting today for follow up for results of transvaginal non-OB US today. Pt was seen in the office on 11/07/15 and physical exam showed a small, firm, tender, somewhat mobile mass in left adx close to the cuff. Pt reports significant pain on the left side during [place] today. She notes her pain has been worsening and constant since onset whick was over a year ago, and notable with coughing or valsalve, as well as dyspareunia. Pt also reports 2 years of vasomotor symptoms.  Surgical options of left ovary and tube removal discussed; pt requests that both ovaries and tubes be removed. Pt highly in favor of proceeding surgically.  Risks , rationale, alternatives to bilateral salpingo oophorectomy discussed.  ROS:  A complete 10 system review of systems was obtained and all systems are negative except as noted in the HPI and PMH.    Pertinent History Reviewed:   Reviewed: Significant for total abdominal hysterectomy, breast reduction surgery, DM, HTN Medical         Past Medical History  Diagnosis Date  . Depression   . Diabetes mellitus   . LBP (low back pain)   . BMI (body mass index) 20.0-29.9 2008 192 lbs  . GERD (gastroesophageal reflux disease) 2002    202 lbs  . Irritable bowel syndrome 2008    diarrhea predominant MEDS TRIED: BENTYL, LEVBID  . RUQ abdominal pain 2004 HIDA NL CTA w/ IVC-FATTY LIVER, ANGIOMYOLIPOMA LEFT KIDNEY    2008 HIDA NL  . PTSD (post-traumatic stress disorder)   . Hypertension   . Complication of anesthesia     post op N/V  . PONV (postoperative nausea and vomiting)                               Surgical Hx:    Past Surgical History  Procedure Laterality Date  .  Total abdominal hysterectomy  2000 fibroids/DUB  . Shoulder surgery  1997 RIGHT  . Breast reduction surgery  2006  . Lumbar disc surgery  2005  . Ankle surgery  RIGHT  . Colonoscopy  2008 RMR    PAN-COLONIC TICS, NL TI  . Upper gastrointestinal endoscopy  2008 RMR    SML HH o/w NL  . Esophageal dilation N/A 04/06/2015    Procedure: ESOPHAGEAL DILATION;  Surgeon: Corbin Ade, MD;  Location: [facility];  Service: Gastroenterology;  Laterality: N/A;  300  . Esophagogastroduodenoscopy N/A 04/06/2015    RMR: Normal EGD status post Maloney dilation of teh esophagus as described above  . Vocal cord polyp removal  2013    Dr. Josephina Shih  . Colonoscopy  2013    Dr. Teena Dunk: diverticulum in ascending colon, mild diverticulosis in sigmoid. Recommended 5 year surveillance   Medications: Reviewed & Updated - see associated section                       Current outpatient prescriptions:  .  ALPRAZolam (XANAX) 0.5 MG tablet, Take 1 tablet (0.5 mg total) by mouth 3 (three) times daily as needed for sleep or anxiety., Disp: 90 tablet,  Rfl: 2 .  aspirin 81 MG tablet, Take 81 mg by mouth every morning. , Disp: , Rfl:  .  atorvastatin (LIPITOR) 10 MG tablet, Take 1 tablet by mouth daily., Disp: , Rfl:  .  buPROPion (WELLBUTRIN XL) 150 MG 24 hr tablet, Take 2 tablets (300 mg total) by mouth every morning., Disp: 60 tablet, Rfl: 2 .  Cholecalciferol (VITAMIN D PO), Take 5,000 Units by mouth daily., Disp: , Rfl:  .  Cyanocobalamin (VITAMIN B-12 PO), Take by mouth daily., Disp: , Rfl:  .  dexlansoprazole (DEXILANT) 60 MG capsule, Take 1 capsule (60 mg total) by mouth daily., Disp: 30 capsule, Rfl: 11 .  DULoxetine (CYMBALTA) 60 MG capsule, Take 1 capsule (60 mg total) by mouth daily., Disp: 30 capsule, Rfl: 2 .  fexofenadine (ALLEGRA) 180 MG tablet, Take 180 mg by mouth every morning. , Disp: , Rfl:  .  furosemide (LASIX) 20 MG tablet, Take 20 mg by mouth 2 (two) times daily.  , Disp: , Rfl:  .   ibuprofen (ADVIL,MOTRIN) 800 MG tablet, Take 800 mg by mouth as needed for headache., Disp: , Rfl:  .  lidocaine (LIDODERM) 5 %, Place 1 patch onto the skin every 12 (twelve) hours as needed (for pain). , Disp: , Rfl:  .  Liraglutide (VICTOZA) 18 MG/3ML SOPN, Inject 1.8 mg into the skin every evening. , Disp: , Rfl:  .  LYRICA 75 MG capsule, Take 75 mg by mouth 2 (two) times daily. , Disp: , Rfl:  .  metFORMIN (GLUCOPHAGE-XR) 500 MG 24 hr tablet, Take 500 mg by mouth daily., Disp: , Rfl:  .  metoprolol (LOPRESSOR) 100 MG tablet, Take 100 mg by mouth 2 (two) times daily. , Disp: , Rfl:  .  Multiple Vitamin (MULTIVITAMIN) capsule, Take 1 capsule by mouth daily.  , Disp: , Rfl:  .  ONE TOUCH ULTRA TEST test strip, , Disp: , Rfl:  .  oxycodone (ROXICODONE) 30 MG immediate release tablet, Take 30 mg by mouth every 6 (six) hours as needed. For pain, Disp: , Rfl:  .  pantoprazole (PROTONIX) 40 MG tablet, TAKE (1) TABLET BY MOUTH ONCE DAILY., Disp: 30 tablet, Rfl: 2 .  QC PEN NEEDLES 31G X 6 MM MISC, , Disp: , Rfl:  .  valsartan-hydrochlorothiazide (DIOVAN-HCT) 160-25 MG per tablet, Take 1 tablet by mouth daily., Disp: , Rfl:  .  VOLTAREN 1 % GEL, Apply 2 g topically daily as needed (pain). , Disp: , Rfl:  .  zolpidem (AMBIEN CR) 12.5 MG CR tablet, Take 1 tablet (12.5 mg total) by mouth at bedtime as needed for sleep., Disp: 30 tablet, Rfl: 2   Social History: Reviewed -  reports that she quit smoking about 6 months ago. Her smoking use included Cigarettes. She has a 1 pack-year smoking history. She has never used smokeless tobacco.  Objective Findings:  Vitals: Blood pressure 132/88, pulse 80, height  (1.803 m), weight 210 lb (95.255 kg).  Physical Examination: General appearance - alert, well appearing, and in no distress Mental status - alert, oriented to person, place, and time  Discussed with pt results of non-OB transvaginal US and risks and benefits of surgical options for left ovarian  adhesions. At end of discussion, pt had opportunity to ask questions and has no further questions at this time. Greater than 50% was spent in counseling and coordination of care with the patient. Total time greater than: 25 minutes    Assessment & Plan:  A:  1. Left adnexal mass on exam on 11/07/15  2. Transvaginal US on 11/17/15 showed left ovarian adhesions  3. Left ovarian pelvic pain due to left ovary adhesions 4. Pt reports 2 years of vasomotor symptoms.  P:  1. Discussed surgical options. 2. Pt requests consideration of BSO.    By signing my name below, I, Doreatha MartinEva Mathews, attest that this documentation has been prepared under the direction and in the presence of Tilda BurrowJohn Seaborn Nakama V, MD. Electronically Signed: Doreatha MartinEva Mathews, ED Scribe. 11/17/2015. 10:20 AM.  I personally performed the services described in this documentation, which was SCRIBED in my presence. The recorded information has been reviewed and considered accurate. It has been edited as necessary during review. Tilda BurrowFERGUSON,Warren Lindahl V, MD

## 2015-11-17 NOTE — Patient Instructions (Signed)
Your procedure is scheduled on: 11/22/2015  Report to [facility] at  [facility]   AM.  Call this number if you have problems the morning of surgery: [phone]   Remember:   Do not drink or eat food:After Midnight.  :  Take these medicines the morning of surgery with A SIP OF WATER: Xanax, Wellbutrin, Dexilant Cymbalta, Allegra and Diovan    Do not wear jewelry, make-up or nail polish.  Do not wear lotions, powders, or perfumes. You may wear deodorant.  Do not shave 48 hours prior to surgery. Men may shave face and neck.  Do not bring valuables to the hospital.  Contacts, dentures or bridgework may not be worn into surgery.  Leave suitcase in the car. After surgery it may be brought to your room.  For patients admitted to the hospital, checkout time is 11:00 AM the day of discharge.   Patients discharged the day of surgery will not be allowed to drive home.    Special Instructions: Shower using CHG night before surgery and shower the day of surgery use CHG.  Use special wash - you have one bottle of CHG for all showers.  You should use approximately 1/2 of the bottle for each shower. Bilateral Salpingo-Oophorectomy Bilateral salpingo-oophorectomy is the surgical removal of both fallopian tubes and both ovaries. The ovaries are small organs that produce eggs in women. The fallopian tubes transport the egg from the ovary to the womb (uterus). Usually, when this surgery is done, the uterus was previously removed. A bilateral salpingo-oophorectomy may be done to treat cancer or to reduce the risk of cancer in women who are at high risk. Removing both fallopian tubes and both ovaries will make you unable to become pregnant (sterile). It will also put you into menopause so that you will no longer have menstrual periods and may have menopausal symptoms such as hot flashes, night sweats, and mood changes. It will not affect your sex drive. LET [facility] CARE PROVIDER KNOW ABOUT:  Any allergies you  have.  All medicines you are taking, including vitamins, herbs, eye drops, creams, and over-the-counter medicines.  Previous problems you or members of your family have had with the use of anesthetics.  Any blood disorders you have.  Previous surgeries you have had.  Medical conditions you have. RISKS AND COMPLICATIONS Generally, this is a safe procedure. However, as with any procedure, complications can occur. Possible complications include:  Injury to surrounding organs.  Bleeding.  Infection.  Blood clots in the legs or lungs.  Problems related to anesthesia. BEFORE THE PROCEDURE  Ask your health care provider about changing or stopping your regular medicines. You may need to stop taking certain medicines, such as aspirin or blood thinners, at least 1 week before the surgery.  Do not eat or drink anything for at least 8 hours before the surgery.  If you smoke, do not smoke for at least 2 weeks before the surgery.  Make plans to have someone drive you home after the procedure or after your hospital stay. Also arrange for someone to help you with activities during recovery. PROCEDURE   You will be given medicine to help you relax before the procedure (sedative). You will then be given medicine to make you sleep through the procedure (general anesthetic). These medicines will be given through an IV access tube that is put into one of your veins.  Once you are asleep, your lower abdomen will be shaved and cleaned. A thin, flexible  tube (catheter) will be placed in your bladder.  The surgeon may use a laparoscopic, robotic, or open technique for this surgery:  In the laparoscopic technique, the surgery is done through two small cuts (incisions) in the abdomen. A thin, lighted tube with a tiny camera on the end (laparoscope) is inserted into one of the incisions. The tools needed for the procedure are put through the other incision.  A robotic technique may be chosen to perform  complex surgery in a small space. In the robotic technique, small incisions will be made. A camera and surgical instruments are passed through the incisions. Surgical instruments will be controlled with the help of a robotic arm.  In the open technique, the surgery is done through one large incision in the abdomen.  Using any of these techniques, the surgeon removes the fallopian tubes and ovaries. The blood vessels will be clamped and tied.  The surgeon then uses staples or stitches to close the incision or incisions. AFTER THE PROCEDURE  You will be taken to a recovery area where you will be monitored for 1 to 3 hours. Your blood pressure, pulse, and temperature will be checked often. You will remain in the recovery area until you are stable and waking up.  If the laparoscopic technique was used, you may be allowed to go home after several hours. You may have some shoulder pain after the laparoscopic procedure. This is normal and usually goes away in a day or two.  If the open technique was used, you will be admitted to the hospital for a couple of days.  You will be given pain medicine as needed.  The IV access tube and catheter will be removed before you are discharged.   This information is not intended to replace advice given to you by your health care provider. Make sure you discuss any questions you have with your health care provider.   Document Released: 11/05/2005 Document Revised: 11/10/2013 Document Reviewed: 04/29/2013 Elsevier Interactive Patient Education 2016 Elsevier Inc.   Bilateral Salpingo-Oophorectomy, Care After Refer to this sheet in the next few weeks. These instructions provide you with information on caring for yourself after your procedure. Your health care provider may also give you more specific instructions. Your treatment has been planned according to current medical practices, but problems sometimes occur. Call your health care provider if you have any  problems or questions after your procedure. WHAT TO EXPECT AFTER THE PROCEDURE After your procedure, it is typical to have the following:   Abdominal pain that can be controlled with medicine.  Vaginal spotting.  Constipation.  Menopausal symptoms such as hot flashes, vaginal dryness, and mood swings. HOME CARE INSTRUCTIONS   Get plenty of rest and sleep.  Only take over-the-counter or prescription medicines as directed by your health care provider. Do not take aspirin. It can cause bleeding.  Keep incision areas clean and dry. Remove or change bandages (dressings) only as directed by your health care provider.  Take showers instead of baths for a few weeks as directed by your health care provider.  Limit exercise and activities as directed by your health care provider. Do not lift anything heavier than 5 pounds (2.3 kg) until your health care provider approves.  Do not drive until your health care provider approves.  Follow your health care provider's advice regarding diet. You may be able to resume your usual diet right away.  Drink enough fluids to keep your urine clear or pale yellow.  Do not  douche, use tampons, or have sexual intercourse for 6 weeks after the procedure.  Do not drink alcohol until your health care provider says it is okay.  Take your temperature twice a day and write it down.  If you become constipated, you may:  Ask your health care provider about taking a mild laxative.  Add more fruit and bran to your diet.  Drink more fluids.  Follow up with your health care provider as directed. SEEK MEDICAL CARE IF:   You have swelling, redness, or increasing pain in the incision area.  You see pus coming from the incision area.  You notice a bad smell coming from the wound or dressing.  You have pain, redness, or swelling where the IV access tube was placed.  Your incision is breaking open (the edges are not staying together).  You feel dizzy or  feel like fainting.  You develop pain or bleeding when you urinate.  You develop diarrhea. General Anesthesia, Adult, Care After Refer to this sheet in the next few weeks. These instructions provide you with information on caring for yourself after your procedure. Your health care provider may also give you more specific instructions. Your treatment has been planned according to current medical practices, but problems sometimes occur. Call your health care provider if you have any problems or questions after your procedure. WHAT TO EXPECT AFTER THE PROCEDURE After the procedure, it is typical to experience: Sleepiness. Nausea and vomiting. HOME CARE INSTRUCTIONS For the first 24 hours after general anesthesia: Have a responsible person with you. Do not drive a car. If you are alone, do not take public transportation. Do not drink alcohol. Do not take medicine that has not been prescribed by your health care provider. Do not sign important papers or make important decisions. You may resume a normal diet and activities as directed by your health care provider. Change bandages (dressings) as directed. If you have questions or problems that seem related to general anesthesia, call the hospital and ask for the anesthetist or anesthesiologist on call. SEEK MEDICAL CARE IF: You have nausea and vomiting that continue the day after anesthesia. You develop a rash. SEEK IMMEDIATE MEDICAL CARE IF:  You have difficulty breathing. You have chest pain. You have any allergic problems.   This information is not intended to replace advice given to you by your health care provider. Make sure you discuss any questions you have with your health care provider.   Document Released: 02/11/2001 Document Revised: 11/26/2014 Document Reviewed: 03/05/2012 Elsevier Interactive Patient Education 2016 [employer]  You develop nausea and vomiting.  You develop abnormal vaginal discharge.  You develop a  rash.  You have pain that is not controlled with medicine. SEEK IMMEDIATE MEDICAL CARE IF:   You develop a fever.  You develop abdominal pain.  You have chest pain.  You develop shortness of breath.  You pass out.  You develop pain, swelling, or redness in your leg.  You develop heavy vaginal bleeding with or without blood clots.   This information is not intended to replace advice given to you by your health care provider. Make sure you discuss any questions you have with your health care provider.   Document Released: 11/05/2005 Document Revised: 07/08/2013 Document Reviewed: 04/29/2013 Elsevier Interactive Patient Education [employer] Elsevier Inc.

## 2015-11-17 NOTE — Progress Notes (Signed)
PELVIC US TV: normal vag cuff,bilat ov's wnl,no free fluid,left adnexal pain during ultrasound,limited movement of lt ov w/ TV probe pressure

## 2015-11-17 NOTE — Patient Instructions (Signed)
Bilateral Salpingo-Oophorectomy °Bilateral salpingo-oophorectomy is the surgical removal of both fallopian tubes and both ovaries. The ovaries are small organs that produce eggs in women. The fallopian tubes transport the egg from the ovary to the womb (uterus). Usually, when this surgery is done, the uterus was previously removed. A bilateral salpingo-oophorectomy may be done to treat cancer or to reduce the risk of cancer in women who are at high risk. °Removing both fallopian tubes and both ovaries will make you unable to become pregnant (sterile). It will also put you into menopause so that you will no longer have menstrual periods and may have menopausal symptoms such as hot flashes, night sweats, and mood changes. It will not affect your sex drive. °LET YOUR HEALTH CARE PROVIDER KNOW ABOUT: °· Any allergies you have. °· All medicines you are taking, including vitamins, herbs, eye drops, creams, and over-the-counter medicines. °· Previous problems you or members of your family have had with the use of anesthetics. °· Any blood disorders you have. °· Previous surgeries you have had. °· Medical conditions you have. °RISKS AND COMPLICATIONS °Generally, this is a safe procedure. However, as with any procedure, complications can occur. Possible complications include: °· Injury to surrounding organs. °· Bleeding. °· Infection. °· Blood clots in the legs or lungs. °· Problems related to anesthesia. °BEFORE THE PROCEDURE °· Ask your health care provider about changing or stopping your regular medicines. You may need to stop taking certain medicines, such as aspirin or blood thinners, at least 1 week before the surgery. °· Do not eat or drink anything for at least 8 hours before the surgery. °· If you smoke, do not smoke for at least 2 weeks before the surgery. °· Make plans to have someone drive you home after the procedure or after your hospital stay. Also arrange for someone to help you with activities during  recovery. °PROCEDURE  °· You will be given medicine to help you relax before the procedure (sedative). You will then be given medicine to make you sleep through the procedure (general anesthetic). These medicines will be given through an IV access tube that is put into one of your veins. °· Once you are asleep, your lower abdomen will be shaved and cleaned. A thin, flexible tube (catheter) will be placed in your bladder. °· The surgeon may use a laparoscopic, robotic, or open technique for this surgery: °¨ In the laparoscopic technique, the surgery is done through two small cuts (incisions) in the abdomen. A thin, lighted tube with a tiny camera on the end (laparoscope) is inserted into one of the incisions. The tools needed for the procedure are put through the other incision. °¨ A robotic technique may be chosen to perform complex surgery in a small space. In the robotic technique, small incisions will be made. A camera and surgical instruments are passed through the incisions. Surgical instruments will be controlled with the help of a robotic arm. °¨ In the open technique, the surgery is done through one large incision in the abdomen. °· Using any of these techniques, the surgeon removes the fallopian tubes and ovaries. The blood vessels will be clamped and tied. °· The surgeon then uses staples or stitches to close the incision or incisions. °AFTER THE PROCEDURE °· You will be taken to a recovery area where you will be monitored for 1 to 3 hours. Your blood pressure, pulse, and temperature will be checked often. You will remain in the recovery area until you are stable and waking   up. °· If the laparoscopic technique was used, you may be allowed to go home after several hours. You may have some shoulder pain after the laparoscopic procedure. This is normal and usually goes away in a day or two. °· If the open technique was used, you will be admitted to the hospital for a couple of days. °· You will be given pain  medicine as needed. °· The IV access tube and catheter will be removed before you are discharged. °  °This information is not intended to replace advice given to you by your health care provider. Make sure you discuss any questions you have with your health care provider. °  °Document Released: 11/05/2005 Document Revised: 11/10/2013 Document Reviewed: 04/29/2013 °Elsevier Interactive Patient Education ©2016 Elsevier Inc. ° °

## 2015-11-21 ENCOUNTER — Other Ambulatory Visit: Payer: Self-pay | Admitting: Obstetrics and Gynecology

## 2015-11-21 NOTE — H&P (Signed)
Progress Notes Info    Chartered loss adjusterAuthor Note Status Last Update User Last Update Date/Time   Tilda BurrowJohn Cailyn Houdek V, MD Signed Tilda BurrowJohn Tallia Moehring V, MD 11/17/2015 12:53 PM    Progress Notes    Expand All Collapse All   Patient ID: Cynthia LarkFelice A Hynes, female DOB: [birth year], 50 y.o. MRN: [id number]  [facility]  Patient name: Cynthia PamFelice A BrimMRN [id number] Date of birth: [birth year]  CC & HPI:  Cynthia LarkFelice A Floren is a 50 y.o. female with h/o total abdominal hysterectomy, breast reduction surgery, DM, HTN presenting today for follow up for results of transvaginal non-OB US today. Pt was seen in the office on 11/07/15 and physical exam showed a small, firm, tender, somewhat mobile mass in left adx close to the cuff. Pt reports significant pain on the left side during US today. She notes her pain has been worsening and constant since onset whick was over a year ago, and notable with coughing or valsalve, as well as dyspareunia. Pt also reports 2 years of vasomotor symptoms.  Surgical options of left ovary and tube removal discussed; pt requests that both ovaries and tubes be removed. Pt highly in favor of proceeding surgically. Risks , rationale, alternatives to bilateral salpingo oophorectomy discussed.  ROS:  A complete 10 system review of systems was obtained and all systems are negative except as noted in the HPI and PMH.   Pertinent History Reviewed:  Reviewed: Significant for total abdominal hysterectomy, breast reduction surgery, DM, HTN Medical  Past Medical History  Diagnosis Date  . Depression   . Diabetes mellitus   . LBP (low back pain)   . BMI (body mass index) 20.0-29.9 2008 192 lbs  . GERD (gastroesophageal reflux disease) 2002    202 lbs  . Irritable bowel syndrome 2008    diarrhea predominant MEDS TRIED: BENTYL, LEVBID  . RUQ abdominal pain 2004 HIDA NL CTA w/ IVC-FATTY LIVER, ANGIOMYOLIPOMA LEFT KIDNEY    2008 HIDA NL  .  PTSD (post-traumatic stress disorder)   . Hypertension   . Complication of anesthesia     post op N/V  . PONV (postoperative nausea and vomiting)     Surgical Hx:  Past Surgical History  Procedure Laterality Date  . Total abdominal hysterectomy  2000 fibroids/DUB  . Shoulder surgery  1997 RIGHT  . Breast reduction surgery  2006  . Lumbar disc surgery  2005  . Ankle surgery  RIGHT  . Colonoscopy  2008 RMR    PAN-COLONIC TICS, NL TI  . Upper gastrointestinal endoscopy  2008 RMR    SML HH o/w NL  . Esophageal dilation N/A 04/06/2015    Procedure: ESOPHAGEAL DILATION; Surgeon: Corbin Adeobert M Rourk, MD; Location: [facility]; Service: Gastroenterology; Laterality: N/A; 300  . Esophagogastroduodenoscopy N/A 04/06/2015    RMR: Normal EGD status post Maloney dilation of teh esophagus as described above  . Vocal cord polyp removal  2013    Dr. Josephina ShihEwain Wilson  . Colonoscopy  2013    Dr. Teena DunkBenson: diverticulum in ascending colon, mild diverticulosis in sigmoid. Recommended 5 year surveillance   Medications: Reviewed & Updated - see associated section   Current outpatient prescriptions:  . ALPRAZolam (XANAX) 0.5 MG tablet, Take 1 tablet (0.5 mg total) by mouth 3 (three) times daily as needed for sleep or anxiety., Disp: 90 tablet, Rfl: 2 . aspirin 81 MG tablet, Take 81 mg by mouth every morning. , Disp: , Rfl:  . atorvastatin (LIPITOR) 10 MG tablet, Take 1 tablet  by mouth daily., Disp: , Rfl:  . buPROPion (WELLBUTRIN XL) 150 MG 24 hr tablet, Take 2 tablets (300 mg total) by mouth every morning., Disp: 60 tablet, Rfl: 2 . Cholecalciferol (VITAMIN D PO), Take 5,000 Units by mouth daily., Disp: , Rfl:  . Cyanocobalamin (VITAMIN B-12 PO), Take by mouth daily., Disp: , Rfl:  . dexlansoprazole (DEXILANT) 60 MG capsule, Take 1 capsule (60 mg total) by mouth daily.,  Disp: 30 capsule, Rfl: 11 . DULoxetine (CYMBALTA) 60 MG capsule, Take 1 capsule (60 mg total) by mouth daily., Disp: 30 capsule, Rfl: 2 . fexofenadine (ALLEGRA) 180 MG tablet, Take 180 mg by mouth every morning. , Disp: , Rfl:  . furosemide (LASIX) 20 MG tablet, Take 20 mg by mouth 2 (two) times daily. , Disp: , Rfl:  . ibuprofen (ADVIL,MOTRIN) 800 MG tablet, Take 800 mg by mouth as needed for headache., Disp: , Rfl:  . lidocaine (LIDODERM) 5 %, Place 1 patch onto the skin every 12 (twelve) hours as needed (for pain). , Disp: , Rfl:  . Liraglutide (VICTOZA) 18 MG/3ML SOPN, Inject 1.8 mg into the skin every evening. , Disp: , Rfl:  . LYRICA 75 MG capsule, Take 75 mg by mouth 2 (two) times daily. , Disp: , Rfl:  . metFORMIN (GLUCOPHAGE-XR) 500 MG 24 hr tablet, Take 500 mg by mouth daily., Disp: , Rfl:  . metoprolol (LOPRESSOR) 100 MG tablet, Take 100 mg by mouth 2 (two) times daily. , Disp: , Rfl:  . Multiple Vitamin (MULTIVITAMIN) capsule, Take 1 capsule by mouth daily. , Disp: , Rfl:  . ONE TOUCH ULTRA TEST test strip, , Disp: , Rfl:  . oxycodone (ROXICODONE) 30 MG immediate release tablet, Take 30 mg by mouth every 6 (six) hours as needed. For pain, Disp: , Rfl:  . pantoprazole (PROTONIX) 40 MG tablet, TAKE (1) TABLET BY MOUTH ONCE DAILY., Disp: 30 tablet, Rfl: 2 . QC PEN NEEDLES 31G X 6 MM MISC, , Disp: , Rfl:  . valsartan-hydrochlorothiazide (DIOVAN-HCT) 160-25 MG per tablet, Take 1 tablet by mouth daily., Disp: , Rfl:  . VOLTAREN 1 % GEL, Apply 2 g topically daily as needed (pain). , Disp: , Rfl:  . zolpidem (AMBIEN CR) 12.5 MG CR tablet, Take 1 tablet (12.5 mg total) by mouth at bedtime as needed for sleep., Disp: 30 tablet, Rfl: 2   Social History: Reviewed -  reports that she quit smoking about 6 months ago. Her smoking use included Cigarettes. She has a 1 pack-year smoking history. She has never used smokeless tobacco.  Objective Findings:  Vitals:  Blood pressure 132/88, pulse 80, height 5\' 11"  (1.803 m), weight 210 lb (95.255 kg).  Physical Examination: General appearance - alert, well appearing, and in no distress Mental status - alert, oriented to person, place, and time  Discussed with pt results of non-OB transvaginal US and risks and benefits of surgical options for left ovarian adhesions. At end of discussion, pt had opportunity to ask questions and has no further questions at this time. Greater than 50% was spent in counseling and coordination of care with the patient. Total time greater than: 25 minutes    Assessment & Plan:   A:  1. Left adnexal mass on exam on 11/07/15  2. Transvaginal US on 11/17/15 showed left ovarian adhesions  3. Left ovarian pelvic pain due to left ovary adhesions 4. Pt reports 2 years of vasomotor symptoms.  P:  1. Discussed surgical options. 2. Pt requests consideration of BSO.  By signing my name below, I, Doreatha Martin, attest that this documentation has been prepared under the direction and in the presence of Tilda Burrow, MD. Electronically Signed: Doreatha Martin, ED Scribe. 11/17/2015. 10:20 AM.  I personally performed the services described in this documentation, which was SCRIBED in my presence. The recorded information has been reviewed and considered accurate. It has been edited as necessary during review. Tilda Burrow, MD

## 2015-11-22 ENCOUNTER — Encounter (HOSPITAL_COMMUNITY)
Admission: RE | Disposition: A | Discharge status: Home / Self Care | Payer: Self-pay | Source: Ambulatory Visit | Attending: Obstetrics and Gynecology

## 2015-11-22 ENCOUNTER — Encounter (HOSPITAL_COMMUNITY): Payer: Self-pay | Admitting: *Deleted

## 2015-11-22 ENCOUNTER — Ambulatory Visit (HOSPITAL_COMMUNITY): Payer: Medicare Other | Admitting: Anesthesiology

## 2015-11-22 ENCOUNTER — Observation Stay (HOSPITAL_COMMUNITY)
Admission: RE | Admit: 2015-11-22 | Discharge: 2015-11-23 | Disposition: A | Discharge status: Home / Self Care | Payer: Medicare Other | Source: Ambulatory Visit | Attending: Obstetrics and Gynecology | Admitting: Obstetrics and Gynecology

## 2015-11-22 DIAGNOSIS — L720 Epidermal cyst: Secondary | ICD-10-CM | POA: Diagnosis not present

## 2015-11-22 DIAGNOSIS — I10 Essential (primary) hypertension: Secondary | ICD-10-CM | POA: Diagnosis not present

## 2015-11-22 DIAGNOSIS — Z7984 Long term (current) use of oral hypoglycemic drugs: Secondary | ICD-10-CM | POA: Insufficient documentation

## 2015-11-22 DIAGNOSIS — N8312 Corpus luteum cyst of left ovary: Secondary | ICD-10-CM

## 2015-11-22 DIAGNOSIS — G8929 Other chronic pain: Secondary | ICD-10-CM | POA: Diagnosis present

## 2015-11-22 DIAGNOSIS — Z90710 Acquired absence of both cervix and uterus: Secondary | ICD-10-CM | POA: Insufficient documentation

## 2015-11-22 DIAGNOSIS — K219 Gastro-esophageal reflux disease without esophagitis: Secondary | ICD-10-CM | POA: Diagnosis not present

## 2015-11-22 DIAGNOSIS — F329 Major depressive disorder, single episode, unspecified: Secondary | ICD-10-CM | POA: Insufficient documentation

## 2015-11-22 DIAGNOSIS — N736 Female pelvic peritoneal adhesions (postinfective): Principal | ICD-10-CM | POA: Diagnosis present

## 2015-11-22 DIAGNOSIS — E119 Type 2 diabetes mellitus without complications: Secondary | ICD-10-CM | POA: Diagnosis not present

## 2015-11-22 DIAGNOSIS — Z7982 Long term (current) use of aspirin: Secondary | ICD-10-CM | POA: Insufficient documentation

## 2015-11-22 DIAGNOSIS — N994 Postprocedural pelvic peritoneal adhesions: Secondary | ICD-10-CM

## 2015-11-22 DIAGNOSIS — Z87891 Personal history of nicotine dependence: Secondary | ICD-10-CM | POA: Insufficient documentation

## 2015-11-22 DIAGNOSIS — R102 Pelvic and perineal pain: Secondary | ICD-10-CM | POA: Diagnosis not present

## 2015-11-22 DIAGNOSIS — N8311 Corpus luteum cyst of right ovary: Secondary | ICD-10-CM | POA: Diagnosis not present

## 2015-11-22 HISTORY — PX: LAPAROSCOPIC BILATERAL SALPINGO OOPHERECTOMY: SHX5890

## 2015-11-22 LAB — GLUCOSE, CAPILLARY
GLUCOSE-CAPILLARY: 137 mg/dL — AB (ref 65–99)
GLUCOSE-CAPILLARY: 182 mg/dL — AB (ref 65–99)
Glucose-Capillary: 247 mg/dL — ABNORMAL HIGH (ref 65–99)
Glucose-Capillary: 227 mg/dL — ABNORMAL HIGH (ref 65–99)
Glucose-Capillary: 289 mg/dL — ABNORMAL HIGH (ref 65–99)
Glucose-Capillary: 323 mg/dL — ABNORMAL HIGH (ref 65–99)

## 2015-11-22 SURGERY — SALPINGO-OOPHORECTOMY, BILATERAL, LAPAROSCOPIC
Anesthesia: General | Laterality: Bilateral

## 2015-11-22 MED ORDER — ONDANSETRON HCL 4 MG/2ML IJ SOLN
4.0000 mg | Freq: Once | INTRAMUSCULAR | Status: AC
  Administered 2015-11-22: 4 mg via INTRAVENOUS

## 2015-11-22 MED ORDER — PREGABALIN 50 MG PO CAPS
75.0000 mg | ORAL_CAPSULE | Freq: Two times a day (BID) | ORAL | Status: DC
  Administered 2015-11-22 – 2015-11-23 (×2): 75 mg via ORAL
  Filled 2015-11-22 (×2): qty 1

## 2015-11-22 MED ORDER — INSULIN ASPART 100 UNIT/ML ~~LOC~~ SOLN: 0.0000 [IU] | Freq: Every day | SUBCUTANEOUS | Status: DC

## 2015-11-22 MED ORDER — INSULIN ASPART 100 UNIT/ML ~~LOC~~ SOLN
0.0000 [IU] | Freq: Three times a day (TID) | SUBCUTANEOUS | Status: DC
  Administered 2015-11-22: 11 [IU] via SUBCUTANEOUS

## 2015-11-22 MED ORDER — KETOROLAC TROMETHAMINE 30 MG/ML IJ SOLN
30.0000 mg | Freq: Four times a day (QID) | INTRAMUSCULAR | Status: DC
  Filled 2015-11-22: qty 1

## 2015-11-22 MED ORDER — VALSARTAN-HYDROCHLOROTHIAZIDE 160-25 MG PO TABS: 1.0000 | ORAL_TABLET | Freq: Every day | ORAL | Status: DC

## 2015-11-22 MED ORDER — LIDOCAINE HCL (PF) 1 % IJ SOLN
INTRAMUSCULAR | Status: AC
  Filled 2015-11-22: qty 5

## 2015-11-22 MED ORDER — HYDROMORPHONE HCL 1 MG/ML IJ SOLN
1.0000 mg | INTRAMUSCULAR | Status: DC | PRN
  Administered 2015-11-22 (×3): 1 mg via INTRAVENOUS
  Filled 2015-11-22 (×3): qty 1

## 2015-11-22 MED ORDER — PANTOPRAZOLE SODIUM 40 MG PO TBEC: 40.0000 mg | DELAYED_RELEASE_TABLET | Freq: Every day | ORAL | Status: DC

## 2015-11-22 MED ORDER — KETOROLAC TROMETHAMINE 30 MG/ML IJ SOLN
INTRAMUSCULAR | Status: AC
  Filled 2015-11-22: qty 1

## 2015-11-22 MED ORDER — EPHEDRINE SULFATE 50 MG/ML IJ SOLN
INTRAMUSCULAR | Status: AC
  Filled 2015-11-22: qty 1

## 2015-11-22 MED ORDER — ONDANSETRON HCL 4 MG PO TABS: 4.0000 mg | ORAL_TABLET | Freq: Four times a day (QID) | ORAL | Status: DC | PRN

## 2015-11-22 MED ORDER — ONDANSETRON HCL 4 MG/2ML IJ SOLN
4.0000 mg | Freq: Four times a day (QID) | INTRAMUSCULAR | Status: DC | PRN
  Administered 2015-11-22: 4 mg via INTRAVENOUS
  Filled 2015-11-22: qty 2

## 2015-11-22 MED ORDER — FENTANYL CITRATE (PF) 100 MCG/2ML IJ SOLN
25.0000 ug | INTRAMUSCULAR | Status: DC | PRN
  Administered 2015-11-22 (×4): 50 ug via INTRAVENOUS
  Filled 2015-11-22 (×2): qty 2

## 2015-11-22 MED ORDER — ALPRAZOLAM 0.5 MG PO TABS
0.5000 mg | ORAL_TABLET | Freq: Three times a day (TID) | ORAL | Status: DC | PRN
  Administered 2015-11-22 – 2015-11-23 (×2): 0.5 mg via ORAL
  Filled 2015-11-22 (×2): qty 1

## 2015-11-22 MED ORDER — PROPOFOL 10 MG/ML IV BOLUS
INTRAVENOUS | Status: AC
  Filled 2015-11-22: qty 20

## 2015-11-22 MED ORDER — METFORMIN HCL ER 500 MG PO TB24
500.0000 mg | ORAL_TABLET | Freq: Every day | ORAL | Status: DC
  Administered 2015-11-23: 500 mg via ORAL
  Filled 2015-11-22: qty 1

## 2015-11-22 MED ORDER — BUPIVACAINE HCL (PF) 0.5 % IJ SOLN
INTRAMUSCULAR | Status: DC | PRN
  Administered 2015-11-22: 10 mL

## 2015-11-22 MED ORDER — ONDANSETRON HCL 4 MG/2ML IJ SOLN: 4.0000 mg | Freq: Once | INTRAMUSCULAR | Status: DC | PRN

## 2015-11-22 MED ORDER — FENTANYL CITRATE (PF) 250 MCG/5ML IJ SOLN
INTRAMUSCULAR | Status: DC | PRN
  Administered 2015-11-22 (×5): 50 ug via INTRAVENOUS

## 2015-11-22 MED ORDER — CEFAZOLIN SODIUM-DEXTROSE 2-3 GM-% IV SOLR
INTRAVENOUS | Status: AC
  Filled 2015-11-22: qty 50

## 2015-11-22 MED ORDER — IRBESARTAN 150 MG PO TABS
150.0000 mg | ORAL_TABLET | Freq: Every day | ORAL | Status: DC
  Administered 2015-11-22 – 2015-11-23 (×2): 150 mg via ORAL
  Filled 2015-11-22 (×2): qty 1

## 2015-11-22 MED ORDER — BUPIVACAINE HCL (PF) 0.5 % IJ SOLN
INTRAMUSCULAR | Status: AC
Start: 2015-11-22 — End: 2015-11-22
  Filled 2015-11-22: qty 30

## 2015-11-22 MED ORDER — SUCCINYLCHOLINE CHLORIDE 20 MG/ML IJ SOLN
INTRAMUSCULAR | Status: DC | PRN
  Administered 2015-11-22: 110 mg via INTRAVENOUS

## 2015-11-22 MED ORDER — SCOPOLAMINE 1 MG/3DAYS TD PT72
1.0000 | MEDICATED_PATCH | Freq: Once | TRANSDERMAL | Status: DC
  Administered 2015-11-22: 1.5 mg via TRANSDERMAL

## 2015-11-22 MED ORDER — HYDROCHLOROTHIAZIDE 25 MG PO TABS
25.0000 mg | ORAL_TABLET | Freq: Every day | ORAL | Status: DC
  Administered 2015-11-22 – 2015-11-23 (×2): 25 mg via ORAL
  Filled 2015-11-22 (×2): qty 1

## 2015-11-22 MED ORDER — LACTATED RINGERS IV SOLN
INTRAVENOUS | Status: DC
  Administered 2015-11-22: 09:00:00 via INTRAVENOUS
  Administered 2015-11-22: 1000 mL via INTRAVENOUS

## 2015-11-22 MED ORDER — ALBUTEROL SULFATE (2.5 MG/3ML) 0.083% IN NEBU
INHALATION_SOLUTION | RESPIRATORY_TRACT | Status: AC
  Filled 2015-11-22: qty 3

## 2015-11-22 MED ORDER — OXYCODONE-ACETAMINOPHEN 5-325 MG PO TABS
1.0000 | ORAL_TABLET | ORAL | Status: DC | PRN
  Administered 2015-11-23: 2 via ORAL
  Administered 2015-11-23: 1 via ORAL
  Filled 2015-11-22: qty 1
  Filled 2015-11-22: qty 2

## 2015-11-22 MED ORDER — SCOPOLAMINE 1 MG/3DAYS TD PT72
MEDICATED_PATCH | TRANSDERMAL | Status: AC
  Filled 2015-11-22: qty 1

## 2015-11-22 MED ORDER — ONDANSETRON HCL 4 MG/2ML IJ SOLN
INTRAMUSCULAR | Status: AC
  Filled 2015-11-22: qty 2

## 2015-11-22 MED ORDER — CEFAZOLIN SODIUM-DEXTROSE 2-3 GM-% IV SOLR
2.0000 g | INTRAVENOUS | Status: AC
  Administered 2015-11-22: 2 g via INTRAVENOUS

## 2015-11-22 MED ORDER — SODIUM CHLORIDE 0.9 % IN NEBU
INHALATION_SOLUTION | RESPIRATORY_TRACT | Status: AC
  Filled 2015-11-22: qty 3

## 2015-11-22 MED ORDER — PANTOPRAZOLE SODIUM 40 MG PO TBEC
40.0000 mg | DELAYED_RELEASE_TABLET | Freq: Every day | ORAL | Status: DC
Start: 2015-11-22 — End: 2015-11-23
  Administered 2015-11-22 – 2015-11-23 (×2): 40 mg via ORAL
  Filled 2015-11-22 (×2): qty 1

## 2015-11-22 MED ORDER — MIDAZOLAM HCL 2 MG/2ML IJ SOLN
1.0000 mg | INTRAMUSCULAR | Status: DC | PRN
  Administered 2015-11-22: 2 mg via INTRAVENOUS

## 2015-11-22 MED ORDER — IBUPROFEN 600 MG PO TABS: 600.0000 mg | ORAL_TABLET | Freq: Four times a day (QID) | ORAL | Status: DC | PRN

## 2015-11-22 MED ORDER — ROCURONIUM BROMIDE 100 MG/10ML IV SOLN
INTRAVENOUS | Status: DC | PRN
  Administered 2015-11-22 (×3): 5 mg via INTRAVENOUS
  Administered 2015-11-22: 25 mg via INTRAVENOUS

## 2015-11-22 MED ORDER — LIDOCAINE HCL (CARDIAC) 20 MG/ML IV SOLN
INTRAVENOUS | Status: DC | PRN
  Administered 2015-11-22: 40 mg via INTRATRACHEAL

## 2015-11-22 MED ORDER — ROCURONIUM BROMIDE 50 MG/5ML IV SOLN
INTRAVENOUS | Status: AC
  Filled 2015-11-22: qty 2

## 2015-11-22 MED ORDER — ALBUTEROL SULFATE (2.5 MG/3ML) 0.083% IN NEBU
2.5000 mg | INHALATION_SOLUTION | Freq: Once | RESPIRATORY_TRACT | Status: AC
  Administered 2015-11-22: 2.5 mg via RESPIRATORY_TRACT

## 2015-11-22 MED ORDER — GLYCOPYRROLATE 0.2 MG/ML IJ SOLN
INTRAMUSCULAR | Status: AC
  Filled 2015-11-22: qty 5

## 2015-11-22 MED ORDER — FUROSEMIDE 20 MG PO TABS
20.0000 mg | ORAL_TABLET | Freq: Two times a day (BID) | ORAL | Status: DC
  Administered 2015-11-22 – 2015-11-23 (×2): 20 mg via ORAL
  Filled 2015-11-22 (×2): qty 1

## 2015-11-22 MED ORDER — SODIUM CHLORIDE 0.9 % IV SOLN
INTRAVENOUS | Status: DC
  Administered 2015-11-22 (×2): via INTRAVENOUS

## 2015-11-22 MED ORDER — PROPOFOL 10 MG/ML IV BOLUS
INTRAVENOUS | Status: DC | PRN
  Administered 2015-11-22: 150 mg via INTRAVENOUS

## 2015-11-22 MED ORDER — KETOROLAC TROMETHAMINE 30 MG/ML IJ SOLN
30.0000 mg | Freq: Four times a day (QID) | INTRAMUSCULAR | Status: DC
  Administered 2015-11-22 – 2015-11-23 (×4): 30 mg via INTRAVENOUS
  Filled 2015-11-22 (×3): qty 1

## 2015-11-22 MED ORDER — FENTANYL CITRATE (PF) 250 MCG/5ML IJ SOLN
INTRAMUSCULAR | Status: AC
  Filled 2015-11-22: qty 5

## 2015-11-22 MED ORDER — PROMETHAZINE HCL 25 MG/ML IJ SOLN: 12.5000 mg | INTRAMUSCULAR | Status: DC | PRN

## 2015-11-22 MED ORDER — NEOSTIGMINE METHYLSULFATE 10 MG/10ML IV SOLN
INTRAVENOUS | Status: DC | PRN
  Administered 2015-11-22: 3 mg via INTRAVENOUS

## 2015-11-22 MED ORDER — SODIUM CHLORIDE 0.9 % IR SOLN
Status: DC | PRN
  Administered 2015-11-22: 3000 mL

## 2015-11-22 MED ORDER — NEOSTIGMINE METHYLSULFATE 10 MG/10ML IV SOLN
INTRAVENOUS | Status: AC
Start: 2015-11-22 — End: 2015-11-22
  Filled 2015-11-22: qty 1

## 2015-11-22 MED ORDER — SUCCINYLCHOLINE CHLORIDE 20 MG/ML IJ SOLN
INTRAMUSCULAR | Status: AC
  Filled 2015-11-22: qty 1

## 2015-11-22 MED ORDER — DEXAMETHASONE SODIUM PHOSPHATE 4 MG/ML IJ SOLN
INTRAMUSCULAR | Status: AC
  Filled 2015-11-22: qty 1

## 2015-11-22 MED ORDER — DULOXETINE HCL 60 MG PO CPEP
60.0000 mg | ORAL_CAPSULE | Freq: Every day | ORAL | Status: DC
  Administered 2015-11-23: 60 mg via ORAL
  Filled 2015-11-22: qty 1

## 2015-11-22 MED ORDER — KETOROLAC TROMETHAMINE 30 MG/ML IJ SOLN
30.0000 mg | Freq: Once | INTRAMUSCULAR | Status: AC
  Administered 2015-11-22: 30 mg via INTRAVENOUS

## 2015-11-22 MED ORDER — METOPROLOL TARTRATE 50 MG PO TABS
100.0000 mg | ORAL_TABLET | Freq: Two times a day (BID) | ORAL | Status: DC
  Administered 2015-11-22 – 2015-11-23 (×3): 100 mg via ORAL
  Filled 2015-11-22: qty 1
  Filled 2015-11-22 (×7): qty 2
  Filled 2015-11-22 (×2): qty 1

## 2015-11-22 MED ORDER — GLYCOPYRROLATE 0.2 MG/ML IJ SOLN
INTRAMUSCULAR | Status: DC | PRN
  Administered 2015-11-22: .5 mg via INTRAVENOUS

## 2015-11-22 MED ORDER — MIDAZOLAM HCL 2 MG/2ML IJ SOLN
INTRAMUSCULAR | Status: AC
  Filled 2015-11-22: qty 2

## 2015-11-22 MED ORDER — DEXAMETHASONE SODIUM PHOSPHATE 4 MG/ML IJ SOLN
4.0000 mg | Freq: Once | INTRAMUSCULAR | Status: AC
Start: 2015-11-22 — End: 2015-11-22
  Administered 2015-11-22: 4 mg via INTRAVENOUS

## 2015-11-22 SURGICAL SUPPLY — 52 items
BAG HAMPER (MISCELLANEOUS) ×3 IMPLANT
BANDAGE STRIP 1X3 FLEXIBLE (GAUZE/BANDAGES/DRESSINGS) ×9 IMPLANT
BANDAID FLEXIBLE 1X3 (GAUZE/BANDAGES/DRESSINGS) ×15 IMPLANT
BLADE SURG SZ11 CARB STEEL (BLADE) ×3 IMPLANT
CLOSURE WOUND 1/4 X3 (GAUZE/BANDAGES/DRESSINGS) ×1
CLOTH BEACON ORANGE TIMEOUT ST (SAFETY) ×3 IMPLANT
COVER LIGHT HANDLE STERIS (MISCELLANEOUS) ×6 IMPLANT
DECANTER SPIKE VIAL GLASS SM (MISCELLANEOUS) ×3 IMPLANT
DRSG TEGADERM 2-3/8X2-3/4 SM (GAUZE/BANDAGES/DRESSINGS) ×6 IMPLANT
DURAPREP 26ML APPLICATOR (WOUND CARE) ×3 IMPLANT
ELECT REM PT RETURN 9FT ADLT (ELECTROSURGICAL) ×3
ELECTRODE REM PT RTRN 9FT ADLT (ELECTROSURGICAL) ×1 IMPLANT
FILTER SMOKE EVAC LAPAROSHD (FILTER) ×3 IMPLANT
FORMALIN 10 PREFIL 120ML (MISCELLANEOUS) ×3 IMPLANT
FORMALIN 10 PREFIL 480ML (MISCELLANEOUS) IMPLANT
GLOVE BIOGEL PI IND STRL 7.0 (GLOVE) ×2 IMPLANT
GLOVE BIOGEL PI IND STRL 7.5 (GLOVE) ×2 IMPLANT
GLOVE BIOGEL PI IND STRL 9 (GLOVE) ×1 IMPLANT
GLOVE BIOGEL PI INDICATOR 7.0 (GLOVE) ×4
GLOVE BIOGEL PI INDICATOR 7.5 (GLOVE) ×4
GLOVE BIOGEL PI INDICATOR 9 (GLOVE) ×2
GLOVE ECLIPSE 6.5 STRL STRAW (GLOVE) ×6 IMPLANT
GLOVE ECLIPSE 9.0 STRL (GLOVE) ×3 IMPLANT
GOWN SPEC L3 XXLG W/TWL (GOWN DISPOSABLE) ×6 IMPLANT
GOWN STRL REUS W/TWL LRG LVL3 (GOWN DISPOSABLE) ×3 IMPLANT
INST SET LAPROSCOPIC GYN AP (KITS) ×3 IMPLANT
IV NS IRRIG 3000ML ARTHROMATIC (IV SOLUTION) ×3 IMPLANT
KIT ROOM TURNOVER APOR (KITS) ×3 IMPLANT
LIGASURE 5MM LAPAROSCOPIC (INSTRUMENTS) IMPLANT
MANIFOLD NEPTUNE II (INSTRUMENTS) ×3 IMPLANT
NEEDLE HYPO 25X1 1.5 SAFETY (NEEDLE) ×3 IMPLANT
NEEDLE INSUFFLATION 14GA 120MM (NEEDLE) ×3 IMPLANT
NS IRRIG 1000ML POUR BTL (IV SOLUTION) ×3 IMPLANT
PACK PERI GYN (CUSTOM PROCEDURE TRAY) ×3 IMPLANT
PAD ARMBOARD 7.5X6 YLW CONV (MISCELLANEOUS) ×3 IMPLANT
SCALPEL HARMONIC ACE (MISCELLANEOUS) ×3 IMPLANT
SET BASIN LINEN APH (SET/KITS/TRAYS/PACK) IMPLANT
SET TUBE IRRIG SUCTION NO TIP (IRRIGATION / IRRIGATOR) ×3 IMPLANT
SLEEVE ENDOPATH XCEL 5M (ENDOMECHANICALS) ×3 IMPLANT
SOLUTION ANTI FOG 6CC (MISCELLANEOUS) ×3 IMPLANT
SPONGE GAUZE 2X2 8PLY STER LF (GAUZE/BANDAGES/DRESSINGS) ×2
SPONGE GAUZE 2X2 8PLY STRL LF (GAUZE/BANDAGES/DRESSINGS) ×4 IMPLANT
STRIP CLOSURE SKIN 1/4X3 (GAUZE/BANDAGES/DRESSINGS) ×2 IMPLANT
SUT VIC AB 4-0 PS2 27 (SUTURE) ×6 IMPLANT
SUT VICRYL 0 UR6 27IN ABS (SUTURE) ×6 IMPLANT
SYR BULB IRRIGATION 50ML (SYRINGE) ×3 IMPLANT
SYRINGE 10CC LL (SYRINGE) ×6 IMPLANT
TRAY FOLEY CATH SILVER 16FR (SET/KITS/TRAYS/PACK) ×3 IMPLANT
TROCAR XCEL NON-BLD 5MMX100MML (ENDOMECHANICALS) ×3 IMPLANT
TROCAR XCEL UNIV SLVE 11M 100M (ENDOMECHANICALS) ×3 IMPLANT
TUBING INSUFFLATION (TUBING) ×3 IMPLANT
WARMER LAPAROSCOPE (MISCELLANEOUS) ×3 IMPLANT

## 2015-11-22 NOTE — H&P (View-Only) (Signed)
Progress Notes Info    Chartered loss adjusterAuthor Note Status Last Update User Last Update Date/Time   Tilda BurrowJohn Natacia Chaisson V, MD Signed Tilda BurrowJohn Remas Sobel V, MD 11/17/2015 12:53 PM    Progress Notes    Expand All Collapse All   Patient ID: [name], female DOB: [birth year], 50 y.o. MRN: [id number]  [facility]  Patient name: Cynthia PamFelice A BrimMRN [id number] Date of birth: [birth year]  CC & HPI:  [name] is a 50 y.o. female with h/o total abdominal hysterectomy, breast reduction surgery, DM, HTN presenting today for follow up for results of transvaginal non-OB US today. Pt was seen in the office on 11/07/15 and physical exam showed a small, firm, tender, somewhat mobile mass in left adx close to the cuff. Pt reports significant pain on the left side during US today. She notes her pain has been worsening and constant since onset whick was over a year ago, and notable with coughing or valsalve, as well as dyspareunia. Pt also reports 2 years of vasomotor symptoms.  Surgical options of left ovary and tube removal discussed; pt requests that both ovaries and tubes be removed. Pt highly in favor of proceeding surgically. Risks , rationale, alternatives to bilateral salpingo oophorectomy discussed.  ROS:  A complete 10 system review of systems was obtained and all systems are negative except as noted in the HPI and PMH.   Pertinent History Reviewed:  Reviewed: Significant for total abdominal hysterectomy, breast reduction surgery, DM, HTN Medical  Past Medical History  Diagnosis Date  . Depression   . Diabetes mellitus   . LBP (low back pain)   . BMI (body mass index) 20.0-29.9 2008 192 lbs  . GERD (gastroesophageal reflux disease) 2002    202 lbs  . Irritable bowel syndrome 2008    diarrhea predominant MEDS TRIED: BENTYL, LEVBID  . RUQ abdominal pain 2004 HIDA NL CTA w/ IVC-FATTY LIVER, ANGIOMYOLIPOMA LEFT KIDNEY    2008 HIDA NL  .  PTSD (post-traumatic stress disorder)   . Hypertension   . Complication of anesthesia     post op N/V  . PONV (postoperative nausea and vomiting)     Surgical Hx:  Past Surgical History  Procedure Laterality Date  . Total abdominal hysterectomy  2000 fibroids/DUB  . Shoulder surgery  1997 RIGHT  . Breast reduction surgery  2006  . Lumbar disc surgery  2005  . Ankle surgery  RIGHT  . Colonoscopy  2008 RMR    PAN-COLONIC TICS, NL TI  . Upper gastrointestinal endoscopy  2008 RMR    SML HH o/w NL  . Esophageal dilation N/A 04/06/2015    Procedure: ESOPHAGEAL DILATION; Surgeon: Corbin Adeobert M Rourk, MD; Location: [facility]; Service: Gastroenterology; Laterality: N/A; 300  . Esophagogastroduodenoscopy N/A 04/06/2015    RMR: Normal EGD status post Maloney dilation of teh esophagus as described above  . Vocal cord polyp removal  2013    Dr. Josephina ShihEwain Wilson  . Colonoscopy  2013    Dr. Teena DunkBenson: diverticulum in ascending colon, mild diverticulosis in sigmoid. Recommended 5 year surveillance   Medications: Reviewed & Updated - see associated section   Current outpatient prescriptions:  . ALPRAZolam (XANAX) 0.5 MG tablet, Take 1 tablet (0.5 mg total) by mouth 3 (three) times daily as needed for sleep or anxiety., Disp: 90 tablet, Rfl: 2 . aspirin 81 MG tablet, Take 81 mg by mouth every morning. , Disp: , Rfl:  . atorvastatin (LIPITOR) 10 MG tablet, Take 1 tablet  by mouth daily., Disp: , Rfl:  . buPROPion (WELLBUTRIN XL) 150 MG 24 hr tablet, Take 2 tablets (300 mg total) by mouth every morning., Disp: 60 tablet, Rfl: 2 . Cholecalciferol (VITAMIN D PO), Take 5,000 Units by mouth daily., Disp: , Rfl:  . Cyanocobalamin (VITAMIN B-12 PO), Take by mouth daily., Disp: , Rfl:  . dexlansoprazole (DEXILANT) 60 MG capsule, Take 1 capsule (60 mg total) by mouth daily.,  Disp: 30 capsule, Rfl: 11 . DULoxetine (CYMBALTA) 60 MG capsule, Take 1 capsule (60 mg total) by mouth daily., Disp: 30 capsule, Rfl: 2 . fexofenadine (ALLEGRA) 180 MG tablet, Take 180 mg by mouth every morning. , Disp: , Rfl:  . furosemide (LASIX) 20 MG tablet, Take 20 mg by mouth 2 (two) times daily. , Disp: , Rfl:  . ibuprofen (ADVIL,MOTRIN) 800 MG tablet, Take 800 mg by mouth as needed for headache., Disp: , Rfl:  . lidocaine (LIDODERM) 5 %, Place 1 patch onto the skin every 12 (twelve) hours as needed (for pain). , Disp: , Rfl:  . Liraglutide (VICTOZA) 18 MG/3ML SOPN, Inject 1.8 mg into the skin every evening. , Disp: , Rfl:  . LYRICA 75 MG capsule, Take 75 mg by mouth 2 (two) times daily. , Disp: , Rfl:  . metFORMIN (GLUCOPHAGE-XR) 500 MG 24 hr tablet, Take 500 mg by mouth daily., Disp: , Rfl:  . metoprolol (LOPRESSOR) 100 MG tablet, Take 100 mg by mouth 2 (two) times daily. , Disp: , Rfl:  . Multiple Vitamin (MULTIVITAMIN) capsule, Take 1 capsule by mouth daily. , Disp: , Rfl:  . ONE TOUCH ULTRA TEST test strip, , Disp: , Rfl:  . oxycodone (ROXICODONE) 30 MG immediate release tablet, Take 30 mg by mouth every 6 (six) hours as needed. For pain, Disp: , Rfl:  . pantoprazole (PROTONIX) 40 MG tablet, TAKE (1) TABLET BY MOUTH ONCE DAILY., Disp: 30 tablet, Rfl: 2 . QC PEN NEEDLES 31G X 6 MM MISC, , Disp: , Rfl:  . valsartan-hydrochlorothiazide (DIOVAN-HCT) 160-25 MG per tablet, Take 1 tablet by mouth daily., Disp: , Rfl:  . VOLTAREN 1 % GEL, Apply 2 g topically daily as needed (pain). , Disp: , Rfl:  . zolpidem (AMBIEN CR) 12.5 MG CR tablet, Take 1 tablet (12.5 mg total) by mouth at bedtime as needed for sleep., Disp: 30 tablet, Rfl: 2   Social History: Reviewed -  reports that she quit smoking about 6 months ago. Her smoking use included Cigarettes. She has a 1 pack-year smoking history. She has never used smokeless tobacco.  Objective Findings:  Vitals:  Blood pressure 132/88, pulse 80, height 5\' 11"  (1.803 m), weight 210 lb (95.255 kg).  Physical Examination: General appearance - alert, well appearing, and in no distress Mental status - alert, oriented to person, place, and time  Discussed with pt results of non-OB transvaginal US and risks and benefits of surgical options for left ovarian adhesions. At end of discussion, pt had opportunity to ask questions and has no further questions at this time. Greater than 50% was spent in counseling and coordination of care with the patient. Total time greater than: 25 minutes    Assessment & Plan:   A:  1. Left adnexal mass on exam on 11/07/15  2. Transvaginal US on 11/17/15 showed left ovarian adhesions  3. Left ovarian pelvic pain due to left ovary adhesions 4. Pt reports 2 years of vasomotor symptoms.  P:  1. Discussed surgical options. 2. Pt requests consideration of BSO.  By signing my name below, I, Doreatha Martin, attest that this documentation has been prepared under the direction and in the presence of Tilda Burrow, MD. Electronically Signed: Doreatha Martin, ED Scribe. 11/17/2015. 10:20 AM.  I personally performed the services described in this documentation, which was SCRIBED in my presence. The recorded information has been reviewed and considered accurate. It has been edited as necessary during review. Tilda Burrow, MD

## 2015-11-22 NOTE — Brief Op Note (Signed)
11/22/2015  10:04 AM  PATIENT:  [name]  50 y.o. female  PRE-OPERATIVE DIAGNOSIS:  left ovarian adhesions   POST-OPERATIVE DIAGNOSIS:  extensive abdominal and pelvic adhesions, postsurgical                                                         Bilateral ovarian adhesions  PROCEDURE:  Procedure(s): LAPAROSCOPIC BILATERAL SALPINGO OOPHORECTOMY (Bilateral), lysis of abdominal omental and pelvic adhesions  SURGEON:  Surgeon(s) and Role:    * Tilda Burrow, MD - Primary  PHYSICIAN ASSISTANT:   ASSISTANTS: none Marya Landry CST FA  ANESTHESIA:   local and general  EBL:  Total I/O In: 1000 [I.V.:1000] Out: 275 [Urine:250; Blood:25]  BLOOD ADMINISTERED:none  DRAINS: none   LOCAL MEDICATIONS USED:  MARCAINE    and Amount: 10 ml  SPECIMEN:  Source of Specimen:  Bilateral tubes and ovaries  DISPOSITION OF SPECIMEN:  PATHOLOGY  COUNTS:  YES  TOURNIQUET:  * No tourniquets in log *  DICTATION: .Dragon Dictation  PLAN OF CARE: Admit for overnight observation  PATIENT DISPOSITION:  PACU - hemodynamically stable.   Delay start of Pharmacological VTE agent (>24hrs) due to surgical blood loss or risk of bleeding: not applicable Indications 50 year old female with chronic pelvic pain left side predominant reproducible with transvaginal ultrasound for the left ovary is found to be immobile on the left pelvic sidewall and uncomfortable on contact by ultrasound probe. Findings included extensive thin omental adhesions to the anterior abdominal wall at site of prior laparotomy all the way to the umbilicus, as well as the only adhesions in the pelvis with the sigmoid colon hiding the left ovary until restoration of normal anatomy. Details of's of procedure: Patient was taken operating room prepped and draped for lower abdominal and pelvic combined surgery with legs in lithotomy support. The patient was positioned prior to the induction of anesthesia in the case in order to  address her back discomfort. She was placed in a comfortable position and stayed in that position throughout the case. The abdomen prepped and draped, Ancef 2 g was administered and the surgical procedure confirmed by surgical team. An infraumbilical vertical one centimeters skin incision was made as well as a transverse suprapubic 2 cm skin incision. Attention was then directed to the umbilicus where hemostat was used to identify the fascial layer at the umbilicus, and Veress needle used to try to achieve pneumoperitoneum. Abdominal pressures were greater than expected at 14-15 mm so laparoscopic direct insertion technique under low pressures was then attempted using a blunt trocar 10 mm and camera visualization throughout insertion, which allowed easy entry of the abdominal cavity identifying extensive omental adhesions from the umbilicus inferior. The left side was clear sufficiently to place a trocar of 5 mm into the left lower quadrant under direct visualization. The surgeon repositioned himself to the left side, a second trocar was placed in the left upper quadrant on the left side also under direct visualization, 10 cm above the first to allow access to the omental adhesions. Using the camera in the left lower quadrant port and harmonic scalpel in the the left upper quadrant, we were able to take down the central midline adhesions which were thin filmy and did not involve the bowel. Upon returning to the vaginal trochars and positioning was  surgeon on the patient's right side we were then able to address the pelvis and free up the omental adhesions to the pelvis identifying the sigmoid colon as being thinly adherent to the left pelvis sidewall and the right pelvic side had some ileocecal adhesions as well as small bowel adhesions, all of which could be dissected free either by traction and countertraction and or harmonic scalpel transection using the harmonic Ace 7. pelvis anatomy was restored to normal at  this time the right adnexa could be visualized sufficiently that the ureter could be identified in the retroperitoneum, well away from the IP ligament. The IP ligament was then transected using Harmonic Ace 7 and the specimen placed in the pelvis the left side was treated similarly with the ureter able to be visualized peristalsing well away from the left IP ligament and the tube and ovary sharply dissected off of the pelvic sidewall under careful dissection and visualization, with good hemostasis. Photos were taken intermittently during the case. The pelvis was irrigated intermittently. Specimens were then collected in an Endo Catch bag inserted through the trocar site which had an 11 mm trocar. Specimen could be extracted and the trocar sleeve reinserted and the pelvis reinspected for hemostasis. Lesions in the abdomen with 50 cc of saline left in the abdomen was performed and instruments from had been removed. The fascial closure at the suprapubic and umbilical site was done using S retractors to visualize the fascial edges, which were then closed using 0 Vicryl. The subcutaneous tissues were reapproximated all 5 sites using subcuticular 4-0 Vicryl and Steri-Strips were applied patient then was allowed to waken good recovery room in good condition with sponge and needle counts correct and EBL 25 cc

## 2015-11-22 NOTE — Progress Notes (Signed)
[facility] pt coming to [facility]. Lesly Dukesachel J Everett, RN

## 2015-11-22 NOTE — Progress Notes (Signed)
Day of Surgery Procedure(s) (LRB): LAPAROSCOPIC BILATERAL SALPINGO OOPHORECTOMY (Bilateral)  Subjective: Patient reports incisional pain, tolerating PO and no problems voiding.    Objective: I have reviewed patient's vital signs, labs and cbg's are above expected levels..  General: alert and no distress GI: soft, non-tender; bowel sounds normal; no masses,  no organomegaly CBG (last 3)   Recent Labs  11/22/15 1137 11/22/15 1558 11/22/15 1747  GLUCAP 227* 289* 323*    Assessment: s/p Procedure(s): LAPAROSCOPIC BILATERAL SALPINGO OOPHORECTOMY (Bilateral): hyperglycemia exacerbated by steroids required by nausea of surgery.  Plan: CHO modified diet Sliding scale insulin Pt back on PO and sq diabetic meds.     [name] V 11/22/2015, 6:34 PM

## 2015-11-22 NOTE — Progress Notes (Signed)
Pt has history of Diabetes. No insulin ordered. RN paged MD for insulin coverage. No answer. Pt complained of being sweaty and hot. Temp was 98.6. Paged MD again and RN explained situation. MD ordered to give 4 units of Novolog. MD said he would put in sliding scale orders. RN will continue to monitor patient. Lesly Dukesachel J Everett, RN

## 2015-11-22 NOTE — Anesthesia Procedure Notes (Signed)
Procedure Name: Intubation Date/Time: 11/22/2015 7:54 AM Performed by: Glynn OctaveANIEL, Morna Flud E Pre-anesthesia Checklist: Patient identified, Patient being monitored, Timeout performed, Emergency Drugs available and Suction available Patient Re-evaluated:Patient Re-evaluated prior to inductionOxygen Delivery Method: Circle System Utilized Preoxygenation: Pre-oxygenation with 100% oxygen Intubation Type: IV induction, Rapid sequence and Cricoid Pressure applied Ventilation: Mask ventilation without difficulty Laryngoscope Size: Mac and 3 Grade View: Grade I Tube type: Oral Tube size: 7.0 mm Number of attempts: 1 Airway Equipment and Method: Stylet Placement Confirmation: ETT inserted through vocal cords under direct vision,  positive ETCO2 and breath sounds checked- equal and bilateral Secured at: 21 cm Tube secured with: Tape Dental Injury: Teeth and Oropharynx as per pre-operative assessment  Comments: Good view, large tongue

## 2015-11-22 NOTE — Transfer of Care (Signed)
Immediate Anesthesia Transfer of Care Note  Patient: [name]  Procedure(s) Performed: Procedure(s): LAPAROSCOPIC BILATERAL SALPINGO OOPHORECTOMY (Bilateral)  Patient Location: PACU  Anesthesia Type:General  Level of Consciousness: awake  Airway & Oxygen Therapy: Patient Spontanous Breathing and Patient connected to face mask oxygen  Post-op Assessment: Report given to RN and Post -op Vital signs reviewed and stable  Post vital signs: Reviewed and stable  Last Vitals:  Filed Vitals:   11/22/15 0735 11/22/15 0740  BP: 130/93 145/97  Temp:    Resp: 14 19    Complications: No apparent anesthesia complications

## 2015-11-22 NOTE — Anesthesia Postprocedure Evaluation (Signed)
Anesthesia Post Note  Patient: Cynthia SaversFelice A Kimmons  Procedure(s) Performed: Procedure(s) (LRB): LAPAROSCOPIC BILATERAL SALPINGO OOPHORECTOMY (Bilateral)  Patient location during evaluation: Nursing Unit Anesthesia Type: General Level of consciousness: awake and alert and oriented Pain management: pain level controlled Respiratory status: spontaneous breathing Cardiovascular status: blood pressure returned to baseline Anesthetic complications: no    Last Vitals:  Filed Vitals:   11/22/15 1100 11/22/15 1127  BP: 128/91 122/68  Pulse: 100 92  Temp:  36.9 C  Resp: 15 16    Last Pain:  Filed Vitals:   11/22/15 1128  PainSc: 3                  Iden Stripling

## 2015-11-22 NOTE — Interval H&P Note (Signed)
History and Physical Interval Note:  11/22/2015 7:26 AM  [name]  has presented today for surgery, with the diagnosis of pelvic pain  The various methods of treatment have been discussed with the patient and family. After consideration of risks, benefits and other options for treatment, the patient has consented to  Procedure(s): LAPAROSCOPIC BILATERAL SALPINGO OOPHORECTOMY (Bilateral) as a surgical intervention .  The patient's history has been reviewed, patient examined, no change in status, stable for surgery.  I have reviewed the patient's chart and labs.  Questions were answered to the patient's satisfaction.     Tilda BurrowFERGUSON,Kaileen Bronkema V

## 2015-11-22 NOTE — Anesthesia Preprocedure Evaluation (Signed)
Anesthesia Evaluation  Patient identified by MRN, date of birth, ID band Patient awake    Reviewed: Allergy & Precautions, NPO status , Patient's Chart, lab work & pertinent test results  History of Anesthesia Complications (+) PONV and history of anesthetic complications  Airway Mallampati: III  TM Distance: >3 FB     Dental  (+) Edentulous Upper, Edentulous Lower   Pulmonary shortness of breath and with exertion, former smoker,    breath sounds clear to auscultation       Cardiovascular hypertension, Pt. on medications  Rhythm:Regular Rate:Normal     Neuro/Psych  Headaches, PSYCHIATRIC DISORDERS (PTSD) Depression    GI/Hepatic GERD  Medicated and Controlled,  Endo/Other  diabetes  Renal/GU      Musculoskeletal   Abdominal   Peds  Hematology   Anesthesia Other Findings   Reproductive/Obstetrics                             Anesthesia Physical Anesthesia Plan  ASA: II  Anesthesia Plan: General   Post-op Pain Management:    Induction: Intravenous and Rapid sequence  Airway Management Planned: Oral ETT  Additional Equipment:   Intra-op Plan:   Post-operative Plan: Extubation in OR  Informed Consent: I have reviewed the patients History and Physical, chart, labs and discussed the procedure including the risks, benefits and alternatives for the proposed anesthesia with the patient or authorized representative who has indicated his/her understanding and acceptance.     Plan Discussed with:   Anesthesia Plan Comments:         Anesthesia Quick Evaluation

## 2015-11-22 NOTE — Op Note (Signed)
Please see the brief operative note for surgical details 

## 2015-11-23 ENCOUNTER — Encounter (HOSPITAL_COMMUNITY): Payer: Self-pay | Admitting: Obstetrics and Gynecology

## 2015-11-23 DIAGNOSIS — N736 Female pelvic peritoneal adhesions (postinfective): Secondary | ICD-10-CM | POA: Diagnosis not present

## 2015-11-23 LAB — BASIC METABOLIC PANEL
Anion gap: 6 (ref 5–15)
BUN: 13 mg/dL (ref 6–20)
CHLORIDE: 101 mmol/L (ref 101–111)
CO2: 31 mmol/L (ref 22–32)
CREATININE: 1 mg/dL (ref 0.44–1.00)
Calcium: 8.4 mg/dL — ABNORMAL LOW (ref 8.9–10.3)
GFR calc Af Amer: >60 mL/min (ref >60)
GFR calc non Af Amer: >60 mL/min (ref >60)
Glucose, Bld: 275 mg/dL — ABNORMAL HIGH (ref 65–99)
Potassium: 3.8 mmol/L (ref 3.5–5.1)
Sodium: 138 mmol/L (ref 135–145)

## 2015-11-23 LAB — GLUCOSE, CAPILLARY
Glucose-Capillary: 220 mg/dL — ABNORMAL HIGH (ref 65–99)
Glucose-Capillary: 103 mg/dL — ABNORMAL HIGH (ref 65–99)

## 2015-11-23 LAB — CBC
HEMATOCRIT: 34.2 % — AB (ref 36.0–46.0)
HEMOGLOBIN: 11.3 g/dL — AB (ref 12.0–15.0)
MCH: 30.8 pg (ref 26.0–34.0)
MCHC: 33 g/dL (ref 30.0–36.0)
MCV: 93.2 fL (ref 78.0–100.0)
Platelets: 284 10*3/uL (ref 150–400)
RBC: 3.67 MIL/uL — ABNORMAL LOW (ref 3.87–5.11)
RDW: 12.7 % (ref 11.5–15.5)
WBC: 10.6 10*3/uL — ABNORMAL HIGH (ref 4.0–10.5)

## 2015-11-23 MED ORDER — OXYCODONE-ACETAMINOPHEN 5-325 MG PO TABS: 1.0000 | ORAL_TABLET | ORAL | Status: DC | PRN

## 2015-11-23 MED ORDER — INSULIN ASPART 100 UNIT/ML ~~LOC~~ SOLN
12.0000 [IU] | Freq: Once | SUBCUTANEOUS | Status: AC
  Administered 2015-11-23: 12 [IU] via SUBCUTANEOUS

## 2015-11-23 NOTE — Progress Notes (Signed)
1 Day Post-Op Procedure(s) (LRB): LAPAROSCOPIC BILATERAL SALPINGO OOPHORECTOMY (Bilateral)  Subjective: Patient reports incisional pain.  At a 7.  Objective: I have reviewed patient's vital signs, intake and output and labs. CBC Latest Ref Rng 11/23/2015 11/17/2015 12/24/2014  WBC 4.0 - 10.5 K/uL 10.6(H) 10.7(H) 9.7  Hemoglobin 12.0 - 15.0 g/dL 11.3(L) 13.3 14.4  Hematocrit 36.0 - 46.0 % 34.2(L) 39.1 42.1  Platelets 150 - 400 K/uL 284 304 325   CBG (last 3)   Recent Labs  11/22/15 1558 11/22/15 1747 11/22/15 2133  GLUCAP 289* 323* 182*  will give insulin this a.m, pt will be resuming her outpt metformin today. Will increase the dose x 2 days to adjust for the steroids.    General: alert and no distress Resp: clear to auscultation bilaterally GI: soft, non-tender; bowel sounds normal; no masses,  no organomegaly and incision: clean, dry and intact Extremities: extremities normal, atraumatic, no cyanosis or edema and Homans sign is negative, no sign of DVT  Assessment: s/p Procedure(s): LAPAROSCOPIC BILATERAL SALPINGO OOPHORECTOMY (Bilateral): stable and progressing well  Plan: Advance to PO medication Discharge home     Kamren Heskett V 11/23/2015, 7:44 AM

## 2015-11-23 NOTE — Progress Notes (Signed)
Patient states understanding of discharge instructions, prescription given. 

## 2015-11-23 NOTE — Care Management Note (Signed)
Case Management Note  Patient Details  Name: [name] MRN: [id number] Date of Birth: 1966/06/14  Subjective/Objective:                  Pt is from home and ind with ADL's. Pt obs s/p GYN surgery.   Action/Plan: Pt DC home today. No CM needs.   Expected Discharge Date:    11/23/2015              Expected Discharge Plan:  Home/Self Care  In-House Referral:  NA  Discharge planning Services  CM Consult  Post Acute Care Choice:  NA Choice offered to:  NA  DME Arranged:    DME Agency:     HH Arranged:    HH Agency:     Status of Service:  Completed, signed off  Medicare Important Message Given:    Date Medicare IM Given:    Medicare IM give by:    Date Additional Medicare IM Given:    Additional Medicare Important Message give by:     If discussed at Long Length of Stay Meetings, dates discussed:    Additional Comments:  Malcolm MetroChildress, Analea Muller Demske, RN 11/23/2015, 9:50 AM

## 2015-11-24 ENCOUNTER — Telehealth (HOSPITAL_COMMUNITY): Payer: Self-pay | Admitting: *Deleted

## 2015-11-24 NOTE — Telephone Encounter (Signed)
Prior authorization for Zolpidem received. Submitted online with cover my meds. Awaiting decision.

## 2015-11-25 NOTE — Telephone Encounter (Signed)
noted 

## 2015-11-28 ENCOUNTER — Other Ambulatory Visit (HOSPITAL_COMMUNITY): Payer: Self-pay | Admitting: Psychiatry

## 2015-11-28 ENCOUNTER — Ambulatory Visit (HOSPITAL_COMMUNITY): Payer: Self-pay | Admitting: Psychology

## 2015-12-02 ENCOUNTER — Encounter: Payer: Self-pay | Admitting: Obstetrics and Gynecology

## 2015-12-02 ENCOUNTER — Ambulatory Visit (INDEPENDENT_AMBULATORY_CARE_PROVIDER_SITE_OTHER): Payer: Medicare Other | Admitting: Obstetrics and Gynecology

## 2015-12-02 VITALS — BP 122/84 | Temp 98.3°F | Ht 71.0 in | Wt 207.0 lb

## 2015-12-02 DIAGNOSIS — Z9889 Other specified postprocedural states: Secondary | ICD-10-CM

## 2015-12-02 DIAGNOSIS — Z09 Encounter for follow-up examination after completed treatment for conditions other than malignant neoplasm: Secondary | ICD-10-CM | POA: Insufficient documentation

## 2015-12-02 DIAGNOSIS — N736 Female pelvic peritoneal adhesions (postinfective): Secondary | ICD-10-CM

## 2015-12-02 NOTE — Progress Notes (Signed)
   Subjective:  [name] is a 50 y.o. female now 1.5 weeks status post bilateral oophorectomy, lysis of adhesions and laparoscopy.    pt sore, s/p LYsis of adhesions, extensive and BSO.  Review of Systems Negative except soreness esp at suprapubic site   Diet:   reg   Bowel movements : normal.  Pain is controlled with current analgesics. Medications being used: on chronic pain meds for DJD in neck and back, roxycodone 30, Q12h..  Objective:  BP 122/84 mmHg  Temp(Src) 98.3 F (36.8 C)  Ht 5\' 11"  (1.803 m)  Wt 207 lb (93.895 kg)  BMI 28.88 kg/m2 General:Well developed, well nourished.  No acute distress. Abdomen: Bowel sounds normal, soft, non-tender. P Incision(s):   Healing slowly but adequately, no drainage, no erythema, no hernia, no swelling, no dehiscence,     Assessment:  Post-Op 1.5 weeks s/p bilateral oophorectomy and lysis of adhesions   steady healing   Doing adequately postoperatively.   Plan:  1.Wound care discussed   2. . current medications.roxycodone 30 2-5/d 3. Activity restrictions: no lifting more than 15 pounds 4. return to work: n/a. 5. Follow up in 2 weeks.

## 2015-12-02 NOTE — Progress Notes (Signed)
Patient ID: Cynthia LarkFelice A Sulkowski, female   DOB: [birth year], 50 y.o.   MRN: [id number] Pt here today for post op visit. Pt states that she has not felt good since going home from hospital. Pt is a week out from surgery. Pt states that she is having a lot of cramping and throbbing like a period.

## 2015-12-04 NOTE — Discharge Summary (Signed)
Physician Discharge Summary  Patient ID: [name] MRN: [id number] DOB/AGE: [birth year] 50 y.o.  Admit date: 11/22/2015 Discharge date: 12/04/2015  Admission Diagnoses: Left ovarian adhesions, left ovarian pain due to adhesions. Status post abdominal hysterectomy  Discharge Diagnoses:  Active Problems:   Pelvic peritoneal adhesions, female,postsurgical   Chronic pelvic pain in female   Tubo-ovarian adhesions postsurgical   Postoperative female pelvic peritoneal adhesions   Discharged Condition: good  Hospital Course: *  Expand All Collapse All  Patient ID: [name], female DOB: [birth year], 50 y.o. MRN: [id number]  [facility]  Patient name: Cynthia PamFelice A BrimMRN [id number] Date of birth: [birth year]  CC & HPI:  Cynthia LarkFelice A Geddes is a 50 y.o. female with h/o total abdominal hysterectomy, breast reduction surgery, DM, HTN presenting today for follow up for results of transvaginal non-OB US today. Pt was seen in the office on 11/07/15 and physical exam showed a small, firm, tender, somewhat mobile mass in left adx close to the cuff. Pt reports significant pain on the left side during US today. She notes her pain has been worsening and constant since onset whick was over a year ago, and notable with coughing or valsalve, as well as dyspareunia. Pt also reports 2 years of vasomotor symptoms.  Surgical options of left ovary and tube removal discussed; pt requests that both ovaries and tubes be removed. Pt highly in favor of proceeding surgically. Risks , rationale, alternatives to bilateral salpingo oophorectomy discussed.         The patient underwent laparoscopic bilateral salpingo-oophorectomy and lysis of extensive intraperitoneal adhesions particularly from the omentum to the anterior abdominal wall and to the right adnexa. She was observed overnight for pain management and initial postop care  Consults: None  Significant Diagnostic Studies: labs:  CBC  Latest Ref Rng 11/23/2015 11/17/2015 12/24/2014  WBC 4.0 - 10.5 K/uL 10.6(H) 10.7(H) 9.7  Hemoglobin 12.0 - 15.0 g/dL 11.3(L) 13.3 14.4  Hematocrit 36.0 - 46.0 % 34.2(L) 39.1 42.1  Platelets 150 - 400 K/uL 284 304 325   BMP Latest Ref Rng 11/23/2015 11/17/2015 12/24/2014  Glucose 65 - 99 mg/dL 086(V275(H) 784(O245(H) 78  BUN 6 - 20 mg/dL 13 9 10   Creatinine 0.44 - 1.00 mg/dL 9.621.00 9.520.90 8.410.91  Sodium 135 - 145 mmol/L 138 137 137  Potassium 3.5 - 5.1 mmol/L 3.8 3.4(L) 3.0(L)  Chloride 101 - 111 mmol/L 101 97(L) 98  CO2 22 - 32 mmol/L 31 26 32  Calcium 8.9 - 10.3 mg/dL 3.2(G8.4(L) 9.4 9.3       Treatments: surgery: Laparoscopic lateral salpingo-oophorectomy, lysis of intraperitoneal adhesions.  Postoperative course was notable for slight increased hemoglobin drop for the amount of intraoperative bleeding which was quite low. Through the case hemostasis was excellent,, the possibility of postoperative bleeding exists.   Discharge Exam: Blood pressure 108/80, pulse 72, temperature 98.2 F (36.8 C), temperature source Oral, resp. rate 20, height 5\' 11"  (1.803 m), weight 225 lb 8.5 oz (102.3 kg), SpO2 100 %. General appearance: alert, cooperative and mild distress Head: Normocephalic, without obvious abnormality, atraumatic Resp: clear to auscultation bilaterally GI: soft, non-tender; bowel sounds normal; no masses,  no organomegaly Extremities: extremities normal, atraumatic, no cyanosis or edema and Homans sign is negative, no sign of DVT  Disposition: 01-Home or Self Care  Discharge Instructions     Remove dressing in 72 hours    Complete by:  As directed      Call MD for:  persistant nausea and  vomiting    Complete by:  As directed      Call MD for:  severe uncontrolled pain    Complete by:  As directed      Call MD for:  temperature >100.4    Complete by:  As directed      Diet - low sodium heart healthy    Complete by:  As directed      Increase activity slowly    Complete by:  As directed              Medication List    TAKE these medications        ALPRAZolam 0.5 MG tablet  Commonly known as:  XANAX  Take 1 tablet (0.5 mg total) by mouth 3 (three) times daily as needed for sleep or anxiety.     aspirin 81 MG tablet  Take 81 mg by mouth every morning.     atorvastatin 10 MG tablet  Commonly known as:  LIPITOR  Take 1 tablet by mouth daily.     buPROPion 150 MG 24 hr tablet  Commonly known as:  WELLBUTRIN XL  Take 2 tablets (300 mg total) by mouth every morning.     dexlansoprazole 60 MG capsule  Commonly known as:  DEXILANT  Take 1 capsule (60 mg total) by mouth daily.     DULoxetine 60 MG capsule  Commonly known as:  CYMBALTA  Take 1 capsule (60 mg total) by mouth daily.     fexofenadine 180 MG tablet  Commonly known as:  ALLEGRA  Take 180 mg by mouth every morning.     furosemide 20 MG tablet  Commonly known as:  LASIX  Take 20 mg by mouth 2 (two) times daily.     ibuprofen 800 MG tablet  Commonly known as:  ADVIL,MOTRIN  Take 800 mg by mouth as needed for headache.     lidocaine 5 %  Commonly known as:  LIDODERM  Place 1 patch onto the skin every 12 (twelve) hours as needed (for pain).     LYRICA 75 MG capsule  Generic drug:  pregabalin  Take 75 mg by mouth 2 (two) times daily.     metFORMIN 500 MG 24 hr tablet  Commonly known as:  GLUCOPHAGE-XR  Take 500 mg by mouth daily.     metoprolol 100 MG tablet  Commonly known as:  LOPRESSOR  Take 100 mg by mouth 2 (two) times daily.     multivitamin capsule  Take 1 capsule by mouth daily.     oxycodone 30 MG immediate release tablet  Commonly known as:  ROXICODONE  Take 30 mg by mouth every 6 (six) hours as needed. For pain     oxyCODONE-acetaminophen 5-325 MG tablet  Commonly known as:  PERCOCET/ROXICET  Take 1-2 tablets by mouth every 4 (four) hours as needed for severe pain (moderate to severe pain (when tolerating fluids)).     pantoprazole 40 MG tablet  Commonly known as:  PROTONIX   TAKE (1) TABLET BY MOUTH ONCE DAILY.     PHILLIPS COLON HEALTH PO  Take 1 capsule by mouth daily.     valsartan-hydrochlorothiazide 160-25 MG tablet  Commonly known as:  DIOVAN-HCT  Take 1 tablet by mouth daily.     VICTOZA 18 MG/3ML Sopn  Generic drug:  Liraglutide  Inject 1.8 mg into the skin every evening.     VITAMIN B-12 PO  Take by mouth daily.     VITAMIN D PO  Take 5,000 Units by mouth daily.     VOLTAREN 1 % Gel  Generic drug:  diclofenac sodium  Apply 2 g topically daily as needed (pain).     zolpidem 12.5 MG CR tablet  Commonly known as:  AMBIEN CR  Take 1 tablet (12.5 mg total) by mouth at bedtime as needed for sleep.         SignedTilda Burrow 12/04/2015, 4:40 PM

## 2015-12-14 ENCOUNTER — Encounter (HOSPITAL_COMMUNITY): Payer: Self-pay | Admitting: Psychology

## 2015-12-14 ENCOUNTER — Ambulatory Visit (HOSPITAL_COMMUNITY): Payer: Self-pay | Admitting: Psychology

## 2015-12-27 ENCOUNTER — Ambulatory Visit: Payer: Medicare Other | Admitting: Obstetrics and Gynecology

## 2015-12-28 ENCOUNTER — Other Ambulatory Visit (HOSPITAL_COMMUNITY): Payer: Self-pay | Admitting: Psychiatry

## 2015-12-28 ENCOUNTER — Ambulatory Visit (INDEPENDENT_AMBULATORY_CARE_PROVIDER_SITE_OTHER): Payer: Medicare Other | Admitting: Psychology

## 2015-12-28 ENCOUNTER — Telehealth (HOSPITAL_COMMUNITY): Payer: Self-pay | Admitting: *Deleted

## 2015-12-28 DIAGNOSIS — G894 Chronic pain syndrome: Secondary | ICD-10-CM | POA: Diagnosis not present

## 2015-12-28 DIAGNOSIS — F332 Major depressive disorder, recurrent severe without psychotic features: Secondary | ICD-10-CM | POA: Diagnosis not present

## 2015-12-28 MED ORDER — TEMAZEPAM 15 MG PO CAPS: 15.0000 mg | ORAL_CAPSULE | Freq: Every evening | ORAL | Status: DC | PRN

## 2015-12-28 NOTE — Telephone Encounter (Signed)
restoril printed, she will need to pick it up

## 2015-12-28 NOTE — Telephone Encounter (Signed)
Pt came into office to see another provider and stated that she is taking her Ambien as prescribed but she is still not sleeping. Per pt, in the last 3 days, she have not slept. Pt number is [phone].

## 2015-12-28 NOTE — Telephone Encounter (Signed)
Called pt and she will come by office to pick up script due to her almost being home. Informed pt of new medication name and she showed understanding.

## 2015-12-29 ENCOUNTER — Encounter (HOSPITAL_COMMUNITY): Payer: Self-pay | Admitting: Psychology

## 2015-12-29 ENCOUNTER — Encounter (HOSPITAL_COMMUNITY): Payer: Self-pay | Admitting: *Deleted

## 2015-12-29 NOTE — Progress Notes (Signed)
   Patient:  [name]   DOB: [birth year]  MR Number: [id number]  Location: [facility] [address] [place] [address] Dept: [phone]  Start: 1 PM End: 2 PM  Provider/Observer:     Hershal Coria PSYD  Chief Complaint:      Chief Complaint  Patient presents with  . Anxiety  . Depression  . Stress  . Trauma  . Agitation    Reason For Service:     The patient was referred because of increasing symptoms of depression. Relationship issues, financial problems, medical issues, and loss of her brother in 2002 were all problematic situations for her to deal with. Most recently, major conflicts between her, her knees, and her daughter have also been quite disruptive. At this point, the patient has continued to struggle with severe back issues along with severe depression.  Interventions Strategy:  Cognitive/behavioral psychotherapy  Participation Level:   Active  Participation Quality:  Appropriate      Behavioral Observation:  Well Groomed, Alert, and Appropriate.   Current Psychosocial Factors: The patient reports that there is continued to be a lot of stress within the family. Her sister who suffered severe medical complications including a stroke and removal of a large portion of her intestinal tract is in need of some caregivers and long-term care. The family has been clearly putting a lot of pressure on the patient to take over this role but she is in no situation or shape to do so. She has been taking care of her mother after a stroke as well as adopting one of her sister's kids and racing her her daughters     Co 2 children.ntent of Session:   Reviewed current symptoms and continued work on therapeutic interventions  Current Status:   The patient reports that she has had some improvement in the intensity of her depression anxiety with some alleviation some of the psychosocial  pressures to some degree but they continue to be quite significant.  Patient Progress:   Stable  Target Goals:   Target goals include reducing symptoms of depression, anxiety, and increase coping skills.  Last Reviewed:   12/28/2015  Goals Addressed Today:    Today we worked on adjusted issues with the dissolution of her relationship with "PJ"  Impression/Diagnosis:   The patient has had significant psychosocial stressors throughout her life going on going back to childhood. Most recently she has been taking care of her g as well as her sister's children. Her sister died several years ago and she has raised her sister's children. She also adopted one of her sister's children her. The patient has her own daughter who has that was serious and significant substance abuse. The patient has recurrent major depressive events.randmother who had a severe stroke  Diagnosis:    Axis I:  Major depressive disorder, recurrent, severe without psychotic features (HCC)  Pain syndrome, chronic      Axis II: No diagnosis

## 2015-12-29 NOTE — Progress Notes (Signed)
Pt came into office to pick up printed script that was requested yesterday. Pt D/L number is [id number] with expiration date of 03-05-22. Pt is aware of new script name and shows understanding. Pt agreed with script.

## 2016-01-02 ENCOUNTER — Ambulatory Visit: Payer: Medicare Other | Admitting: Obstetrics and Gynecology

## 2016-01-03 ENCOUNTER — Other Ambulatory Visit (HOSPITAL_COMMUNITY): Payer: Self-pay | Admitting: Psychiatry

## 2016-01-05 ENCOUNTER — Ambulatory Visit: Payer: Medicare Other | Admitting: Obstetrics and Gynecology

## 2016-01-13 ENCOUNTER — Encounter: Payer: Medicare Other | Admitting: Obstetrics and Gynecology

## 2016-01-18 ENCOUNTER — Ambulatory Visit (HOSPITAL_COMMUNITY): Payer: Self-pay | Admitting: Psychology

## 2016-01-18 ENCOUNTER — Encounter: Payer: Self-pay | Admitting: Obstetrics and Gynecology

## 2016-01-18 ENCOUNTER — Ambulatory Visit (INDEPENDENT_AMBULATORY_CARE_PROVIDER_SITE_OTHER): Payer: Medicare Other | Admitting: Obstetrics and Gynecology

## 2016-01-18 VITALS — BP 120/78 | Ht 71.0 in | Wt 206.0 lb

## 2016-01-18 DIAGNOSIS — R1030 Lower abdominal pain, unspecified: Secondary | ICD-10-CM | POA: Diagnosis not present

## 2016-01-18 DIAGNOSIS — R102 Pelvic and perineal pain: Secondary | ICD-10-CM

## 2016-01-18 DIAGNOSIS — R1032 Left lower quadrant pain: Secondary | ICD-10-CM | POA: Diagnosis not present

## 2016-01-18 DIAGNOSIS — N949 Unspecified condition associated with female genital organs and menstrual cycle: Secondary | ICD-10-CM

## 2016-01-18 DIAGNOSIS — G8929 Other chronic pain: Secondary | ICD-10-CM

## 2016-01-18 NOTE — Progress Notes (Signed)
Patient ID: [name], female   DOB: Feb 10, 1966, 50 y.o.   MRN: [id number]   [facility] Visit  Patient name: [name] MRN [id number]  Date of birth: 1966-11-17  CC & HPI:  [name] is a 50 y.o. female, who is 57 days s/p bilateral oophorectomy, lysis of adhesions and laparoscopy,  presenting today for severe, intermittent, pounding/tearing LLQ abd pain. Pt also complains of loose bowel movements; noting, however, that she has IBS at baseline and her bowel movements are never regular.    ROS:  10 Systems reviewed and all are negative for acute change except as noted in the HPI. Pertinent History Reviewed:   Reviewed: Significant for IBS, DM, HTN, total abd hysterectomy.  Medical         Past Medical History  Diagnosis Date  . Depression   . Diabetes mellitus   . LBP (low back pain)   . BMI (body mass index) 20.0-29.9 2008 192 lbs  . GERD (gastroesophageal reflux disease) 2002    202 lbs  . Irritable bowel syndrome 2008    diarrhea predominant MEDS TRIED: BENTYL, LEVBID  . RUQ abdominal pain 2004 HIDA NL CTA w/ IVC-FATTY LIVER, ANGIOMYOLIPOMA LEFT KIDNEY    2008 HIDA NL  . PTSD (post-traumatic stress disorder)   . Hypertension   . Complication of anesthesia     post op N/V  . PONV (postoperative nausea and vomiting)                               Surgical Hx:    Past Surgical History  Procedure Laterality Date  . Total abdominal hysterectomy  2000 fibroids/DUB  . Shoulder surgery  1997 RIGHT  . Breast reduction surgery  2006  . Lumbar disc surgery  2005  . Ankle surgery  RIGHT  . Colonoscopy  2008 RMR    PAN-COLONIC TICS, NL TI  . Upper gastrointestinal endoscopy  2008 RMR    SML HH o/w NL  . Esophageal dilation N/A 04/06/2015    Procedure: ESOPHAGEAL DILATION;  Surgeon: Corbin Ade, MD;  Location: [facility];  Service: Gastroenterology;  Laterality: N/A;  300  . Esophagogastroduodenoscopy N/A 04/06/2015    RMR: Normal EGD status post Maloney  dilation of teh esophagus as described above  . Vocal cord polyp removal  2013    Dr. Josephina Shih  . Colonoscopy  2013    Dr. Teena Dunk: diverticulum in ascending colon, mild diverticulosis in sigmoid. Recommended 5 year surveillance  . Laparoscopic bilateral salpingo oopherectomy Bilateral 11/22/2015    Procedure: LAPAROSCOPIC BILATERAL SALPINGO OOPHORECTOMY;  Surgeon: Tilda Burrow, MD;  Location: [facility] ORS;  Service: Gynecology;  Laterality: Bilateral;   Medications: Reviewed & Updated - see associated section                       Current outpatient prescriptions:  .  ALPRAZolam (XANAX) 0.5 MG tablet, Take 1 tablet (0.5 mg total) by mouth 3 (three) times daily as needed for sleep or anxiety., Disp: 90 tablet, Rfl: 2 .  aspirin 81 MG tablet, Take 81 mg by mouth every morning. , Disp: , Rfl:  .  atorvastatin (LIPITOR) 10 MG tablet, Take 1 tablet by mouth daily., Disp: , Rfl:  .  buPROPion (WELLBUTRIN XL) 150 MG 24 hr tablet, Take 2 tablets (300 mg total) by mouth every morning., Disp: 60 tablet, Rfl: 2 .  Cholecalciferol (VITAMIN D PO), Take 5,000 Units by mouth daily., Disp: , Rfl:  .  Cyanocobalamin (VITAMIN B-12 PO), Take by mouth daily., Disp: , Rfl:  .  dexlansoprazole (DEXILANT) 60 MG capsule, Take 1 capsule (60 mg total) by mouth daily., Disp: 30 capsule, Rfl: 11 .  DULoxetine (CYMBALTA) 60 MG capsule, Take 1 capsule (60 mg total) by mouth daily., Disp: 30 capsule, Rfl: 2 .  fexofenadine (ALLEGRA) 180 MG tablet, Take 180 mg by mouth every morning. , Disp: , Rfl:  .  furosemide (LASIX) 20 MG tablet, Take 20 mg by mouth 2 (two) times daily.  , Disp: , Rfl:  .  ibuprofen (ADVIL,MOTRIN) 800 MG tablet, Take 800 mg by mouth as needed for headache., Disp: , Rfl:  .  lidocaine (LIDODERM) 5 %, Place 1 patch onto the skin every 12 (twelve) hours as needed (for pain). , Disp: , Rfl:  .  Liraglutide (VICTOZA) 18 MG/3ML SOPN, Inject 1.8 mg into the skin every evening. , Disp: , Rfl:  .  LYRICA 75 MG  capsule, Take 75 mg by mouth 2 (two) times daily. , Disp: , Rfl:  .  metFORMIN (GLUCOPHAGE-XR) 500 MG 24 hr tablet, Take 500 mg by mouth daily., Disp: , Rfl:  .  metoprolol (LOPRESSOR) 100 MG tablet, Take 100 mg by mouth 2 (two) times daily. , Disp: , Rfl:  .  Multiple Vitamin (MULTIVITAMIN) capsule, Take 1 capsule by mouth daily.  , Disp: , Rfl:  .  oxycodone (ROXICODONE) 30 MG immediate release tablet, Take 30 mg by mouth every 6 (six) hours as needed. For pain, Disp: , Rfl:  .  oxyCODONE-acetaminophen (PERCOCET/ROXICET) 5-325 MG tablet, Take 1-2 tablets by mouth every 4 (four) hours as needed for severe pain (moderate to severe pain (when tolerating fluids))., Disp: 15 tablet, Rfl: 0 .  pantoprazole (PROTONIX) 40 MG tablet, TAKE (1) TABLET BY MOUTH ONCE DAILY., Disp: 30 tablet, Rfl: 2 .  Probiotic Product (PHILLIPS COLON HEALTH PO), Take 1 capsule by mouth daily., Disp: , Rfl:  .  temazepam (RESTORIL) 15 MG capsule, Take 1 capsule (15 mg total) by mouth at bedtime as needed for sleep., Disp: 30 capsule, Rfl: 1 .  valsartan-hydrochlorothiazide (DIOVAN-HCT) 160-25 MG per tablet, Take 1 tablet by mouth daily., Disp: , Rfl:  .  VOLTAREN 1 % GEL, Apply 2 g topically daily as needed (pain). , Disp: , Rfl:  .  zolpidem (AMBIEN CR) 12.5 MG CR tablet, Take 1 tablet (12.5 mg total) by mouth at bedtime as needed for sleep., Disp: 30 tablet, Rfl: 2   Social History: Reviewed -  reports that she quit smoking about 8 months ago. Her smoking use included Cigarettes. She has a 1 pack-year smoking history. She has never used smokeless tobacco.  Objective Findings:  Vitals: Blood pressure 120/78, height  (1.803 m), weight 206 lb (93.441 kg).  Physical Examination: General appearance - alert, well appearing, and in no distress Abdomen - vague abd fullness without masses.  Pelvic - normal external genitalia, vulva, vagina, cervix, uterus and adnexa, VULVA: normal appearing vulva with no masses, tenderness  or lesions, VAGINA: normal appearing vagina with normal color and discharge, no lesions, CERVIX: normal appearing cervix without discharge or lesions, surgically absent, UTERUS: surgically absent, vaginal cuff well healed, ADNEXA: normal adnexa in size, nontender and no masses, surgically absent bilateral, slight fullness at adnexa probable bowel.   Assessment & Plan:   A:  1. Chronic pelvic pain, probable pelvic adhesions involving  bowel.   2. S/post hyst, s/p laparoscopic BSO and Lysis of adhesions  P:  1. Pelvic US to rule out ovarian remnant syndrome.  I suspect chronic adhesions.  By signing my name below, I, Marica Otter, attest that this documentation has been prepared under the direction and in the presence of Christin Bach, MD. Electronically Signed: Marica Otter, ED Scribe. 01/18/2016. 4:34 PM.   I personally performed the services described in this documentation, which was SCRIBED in my presence. The recorded information has been reviewed and considered accurate. It has been edited as necessary during review. Tilda Burrow, MD

## 2016-01-18 NOTE — Progress Notes (Signed)
Patient ID: [name], female   DOB: [birth year], 50 y.o.   MRN: [id number] Pt her today for post op visit. Pt states that she is having abdominal pain. Pt states that its more on the left side and lower abdominal/pelvic area. Pt states that she has a pounding pain at times.

## 2016-01-26 ENCOUNTER — Other Ambulatory Visit: Payer: Self-pay | Admitting: Obstetrics and Gynecology

## 2016-01-26 DIAGNOSIS — R1032 Left lower quadrant pain: Secondary | ICD-10-CM

## 2016-01-30 ENCOUNTER — Other Ambulatory Visit (HOSPITAL_COMMUNITY): Payer: Self-pay | Admitting: Psychiatry

## 2016-01-30 ENCOUNTER — Other Ambulatory Visit: Payer: Medicare Other

## 2016-01-30 ENCOUNTER — Ambulatory Visit: Payer: Medicare Other | Admitting: Obstetrics and Gynecology

## 2016-02-03 ENCOUNTER — Ambulatory Visit: Payer: Medicare Other | Admitting: Obstetrics and Gynecology

## 2016-02-03 ENCOUNTER — Ambulatory Visit: Payer: Medicare Other

## 2016-02-06 ENCOUNTER — Ambulatory Visit (INDEPENDENT_AMBULATORY_CARE_PROVIDER_SITE_OTHER): Payer: Medicare Other | Admitting: Obstetrics and Gynecology

## 2016-02-06 ENCOUNTER — Ambulatory Visit (INDEPENDENT_AMBULATORY_CARE_PROVIDER_SITE_OTHER): Payer: Medicare Other

## 2016-02-06 ENCOUNTER — Encounter: Payer: Self-pay | Admitting: Obstetrics and Gynecology

## 2016-02-06 VITALS — BP 118/80 | Ht 71.0 in | Wt 207.0 lb

## 2016-02-06 DIAGNOSIS — R1032 Left lower quadrant pain: Secondary | ICD-10-CM

## 2016-02-06 DIAGNOSIS — G8929 Other chronic pain: Secondary | ICD-10-CM

## 2016-02-06 DIAGNOSIS — N949 Unspecified condition associated with female genital organs and menstrual cycle: Secondary | ICD-10-CM

## 2016-02-06 DIAGNOSIS — R102 Pelvic and perineal pain: Principal | ICD-10-CM

## 2016-02-06 NOTE — Progress Notes (Signed)
PELVIC US TV/TA: normal vag cuff,bilat adnexa's wnl,no free fluid seen, lt adnexal pain during ultrasound

## 2016-02-06 NOTE — Progress Notes (Signed)
Patient ID: [name], female   DOB: 1966/11/19, 50 y.o.   MRN: [id number]   [facility]  Patient name: [name] MRN [id number]  Date of birth: 1966/11/19  CC & HPI:  [name] is a 50 y.o. female presenting today for 1. Fear of extreme pain noted when she has to cough.located in lower abd bilaterally. 2. Unable to lie on stomach or turn over w/o extreme pain.3 frustrated by "constant" every day, all day pain. That keeps her lying around. Pt reports she has MORE pain from this surgery than her original hysterectomy. Laparoscopy was Jan 3. Pt is now 80 days s/p laparoscopic BSO.with LOA   ROS:  Pt sneezed and it hurt tremendously. Worse than surgery hurts worse thatn previous open hysterectomy.  Pertinent History Reviewed:   Reviewed: Significant for pt with hx DJD ,and takes Roxycodone 30 mg x 5  Tabs /day. Medical         Past Medical History  Diagnosis Date  . Depression   . Diabetes mellitus   . LBP (low back pain)   . BMI (body mass index) 20.0-29.9 2008 192 lbs  . GERD (gastroesophageal reflux disease) 2002    202 lbs  . Irritable bowel syndrome 2008    diarrhea predominant MEDS TRIED: BENTYL, LEVBID  . RUQ abdominal pain 2004 HIDA NL CTA w/ IVC-FATTY LIVER, ANGIOMYOLIPOMA LEFT KIDNEY    2008 HIDA NL  . PTSD (post-traumatic stress disorder)   . Hypertension   . Complication of anesthesia     post op N/V  . PONV (postoperative nausea and vomiting)                               Surgical Hx:    Past Surgical History  Procedure Laterality Date  . Total abdominal hysterectomy  2000 fibroids/DUB  . Shoulder surgery  1997 RIGHT  . Breast reduction surgery  2006  . Lumbar disc surgery  2005  . Ankle surgery  RIGHT  . Colonoscopy  2008 RMR    PAN-COLONIC TICS, NL TI  . Upper gastrointestinal endoscopy  2008 RMR    SML HH o/w NL  . Esophageal dilation N/A 04/06/2015    Procedure: ESOPHAGEAL DILATION;  Surgeon: Corbin Adeobert M Rourk, MD;  Location: [facility]  END[facility];  Service: Gastroenterology;  Laterality: N/A;  300  . Esophagogastroduodenoscopy N/A 04/06/2015    RMR: Normal EGD status post Maloney dilation of teh esophagus as described above  . Vocal cord polyp removal  2013    Dr. Josephina ShihEwain Wilson  . Colonoscopy  2013    Dr. Teena DunkBenson: diverticulum in ascending colon, mild diverticulosis in sigmoid. Recommended 5 year surveillance  . Laparoscopic bilateral salpingo oopherectomy Bilateral 11/22/2015    Procedure: LAPAROSCOPIC BILATERAL SALPINGO OOPHORECTOMY;  Surgeon: Tilda BurrowJohn Aberdeen Hafen V, MD;  Location: [facility]S;  Service: Gynecology;  Laterality: Bilateral;   Medications: Reviewed & Updated - see associated section                       Current outpatient prescriptions:  .  ALPRAZolam (XANAX) 0.5 MG tablet, Take 1 tablet (0.5 mg total) by mouth 3 (three) times daily as needed for sleep or anxiety., Disp: 90 tablet, Rfl: 2 .  aspirin 81 MG tablet, Take 81 mg by mouth every morning. , Disp: , Rfl:  .  atorvastatin (LIPITOR) 10 MG tablet, Take 1 tablet by  mouth daily., Disp: , Rfl:  .  buPROPion (WELLBUTRIN XL) 150 MG 24 hr tablet, Take 2 tablets (300 mg total) by mouth every morning., Disp: 60 tablet, Rfl: 2 .  Cholecalciferol (VITAMIN D PO), Take 5,000 Units by mouth daily., Disp: , Rfl:  .  Cyanocobalamin (VITAMIN B-12 PO), Take by mouth daily., Disp: , Rfl:  .  dexlansoprazole (DEXILANT) 60 MG capsule, Take 1 capsule (60 mg total) by mouth daily., Disp: 30 capsule, Rfl: 11 .  DULoxetine (CYMBALTA) 60 MG capsule, Take 1 capsule (60 mg total) by mouth daily., Disp: 30 capsule, Rfl: 2 .  fexofenadine (ALLEGRA) 180 MG tablet, Take 180 mg by mouth every morning. , Disp: , Rfl:  .  furosemide (LASIX) 20 MG tablet, Take 20 mg by mouth 2 (two) times daily.  , Disp: , Rfl:  .  ibuprofen (ADVIL,MOTRIN) 800 MG tablet, Take 800 mg by mouth as needed for headache., Disp: , Rfl:  .  lidocaine (LIDODERM) 5 %, Place 1 patch onto the skin every 12 (twelve) hours as needed  (for pain). , Disp: , Rfl:  .  Liraglutide (VICTOZA) 18 MG/3ML SOPN, Inject 1.8 mg into the skin every evening. , Disp: , Rfl:  .  LYRICA 75 MG capsule, Take 75 mg by mouth 2 (two) times daily. , Disp: , Rfl:  .  metFORMIN (GLUCOPHAGE-XR) 500 MG 24 hr tablet, Take 500 mg by mouth daily., Disp: , Rfl:  .  metoprolol (LOPRESSOR) 100 MG tablet, Take 100 mg by mouth 2 (two) times daily. , Disp: , Rfl:  .  Multiple Vitamin (MULTIVITAMIN) capsule, Take 1 capsule by mouth daily.  , Disp: , Rfl:  .  oxycodone (ROXICODONE) 30 MG immediate release tablet, Take 30 mg by mouth every 6 (six) hours as needed. For pain, Disp: , Rfl:  .  oxyCODONE-acetaminophen (PERCOCET/ROXICET) 5-325 MG tablet, Take 1-2 tablets by mouth every 4 (four) hours as needed for severe pain (moderate to severe pain (when tolerating fluids))., Disp: 15 tablet, Rfl: 0 .  pantoprazole (PROTONIX) 40 MG tablet, TAKE (1) TABLET BY MOUTH ONCE DAILY., Disp: 30 tablet, Rfl: 2 .  Probiotic Product (PHILLIPS COLON HEALTH PO), Take 1 capsule by mouth daily., Disp: , Rfl:  .  temazepam (RESTORIL) 15 MG capsule, Take 1 capsule (15 mg total) by mouth at bedtime as needed for sleep., Disp: 30 capsule, Rfl: 1 .  valsartan-hydrochlorothiazide (DIOVAN-HCT) 160-25 MG per tablet, Take 1 tablet by mouth daily., Disp: , Rfl:  .  VOLTAREN 1 % GEL, Apply 2 g topically daily as needed (pain). , Disp: , Rfl:  .  zolpidem (AMBIEN CR) 12.5 MG CR tablet, Take 1 tablet (12.5 mg total) by mouth at bedtime as needed for sleep., Disp: 30 tablet, Rfl: 2   Social History: Reviewed -  reports that she quit smoking about 8 months ago. Her smoking use included Cigarettes. She has a 1 pack-year smoking history. She has never used smokeless tobacco.  Objective Findings:  Vitals: Blood pressure 118/80, height  (1.803 m), weight 207 lb (93.895 kg).  Physical Examination: General appearance - alert, well appearing, and in no distress Mental status - alert, oriented to  person, place, and time abd soft, no masses or hernias  laparosopy scars well healed.  Assessment & Plan:   A:  1. Chronic pain syndrome 2. Stable chronic pain. 3  No evidence of surgical complication.  P:  1. Pain meds thru [facility]. 2 periodic monitoring of pain,

## 2016-02-13 ENCOUNTER — Ambulatory Visit (INDEPENDENT_AMBULATORY_CARE_PROVIDER_SITE_OTHER): Payer: Medicare Other | Admitting: Psychiatry

## 2016-02-13 ENCOUNTER — Encounter (HOSPITAL_COMMUNITY): Payer: Self-pay | Admitting: Psychiatry

## 2016-02-13 VITALS — BP 135/97 | HR 82 | Ht 71.0 in | Wt 210.4 lb

## 2016-02-13 DIAGNOSIS — F333 Major depressive disorder, recurrent, severe with psychotic symptoms: Secondary | ICD-10-CM

## 2016-02-13 DIAGNOSIS — F332 Major depressive disorder, recurrent severe without psychotic features: Secondary | ICD-10-CM

## 2016-02-13 MED ORDER — DULOXETINE HCL 60 MG PO CPEP
60.0000 mg | ORAL_CAPSULE | Freq: Every day | ORAL | Status: DC
Start: 2016-02-13 — End: 2016-05-15

## 2016-02-13 MED ORDER — ZOLPIDEM TARTRATE ER 12.5 MG PO TBCR: 12.5000 mg | EXTENDED_RELEASE_TABLET | Freq: Every evening | ORAL | Status: DC | PRN

## 2016-02-13 MED ORDER — BUPROPION HCL ER (XL) 150 MG PO TB24: 300.0000 mg | ORAL_TABLET | ORAL | Status: DC

## 2016-02-13 MED ORDER — ALPRAZOLAM 0.5 MG PO TABS: 0.5000 mg | ORAL_TABLET | Freq: Three times a day (TID) | ORAL | Status: DC | PRN
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
## 2016-02-14 ENCOUNTER — Ambulatory Visit (HOSPITAL_COMMUNITY): Payer: Self-pay | Admitting: Psychiatry

## 2016-02-15 ENCOUNTER — Encounter (HOSPITAL_COMMUNITY): Payer: Self-pay | Admitting: Psychology

## 2016-02-15 ENCOUNTER — Ambulatory Visit (HOSPITAL_COMMUNITY): Payer: Self-pay | Admitting: Psychology

## 2016-02-20 ENCOUNTER — Telehealth (HOSPITAL_COMMUNITY): Payer: Self-pay | Admitting: *Deleted

## 2016-02-20 ENCOUNTER — Ambulatory Visit (HOSPITAL_COMMUNITY): Payer: Self-pay | Admitting: Psychology

## 2016-02-20 NOTE — Telephone Encounter (Signed)
Patient would like a phone call from you.

## 2016-02-27 ENCOUNTER — Encounter (HOSPITAL_COMMUNITY): Payer: Self-pay | Admitting: Psychology

## 2016-02-27 ENCOUNTER — Ambulatory Visit (INDEPENDENT_AMBULATORY_CARE_PROVIDER_SITE_OTHER): Payer: Medicare Other | Admitting: Psychology

## 2016-02-27 DIAGNOSIS — F332 Major depressive disorder, recurrent severe without psychotic features: Secondary | ICD-10-CM | POA: Diagnosis not present

## 2016-02-27 DIAGNOSIS — G894 Chronic pain syndrome: Secondary | ICD-10-CM | POA: Diagnosis not present
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
## 2016-03-15 ENCOUNTER — Ambulatory Visit (INDEPENDENT_AMBULATORY_CARE_PROVIDER_SITE_OTHER): Payer: Medicare Other | Admitting: Psychology

## 2016-03-15 ENCOUNTER — Encounter (HOSPITAL_COMMUNITY): Payer: Self-pay | Admitting: Psychology

## 2016-03-15 DIAGNOSIS — G894 Chronic pain syndrome: Secondary | ICD-10-CM

## 2016-03-15 DIAGNOSIS — F332 Major depressive disorder, recurrent severe without psychotic features: Secondary | ICD-10-CM

## 2016-03-15 NOTE — Progress Notes (Signed)
   Patient:  [name]   DOB: May 25, 1966  MR Number: [id number]  [facility] [address] [place] [address] Dept: [phone]  Start: 2 PM End: 3 PM  Provider/Observer:     Hershal CoriaJohn R Ommie Degeorge PSYD  Chief Complaint:      Chief Complaint  Patient presents with  . Depression  . Anxiety  . Stress    Reason For Service:     The patient was referred because of increasing symptoms of depression. Relationship issues, financial problems, medical issues, and loss of her brother in 2002 were all problematic situations for her to deal with. Most recently, major conflicts between her, her knees, and her daughter have also been quite disruptive. At this point, the patient has continued to struggle with severe back issues along with severe depression.  Interventions Strategy:  Cognitive/behavioral psychotherapy  Participation Level:   Active  Participation Quality:  Appropriate      Behavioral Observation:  Well Groomed, Alert, and Appropriate.   Current Psychosocial Factors: The patient reports that her daughter is still on ventalator and while she is still alive she is not doing well.  She had successful surgery on heart but Reita Maykednys are not doing well.    Co 2 children.ntent of Session:   Reviewed current symptoms and continued work on therapeutic interventions  Current Status:   The patient reports that she has been depressed and stress due to daughter about to die.  Patient Progress:   Stable  Target Goals:   Target goals include reducing symptoms of depression, anxiety, and increase coping skills.  Last Reviewed:   03/15/2016  Goals Addressed Today:    Today we worked on adjusted issues with the dissolution of her relationship with "PJ"  Impression/Diagnosis:   The patient has had significant psychosocial stressors throughout her life going on going back to childhood. Most  recently she has been taking care of her g as well as her sister's children. Her sister died several years ago and she has raised her sister's children. She also adopted one of her sister's children her. The patient has her own daughter who has that was serious and significant substance abuse. The patient has recurrent major depressive events.randmother who had a severe stroke  Diagnosis:    Axis I:  Major depressive disorder, recurrent, severe without psychotic features (HCC)  Pain syndrome, chronic      Axis II: No diagnosis

## 2016-03-16 ENCOUNTER — Ambulatory Visit (INDEPENDENT_AMBULATORY_CARE_PROVIDER_SITE_OTHER): Payer: Medicare Other | Admitting: Obstetrics & Gynecology

## 2016-03-16 ENCOUNTER — Encounter: Payer: Self-pay | Admitting: Obstetrics & Gynecology

## 2016-03-16 VITALS — BP 130/100 | HR 78 | Wt 216.0 lb

## 2016-03-16 DIAGNOSIS — B3731 Acute candidiasis of vulva and vagina: Secondary | ICD-10-CM

## 2016-03-16 DIAGNOSIS — B373 Candidiasis of vulva and vagina: Secondary | ICD-10-CM | POA: Diagnosis not present

## 2016-03-16 MED ORDER — FLUCONAZOLE 100 MG PO TABS: 100.0000 mg | ORAL_TABLET | Freq: Every day | ORAL | Status: DC

## 2016-03-16 MED ORDER — NYSTATIN-TRIAMCINOLONE 100000-0.1 UNIT/GM-% EX OINT: 1.0000 "application " | TOPICAL_OINTMENT | Freq: Two times a day (BID) | CUTANEOUS | Status: DC

## 2016-03-16 NOTE — Progress Notes (Signed)
Patient ID: [name], female   DOB: 1965/12/29, 50 y.o.   MRN: [id number]      Chief Complaint  Patient presents with  . gyn visit    vaginal irritation, itching    Blood pressure 130/100, pulse 78, weight 216 lb (97.977 kg).  50 y.o. No obstetric history on file. No LMP recorded. Patient has had a hysterectomy. The current method of family planning is status post hysterectomy.  Subjective Itching and irritation of the vulva which is coming been going on for at least a week  Objective Vulva:  normal appearing vulva with no masses, tenderness or lesions, vulvar erythema  Vagina:  normal mucosa, curd-like discharge Cervix:  no cervical motion tenderness and no lesions Uterus:   Adnexa: ovaries:,      Pertinent ROS No burning with urination, frequency or urgency No nausea, vomiting or diarrhea Nor fever chills or other constitutional symptoms    Labs or studies     Impression Diagnoses this Encounter::   ICD-9-CM ICD-10-CM   1. Candidal vulvitis 112.1 B37.3     Established relevant diagnosis(es):   Plan/Recommendations: Meds ordered this encounter  Medications  . fluconazole (DIFLUCAN) 100 MG tablet    Sig: Take 1 tablet (100 mg total) by mouth daily.    Dispense:  10 tablet    Refill:  0  . nystatin-triamcinolone ointment (MYCOLOG)    Sig: Apply 1 application topically 2 (two) times daily.    Dispense:  30 g    Refill:  1    Labs or Scans Ordered: No orders of the defined types were placed in this encounter.    Management::   Follow up Return if symptoms worsen or fail to improve.      All questions were answered.

## 2016-03-19 ENCOUNTER — Other Ambulatory Visit (HOSPITAL_COMMUNITY): Payer: Self-pay | Admitting: Psychiatry

## 2016-03-23 ENCOUNTER — Telehealth (HOSPITAL_COMMUNITY): Payer: Self-pay | Admitting: *Deleted

## 2016-03-23 NOTE — Telephone Encounter (Signed)
Pt called stating she would like to leave a message for Dr. Kieth Brightlyodenbough. Per pt, she would like Dr. Kieth Brightlyodenbough to know that her daughter passed on Tuesday and if she need him she will let him know.

## 2016-04-03 ENCOUNTER — Ambulatory Visit (INDEPENDENT_AMBULATORY_CARE_PROVIDER_SITE_OTHER): Payer: Medicare Other | Admitting: Psychology

## 2016-04-03 DIAGNOSIS — F332 Major depressive disorder, recurrent severe without psychotic features: Secondary | ICD-10-CM

## 2016-04-03 DIAGNOSIS — G894 Chronic pain syndrome: Secondary | ICD-10-CM | POA: Diagnosis not present

## 2016-04-13 ENCOUNTER — Ambulatory Visit (HOSPITAL_COMMUNITY): Payer: Self-pay | Admitting: Psychiatry

## 2016-04-18 ENCOUNTER — Encounter (HOSPITAL_COMMUNITY): Payer: Self-pay | Admitting: Psychology

## 2016-04-18 NOTE — Progress Notes (Signed)
   Patient:  [name]   DOB: 02/07/1966  MR Number: [id number]  [facility] [address] [place] [address] Dept: [phone]  Start: 1 PM End: 2 PM  Provider/Observer:     Hershal CoriaJohn R Rodenbough PSYD  Chief Complaint:      Chief Complaint  Patient presents with  . Anxiety  . Depression    Reason For Service:     The patient was referred because of increasing symptoms of depression. Relationship issues, financial problems, medical issues, and loss of her brother in 2002 were all problematic situations for her to deal with. Most recently, major conflicts between her, her knees, and her daughter have also been quite disruptive. At this point, the patient has continued to struggle with severe back issues along with severe depression.  Interventions Strategy:  Cognitive/behavioral psychotherapy  Participation Level:   Active  Participation Quality:  Appropriate      Behavioral Observation:  Well Groomed, Alert, and Appropriate.   Current Psychosocial Factors: The patient reports that her daughter has passed away.  The events around the funeral were very stressful with her daughter's bio father's wife causing a lot of trouble.  The patient reprots that she has gotten through it and the granddaughters have been coping with the death of their mother.    Co 2 children.ntent of Session:   Reviewed current symptoms and continued work on therapeutic interventions  Current Status:   The patient reports that she has been very stressed but coping with loss of her daughter.  Patient Progress:   Stable  Target Goals:   Target goals include reducing symptoms of depression, anxiety, and increase coping skills.  Last Reviewed:   04/03/2016  Goals Addressed Today:    Today we worked on adjusted issues with the dissolution of her relationship with "PJ"  Impression/Diagnosis:   The patient has  had significant psychosocial stressors throughout her life going on going back to childhood. Most recently she has been taking care of her g as well as her sister's children. Her sister died several years ago and she has raised her sister's children. She also adopted one of her sister's children her. The patient has her own daughter who has that was serious and significant substance abuse. The patient has recurrent major depressive events.randmother who had a severe stroke  Diagnosis:    Axis I:  Major depressive disorder, recurrent, severe without psychotic features (HCC)  Pain syndrome, chronic      Axis II: No diagnosis

## 2016-04-20 ENCOUNTER — Ambulatory Visit (HOSPITAL_COMMUNITY): Payer: Self-pay | Admitting: Psychology

## 2016-04-24 ENCOUNTER — Ambulatory Visit (INDEPENDENT_AMBULATORY_CARE_PROVIDER_SITE_OTHER): Payer: Medicare Other | Admitting: Psychology

## 2016-04-24 ENCOUNTER — Other Ambulatory Visit: Payer: Self-pay | Admitting: Internal Medicine

## 2016-04-24 DIAGNOSIS — G894 Chronic pain syndrome: Secondary | ICD-10-CM

## 2016-04-24 DIAGNOSIS — F332 Major depressive disorder, recurrent severe without psychotic features: Secondary | ICD-10-CM

## 2016-05-15 ENCOUNTER — Ambulatory Visit (INDEPENDENT_AMBULATORY_CARE_PROVIDER_SITE_OTHER): Payer: Medicare Other | Admitting: Psychiatry

## 2016-05-15 ENCOUNTER — Encounter (HOSPITAL_COMMUNITY): Payer: Self-pay | Admitting: Psychiatry

## 2016-05-15 VITALS — BP 142/95 | HR 88 | Ht 71.0 in | Wt 201.6 lb

## 2016-05-15 DIAGNOSIS — F332 Major depressive disorder, recurrent severe without psychotic features: Secondary | ICD-10-CM | POA: Diagnosis not present

## 2016-05-15 MED ORDER — ALPRAZOLAM 0.5 MG PO TABS: 0.5000 mg | ORAL_TABLET | Freq: Three times a day (TID) | ORAL | Status: DC | PRN

## 2016-05-15 MED ORDER — BUPROPION HCL ER (XL) 150 MG PO TB24: 300.0000 mg | ORAL_TABLET | ORAL | Status: DC

## 2016-05-15 MED ORDER — ZOLPIDEM TARTRATE ER 12.5 MG PO TBCR: 12.5000 mg | EXTENDED_RELEASE_TABLET | Freq: Every evening | ORAL | Status: DC | PRN

## 2016-05-15 MED ORDER — DULOXETINE HCL 60 MG PO CPEP: 60.0000 mg | ORAL_CAPSULE | Freq: Every day | ORAL | Status: DC
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
## 2016-05-31 ENCOUNTER — Ambulatory Visit (HOSPITAL_COMMUNITY): Payer: Self-pay | Admitting: Psychology

## 2016-07-02 ENCOUNTER — Ambulatory Visit (INDEPENDENT_AMBULATORY_CARE_PROVIDER_SITE_OTHER): Payer: Medicare Other

## 2016-07-02 ENCOUNTER — Encounter: Payer: Self-pay | Admitting: Orthopedic Surgery

## 2016-07-02 ENCOUNTER — Ambulatory Visit (INDEPENDENT_AMBULATORY_CARE_PROVIDER_SITE_OTHER): Payer: Medicare Other | Admitting: Orthopedic Surgery

## 2016-07-02 VITALS — BP 143/90 | Ht 70.5 in | Wt 206.0 lb

## 2016-07-02 DIAGNOSIS — M25511 Pain in right shoulder: Secondary | ICD-10-CM

## 2016-07-02 DIAGNOSIS — M75101 Unspecified rotator cuff tear or rupture of right shoulder, not specified as traumatic: Secondary | ICD-10-CM

## 2016-07-02 DIAGNOSIS — M65311 Trigger thumb, right thumb: Secondary | ICD-10-CM

## 2016-07-02 NOTE — Progress Notes (Signed)
Patient ID: [name], female   DOB: [birth year], 50 y.o.   MRN: [id number]  Chief Complaint  Patient presents with  . Shoulder Pain    right shoulder pain    HPI [name] is a 50 y.o. female.  Presents for evaluation of recent onset of shoulder pain 1 month  This is a 50 year old female treated for chronic pain for other reasons resents with atraumatic onset of right shoulder pain. Back in 1992 she had an arthroscopy of the right shoulder followed by open rotator cuff repair and acromioplasty in a separate procedure done in [place]. She has a history of degenerative disc disease of the cervical spine treated with injections at the pain clinic.  She has a new problem in the right shoulder with pain swelling locking which is described as constant sharp and 8 out of 10 and is worse when she tries to use her arm.  She sought treatment at primary care and at the pain clinic and was referred here eventually for evaluation and treatment  Review of Systems Review of Systems  Eyes: Positive for visual disturbance.  Cardiovascular: Positive for leg swelling.  Musculoskeletal: Positive for arthralgias and back pain.  Allergic/Immunologic: Positive for environmental allergies.  Neurological: Positive for weakness and numbness.  Psychiatric/Behavioral: The patient is nervous/anxious.   All other systems reviewed and are negative.  Past Medical History:  Diagnosis Date  . BMI (body mass index) 20.0-29.9 2008 192 lbs  . Complication of anesthesia    post op N/V  . Depression   . Diabetes mellitus   . GERD (gastroesophageal reflux disease) 2002   202 lbs  . Hypertension   . Irritable bowel syndrome 2008   diarrhea predominant MEDS TRIED: BENTYL, LEVBID  . LBP (low back pain)   . PONV (postoperative nausea and vomiting)   . PTSD (post-traumatic stress disorder)   . RUQ abdominal pain 2004 HIDA NL CTA w/ IVC-FATTY LIVER, ANGIOMYOLIPOMA LEFT KIDNEY   2008 HIDA NL    Past  Surgical History:  Procedure Laterality Date  . ANKLE SURGERY  RIGHT  . BREAST REDUCTION SURGERY  2006  . COLONOSCOPY  2008 RMR   PAN-COLONIC TICS, NL TI  . COLONOSCOPY  2013   Dr. Teena DunkBenson: diverticulum in ascending colon, mild diverticulosis in sigmoid. Recommended 5 year surveillance  . ESOPHAGEAL DILATION N/A 04/06/2015   Procedure: ESOPHAGEAL DILATION;  Surgeon: Corbin Adeobert M Rourk, MD;  Location: [facility];  Service: Gastroenterology;  Laterality: N/A;  300  . ESOPHAGOGASTRODUODENOSCOPY N/A 04/06/2015   RMR: Normal EGD status post Maloney dilation of teh esophagus as described above  . LAPAROSCOPIC BILATERAL SALPINGO OOPHERECTOMY Bilateral 11/22/2015   Procedure: LAPAROSCOPIC BILATERAL SALPINGO OOPHORECTOMY;  Surgeon: Tilda BurrowJohn Ferguson V, MD;  Location: [facility];  Service: Gynecology;  Laterality: Bilateral;  . LUMBAR DISC SURGERY  2005  . SHOULDER SURGERY  1997 RIGHT  . TOTAL ABDOMINAL HYSTERECTOMY  2000 fibroids/DUB  . UPPER GASTROINTESTINAL ENDOSCOPY  2008 RMR   SML HH o/w NL  . vocal cord polyp removal  2013   Dr. Josephina ShihEwain Wilson    Social History Social History  Substance Use Topics  . Smoking status: Former Smoker    Packs/day: 0.50    Years: 2.00    Types: Cigarettes    Quit date: 05/11/2015  . Smokeless tobacco: Never Used  . Alcohol use No    Allergies  Allergen Reactions  . Neurontin [Gabapentin] Other (See Comments)    Out of it and could  not function in this while on all the other bp meds.    Current Outpatient Prescriptions  Medication Sig Dispense Refill  . ALPRAZolam (XANAX) 0.5 MG tablet Take 1 tablet (0.5 mg total) by mouth 3 (three) times daily as needed for sleep or anxiety. 90 tablet 2  . aspirin 81 MG tablet Take 81 mg by mouth every morning.     Marland Kitchen. atorvastatin (LIPITOR) 10 MG tablet Take 1 tablet by mouth daily.    Marland Kitchen. buPROPion (WELLBUTRIN XL) 150 MG 24 hr tablet Take 2 tablets (300 mg total) by mouth every morning. 60 tablet 2  . Cholecalciferol (VITAMIN D  PO) Take 5,000 Units by mouth daily.    . Cyanocobalamin (VITAMIN B-12 PO) Take by mouth daily.    Marland Kitchen. DEXILANT 60 MG capsule TAKE 1 CAPSULE BY MOUTH ONCE A DAY. 30 capsule 11  . DULoxetine (CYMBALTA) 60 MG capsule Take 1 capsule (60 mg total) by mouth daily. 30 capsule 2  . fexofenadine (ALLEGRA) 180 MG tablet Take 180 mg by mouth every morning.     . fluconazole (DIFLUCAN) 100 MG tablet Take 1 tablet (100 mg total) by mouth daily. 10 tablet 0  . furosemide (LASIX) 20 MG tablet Take 20 mg by mouth 2 (two) times daily.      Marland Kitchen. ibuprofen (ADVIL,MOTRIN) 800 MG tablet Take 800 mg by mouth as needed for headache. Reported on 03/16/2016    . lidocaine (LIDODERM) 5 % Place 1 patch onto the skin every 12 (twelve) hours as needed (for pain). Reported on 03/16/2016    . Liraglutide (VICTOZA) 18 MG/3ML SOPN Inject 1.8 mg into the skin every evening.     Marland Kitchen. LYRICA 75 MG capsule Take 75 mg by mouth 2 (two) times daily.     . metFORMIN (GLUCOPHAGE-XR) 500 MG 24 hr tablet Take by mouth. Taking 2 Tablets Daily    . metoprolol (LOPRESSOR) 100 MG tablet Take 100 mg by mouth 2 (two) times daily.     . Multiple Vitamin (MULTIVITAMIN) capsule Take 1 capsule by mouth daily.      Marland Kitchen. nystatin-triamcinolone ointment (MYCOLOG) Apply 1 application topically 2 (two) times daily. 30 g 1  . oxycodone (ROXICODONE) 30 MG immediate release tablet Take 30 mg by mouth every 6 (six) hours as needed. For pain    . pantoprazole (PROTONIX) 40 MG tablet TAKE (1) TABLET BY MOUTH ONCE DAILY. 30 tablet 2  . Probiotic Product (PHILLIPS COLON HEALTH PO) Take 1 capsule by mouth daily.    . valsartan-hydrochlorothiazide (DIOVAN-HCT) 160-25 MG per tablet Take 1 tablet by mouth daily.    . VOLTAREN 1 % GEL Apply 2 g topically daily as needed (pain).     Marland Kitchen. zolpidem (AMBIEN CR) 12.5 MG CR tablet Take 1 tablet (12.5 mg total) by mouth at bedtime as needed for sleep. 30 tablet 2   No current facility-administered medications for this visit.        Physical Exam Physical Exam BP (!) 143/90   Ht 5' 10.5" (1.791 m)   Wt 206 lb (93.4 kg)   BMI 29.14 kg/m   Gen. appearance Normal medium frame mesomorphic body habitus The patient is alert and oriented person place and time Mood is normal affect is normal Ambulatory status without antalgic gait today  Exam of the right shoulder reveals an anterior scar over the lateral acromion. This is from previous surgery. She has tenderness in the front of the shoulder along the anterior joint line. Range of motion  is limited to 120 of flexion and the scapular plane. There is no instability in abduction external rotation of the rotator cuff is grade 5 strength skin is normal otherwise. Pulses in the right arm are normal color is good she has no lymphadenopathy in the axilla or supraclavicular region  Her neck is nontender on the midline and right side but tender on the left side consistent with her degenerative disc disease  As far as the opposite left shoulder goes she has full range of motion its free and easy without restriction there is no tenderness or swelling the shoulder is stable strength is normal skin is intact pulses are excellent sensation is normal and lymph nodes are benign  Data Reviewed Plain films of the shoulder revealed no abnormalities in I have personally reviewed these x-rays  Assessment    Rotator cuff syndrome right shoulder    Plan    Injection Physical therapy Exercises   Procedure note the subacromial injection shoulder RIGHT  Verbal consent was obtained to inject the  RIGHT   Shoulder  Timeout was completed to confirm the injection site is a subacromial space of the  RIGHT  shoulder   Medication used Depo-Medrol 40 mg and lidocaine 1% 3 cc  Anesthesia was provided by ethyl chloride  The injection was performed in the RIGHT  posterior subacromial space. After pinning the skin with alcohol and anesthetized the skin with ethyl chloride the  subacromial space was injected using a 20-gauge needle. There were no complications  Sterile dressing was applied.         Fuller Canada 07/02/2016, 4:05 PM

## 2016-07-02 NOTE — Progress Notes (Signed)
Addendum to the other note  The patient complained of pain over the A1 pulley with slight catching of the right thumb  She had tenderness over the A1 pulley grinding and popping of the right thumb and we gave her an injection for triggering  Right Trigger thumb injection Medication  1 mL of 40 mg Depo-Medrol  2 mL of 1% lidocaine plain  Ethyl chloride for anesthesia  Verbal consent was obtained timeout was taken to confirm the injection site as right thumb  Alcohol was used to prepare the skin along with ethyl chloride and then the injection was made at the A1 pulley there were no complications

## 2016-07-02 NOTE — Patient Instructions (Addendum)
You have received an injection of steroids into the joint. 15% of patients will have increased pain within the 24 hours postinjection.   This is transient and will go away.   We recommend that you use ice packs on the injection site for 20 minutes every 2 hours and extra strength Tylenol 2 tablets every 8 as needed until the pain resolves.  If you continue to have pain after taking the Tylenol and using the ice please call the office for further instructions.   Start therapy   Shoulder Pain The shoulder is the joint that connects your arms to your body. The bones that form the shoulder joint include the upper arm bone (humerus), the shoulder blade (scapula), and the collarbone (clavicle). The top of the humerus is shaped like a ball and fits into a rather flat socket on the scapula (glenoid cavity). A combination of muscles and strong, fibrous tissues that connect muscles to bones (tendons) support your shoulder joint and hold the ball in the socket. Small, fluid-filled sacs (bursae) are located in different areas of the joint. They act as cushions between the bones and the overlying soft tissues and help reduce friction between the gliding tendons and the bone as you move your arm. Your shoulder joint allows a wide range of motion in your arm. This range of motion allows you to do things like scratch your back or throw a ball. However, this range of motion also makes your shoulder more prone to pain from overuse and injury. Causes of shoulder pain can originate from both injury and overuse and usually can be grouped in the following four categories:  Redness, swelling, and pain (inflammation) of the tendon (tendinitis) or the bursae (bursitis).  Instability, such as a dislocation of the joint.  Inflammation of the joint (arthritis).  Broken bone (fracture). HOME CARE INSTRUCTIONS   Apply ice to the sore area.  Put ice in a plastic bag.  Place a towel between your skin and the  bag.  Leave the ice on for 15-20 minutes, 3-4 times per day for the first 2 days, or as directed by your health care provider.  Stop using cold packs if they do not help with the pain.  If you have a shoulder sling or immobilizer, wear it as long as your caregiver instructs. Only remove it to shower or bathe. Move your arm as little as possible, but keep your hand moving to prevent swelling.  Squeeze a soft ball or foam pad as much as possible to help prevent swelling.  Only take over-the-counter or prescription medicines for pain, discomfort, or fever as directed by your caregiver. SEEK MEDICAL CARE IF:   Your shoulder pain increases, or new pain develops in your arm, hand, or fingers.  Your hand or fingers become cold and numb.  Your pain is not relieved with medicines. SEEK IMMEDIATE MEDICAL CARE IF:   Your arm, hand, or fingers are numb or tingling.  Your arm, hand, or fingers are significantly swollen or turn white or blue. MAKE SURE YOU:   Understand these instructions.  Will watch your condition.  Will get help right away if you are not doing well or get worse.   This information is not intended to replace advice given to you by your health care provider. Make sure you discuss any questions you have with your health care provider.   Document Released: 08/15/2005 Document Revised: 11/26/2014 Document Reviewed: 02/28/2015 Elsevier Interactive Patient Education [employer]2016 [employer]

## 2016-07-03 ENCOUNTER — Ambulatory Visit (INDEPENDENT_AMBULATORY_CARE_PROVIDER_SITE_OTHER): Payer: Medicare Other | Admitting: Diagnostic Neuroimaging

## 2016-07-03 ENCOUNTER — Encounter: Payer: Self-pay | Admitting: Diagnostic Neuroimaging

## 2016-07-03 VITALS — BP 142/90 | HR 84 | Wt 208.4 lb

## 2016-07-03 DIAGNOSIS — G4486 Cervicogenic headache: Secondary | ICD-10-CM

## 2016-07-03 DIAGNOSIS — R51 Headache: Secondary | ICD-10-CM

## 2016-07-03 DIAGNOSIS — G243 Spasmodic torticollis: Secondary | ICD-10-CM | POA: Diagnosis not present

## 2016-07-03 DIAGNOSIS — G43009 Migraine without aura, not intractable, without status migrainosus: Secondary | ICD-10-CM

## 2016-07-03 NOTE — Progress Notes (Signed)
[facility]  PATIENT: [name] DOB: [birth year]  REFERRING CLINICIAN:  HISTORY FROM: patient  REASON FOR VISIT: follow up    HISTORICAL  CHIEF COMPLAINT:  Chief Complaint  Patient presents with  . Migraine    rm 6, "I ahve DJD, bulging disks in my back and neck.. Had MRI of neck last year. I get injections in my back, go to a pain clinic."  . Pain    last seen 08/2013 for migraines, "I think it's my neck pain that's causing my head to hurt. Headache medicines don't help."    HISTORY OF PRESENT ILLNESS:   UPDATE 07/03/16: Since last visit, patient has been going to [facility] pain clinic for neck and low back pain. Lost to follow up here. Previous botox injections for torticollis only had temporary relief. Also, her daughter passed away in 10-May-2017and now patient has to take care of her daughter's 2 surving saughter (age 11 and 74). Stress levels are very high.  UPDATE 09/17/13: And patient returns for further evaluation of headaches and increasing neck pain. Patient has headache associated with nausea, photophobia and blurred vision. She also has left-sided neck pain. She has felt her head turning towards the right sided tilting towards the right side. She has even resorted to using a neck tie, height around her head, attached to the wall and using this to stretch her neck towards the left side. Now patient tells me that she was diagnosed with torticollis several years ago and treated with Botox injections by a neurologist in [place], [place]. Patient had MRI of the neck last month which showed minimal disc bulging but no surgically treatable lesion.  UPDATE 03/03/12: Doing about the same. Lots of stress, still smoking. Tried TPX, but not helping.   PRIOR HPI: 50 year old female with history of depression, anxiety, fibromyalgia, hypertension, diabetes, here for evaluation of headaches, pain and eye twitching. For the past 3 months, patient has had right temporal  headaches, intermittent right occipital tenderness to palpation with radiation to the right eye, intermittent facial numbness, intermittent slurred speech, intermittent blurred vision and right eye twitching.  No nausea or vomiting.  She has some photophobia.  She has tried Maxalt without relief of symptoms.  She has daily symptoms without relief.  She does not take any medications specifically for these headache symptoms.  She is on Lyrica for fibromyalgia and Percocet for back pain.   REVIEW OF SYSTEMS: Full 14 system review of systems performed and negative with exception of: easy bruising dizziness headache joint pain joint swelling back pain walking depression anxiety restless legs.    ALLERGIES: Allergies  Allergen Reactions  . Neurontin [Gabapentin] Other (See Comments)    Out of it and could not function in this while on all the other bp meds.    HOME MEDICATIONS: Outpatient Medications Prior to Visit  Medication Sig Dispense Refill  . ALPRAZolam (XANAX) 0.5 MG tablet Take 1 tablet (0.5 mg total) by mouth 3 (three) times daily as needed for sleep or anxiety. 90 tablet 2  . aspirin 81 MG tablet Take 81 mg by mouth every morning.     Marland Kitchen atorvastatin (LIPITOR) 10 MG tablet Take 1 tablet by mouth daily.    Marland Kitchen buPROPion (WELLBUTRIN XL) 150 MG 24 hr tablet Take 2 tablets (300 mg total) by mouth every morning. 60 tablet 2  . Cholecalciferol (VITAMIN D PO) Take 5,000 Units by mouth daily.    . Cyanocobalamin (VITAMIN B-12 PO) Take  by mouth daily.    Marland Kitchen. DEXILANT 60 MG capsule TAKE 1 CAPSULE BY MOUTH ONCE A DAY. 30 capsule 11  . DULoxetine (CYMBALTA) 60 MG capsule Take 1 capsule (60 mg total) by mouth daily. 30 capsule 2  . fexofenadine (ALLEGRA) 180 MG tablet Take 180 mg by mouth every morning.     . fluconazole (DIFLUCAN) 100 MG tablet Take 1 tablet (100 mg total) by mouth daily. 10 tablet 0  . furosemide (LASIX) 20 MG tablet Take 20 mg by mouth 2 (two) times daily.      Marland Kitchen. ibuprofen  (ADVIL,MOTRIN) 800 MG tablet Take 800 mg by mouth as needed for headache. Reported on 03/16/2016    . lidocaine (LIDODERM) 5 % Place 1 patch onto the skin every 12 (twelve) hours as needed (for pain). Reported on 03/16/2016    . Liraglutide (VICTOZA) 18 MG/3ML SOPN Inject 1.8 mg into the skin every evening.     Marland Kitchen. LYRICA 75 MG capsule Take 75 mg by mouth 2 (two) times daily.     . metFORMIN (GLUCOPHAGE-XR) 500 MG 24 hr tablet Take by mouth. Taking 2 Tablets Daily    . metoprolol (LOPRESSOR) 100 MG tablet Take 100 mg by mouth 2 (two) times daily.     . Multiple Vitamin (MULTIVITAMIN) capsule Take 1 capsule by mouth daily.      Marland Kitchen. nystatin-triamcinolone ointment (MYCOLOG) Apply 1 application topically 2 (two) times daily. 30 g 1  . oxycodone (ROXICODONE) 30 MG immediate release tablet Take 30 mg by mouth every 6 (six) hours as needed. For pain    . pantoprazole (PROTONIX) 40 MG tablet TAKE (1) TABLET BY MOUTH ONCE DAILY. 30 tablet 2  . Probiotic Product (PHILLIPS COLON HEALTH PO) Take 1 capsule by mouth daily.    . valsartan-hydrochlorothiazide (DIOVAN-HCT) 160-25 MG per tablet Take 1 tablet by mouth daily.    . VOLTAREN 1 % GEL Apply 2 g topically daily as needed (pain).     Marland Kitchen. zolpidem (AMBIEN CR) 12.5 MG CR tablet Take 1 tablet (12.5 mg total) by mouth at bedtime as needed for sleep. (Patient not taking: Reported on 07/03/2016) 30 tablet 2   No facility-administered medications prior to visit.     PAST MEDICAL HISTORY: Past Medical History:  Diagnosis Date  . BMI (body mass index) 20.0-29.9 2008 192 lbs  . Complication of anesthesia    post op N/V  . Depression   . Diabetes mellitus   . GERD (gastroesophageal reflux disease) 2002   202 lbs  . Hypertension   . Irritable bowel syndrome 2008   diarrhea predominant MEDS TRIED: BENTYL, LEVBID  . LBP (low back pain)   . PONV (postoperative nausea and vomiting)   . PTSD (post-traumatic stress disorder)   . RUQ abdominal pain 2004 HIDA NL CTA w/  IVC-FATTY LIVER, ANGIOMYOLIPOMA LEFT KIDNEY   2008 HIDA NL    PAST SURGICAL HISTORY: Past Surgical History:  Procedure Laterality Date  . ANKLE SURGERY  RIGHT  . BREAST REDUCTION SURGERY  2006  . COLONOSCOPY  2008 RMR   PAN-COLONIC TICS, NL TI  . COLONOSCOPY  2013   Dr. Teena DunkBenson: diverticulum in ascending colon, mild diverticulosis in sigmoid. Recommended 5 year surveillance  . ESOPHAGEAL DILATION N/A 04/06/2015   Procedure: ESOPHAGEAL DILATION;  Surgeon: Corbin Adeobert M Rourk, MD;  [facility];  Service: Gastroenterology;  Laterality: N/A;  300  . ESOPHAGOGASTRODUODENOSCOPY N/A 04/06/2015   RMR: Normal EGD status post Maloney dilation of teh esophagus  as described above  . LAPAROSCOPIC BILATERAL SALPINGO OOPHERECTOMY Bilateral 11/22/2015   Procedure: LAPAROSCOPIC BILATERAL SALPINGO OOPHORECTOMY;  Surgeon: Tilda BurrowJohn Ferguson V, MD;  Location: [facility];  Service: Gynecology;  Laterality: Bilateral;  . LUMBAR DISC SURGERY  2005  . SHOULDER SURGERY  1997 RIGHT  . TOTAL ABDOMINAL HYSTERECTOMY  2000 fibroids/DUB  . UPPER GASTROINTESTINAL ENDOSCOPY  2008 RMR   SML HH o/w NL  . vocal cord polyp removal  2013   Dr. Josephina ShihEwain Wilson    FAMILY HISTORY: Family History  Problem Relation Age of Onset  . Breast cancer Maternal Aunt   . Anxiety disorder Maternal Aunt   . Anxiety disorder Mother   . Stroke Mother   . COPD Mother   . Hypertension Mother   . Thyroid disease Mother   . Diabetes Mother   . Drug abuse Daughter   . Anxiety disorder Daughter   . Seizures Sister   . Alcohol abuse Maternal Uncle   . Mental illness Cousin   . Dementia Maternal Aunt   . Anxiety disorder Sister   . Stroke Sister   . Anxiety disorder Sister   . Drug abuse Sister   . Early death Sister   . Heart disease Sister   . Heart attack Sister   . Stroke Maternal Grandmother   . Colon polyps Neg Hx   . Colon cancer Neg Hx   . ADD / ADHD Neg Hx   . Bipolar disorder Neg Hx   . Depression Neg Hx   . OCD Neg Hx     . Paranoid behavior Neg Hx   . Schizophrenia Neg Hx   . Sexual abuse Neg Hx   . Physical abuse Neg Hx     SOCIAL HISTORY:  Social History   Social History  . Marital status: Single    Spouse name: N/A  . Number of children: 3  . Years of education: 12th   Occupational History  .  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 Smoking status: Former Smoker    Packs/day: 0.50    Years: 2.00    Types: Cigarettes    Quit date: 05/11/2015  . Smokeless tobacco: Never Used  . Alcohol use No  . Drug use: No  . Sexual activity: Not on file   Other Topics Concern  . Not on file   Social History Narrative   Patient lives at home with her family. Raising two grand daughters, daughter passed away 03/20/16   Caffeine Use: 2 cups of coffee daily     PHYSICAL EXAM  GENERAL EXAM/CONSTITUTIONAL: Vitals:  Vitals:   07/03/16 1409  BP: (!) 142/90  Pulse: 84  Weight: 208 lb 6.4 oz (94.5 kg)     Body mass index is 29.48 kg/m.  No exam data present  Patient is in no distress; well developed, nourished and groomed  HEAD ROTATED RIGHT 5 DEGREES, HEAD TILTED RIGHT 5 DEGREES. CANNOT FULLY ROTATE HEAD TO THE LEFT PAST 45 DEGREES, DECR LEFT HEAD TILT. SUBJECTIVEPAIN IN LEFT TRAPEZIUS AND LEFT POSTERIOR-LATERAL NECK.  CARDIOVASCULAR:  Examination of carotid arteries is normal; no carotid bruits  Regular rate and rhythm, no murmurs  Examination of peripheral vascular system by observation and palpation is normal  EYES:  Ophthalmoscopic exam of optic discs and posterior segments is normal; no papilledema or hemorrhages  MUSCULOSKELETAL:  Gait, strength, tone, movements noted in Neurologic exam below  NEUROLOGIC: MENTAL STATUS:  No flowsheet data found.  awake, alert, oriented to person, place and time  recent and remote memory intact  normal attention and concentration  language fluent, comprehension intact, naming intact,   fund of knowledge  appropriate  CRANIAL NERVE:   2nd - no papilledema on fundoscopic exam  2nd, 3rd, 4th, 6th - pupils equal and reactive to light, visual fields full to confrontation, extraocular muscles intact, no nystagmus  5th - facial sensation symmetric  7th - facial strength symmetric  8th - hearing intact  9th - palate elevates symmetrically, uvula midline  11th - shoulder shrug symmetric  12th - tongue protrusion midline  MOTOR:   normal bulk and tone, full strength in the BUE, BLE  SENSORY:   normal and symmetric to light touch, temperature, vibration  COORDINATION:   finger-nose-finger, fine finger movements normal  REFLEXES:   deep tendon reflexes present and symmetric  GAIT/STATION:   narrow based gait; able to walk on toes, heels and tandem; romberg is negative    DIAGNOSTIC DATA (LABS, IMAGING, TESTING) - I reviewed patient records, labs, notes, testing and imaging myself where available.  Lab Results  Component Value Date   WBC 10.6 (H) 11/23/2015   HGB 11.3 (L) 11/23/2015   HCT 34.2 (L) 11/23/2015   MCV 93.2 11/23/2015   PLT 284 11/23/2015      Component Value Date/Time   NA 138 11/23/2015 0605   K 3.8 11/23/2015 0605   CL 101 11/23/2015 0605   CO2 31 11/23/2015 0605   GLUCOSE 275 (H) 11/23/2015 0605   BUN 13 11/23/2015 0605   CREATININE 1.00 11/23/2015 0605   CALCIUM 8.4 (L) 11/23/2015 0605   GFRNONAA >60 11/23/2015 0605   GFRAA >60 11/23/2015 2841   No results found for: CHOL, HDL, LDLCALC, LDLDIRECT, TRIG, CHOLHDL No results found for: LKGM0N No results found for: VITAMINB12 No results found for: TSH  04/12/15 MRI cervical spine [I reviewed images myself and agree with interpretation. -VRP]  - Mild bulging at C5-6. No change from the prior study. No disc protrusion or neural impingement identified.    ASSESSMENT AND PLAN  50 y.o. year old female here with fibromyalgia, neck pain, ow back pain, high stress levels, migraine headaches,  cervicogenic headaches, cervical dystonia.   Tried and failed: topiramate, maxalt, botox   Dx:  1. Cervicogenic headache   2. Cervical dystonia     PLAN: I spent 25 minutes of face to face time with patient. Greater than 50% of time was spent in counseling and coordination of care with patient. In summary we discussed:  - continue pain mgmt clinic treatment - consider acupuncture, physical therapy, yoga treatments  Return if symptoms worsen or fail to improve, for return to PCP.    Suanne Marker, MD 07/03/2016, 2:33 PM Certified in Neurology, Neurophysiology and Neuroimaging  [facility] [address] [place], [place] [address] [phone]

## 2016-07-03 NOTE — Patient Instructions (Signed)
-   continue physical therapy and medications  - consider water therapy  - consider yoga sessions

## 2016-07-06 ENCOUNTER — Ambulatory Visit (HOSPITAL_COMMUNITY): Payer: Medicare Other | Attending: Orthopedic Surgery | Admitting: Occupational Therapy

## 2016-07-06 ENCOUNTER — Encounter (HOSPITAL_COMMUNITY): Payer: Self-pay | Admitting: Occupational Therapy

## 2016-07-06 DIAGNOSIS — R29898 Other symptoms and signs involving the musculoskeletal system: Secondary | ICD-10-CM

## 2016-07-06 DIAGNOSIS — M25511 Pain in right shoulder: Secondary | ICD-10-CM | POA: Diagnosis not present

## 2016-07-06 NOTE — Therapy (Addendum)
[facility] [address] [place], [place], [address] Phone: [phone]   Fax:  [phone]  Occupational Therapy Evaluation  Patient Details  Name: [name] MRN: [id number] Date of Birth: [birth year] Referring Provider: Dr. Fuller CanadaStanley Harrison  Encounter Date: 07/06/2016      OT End of Session - 07/06/16 1344    Visit Number 1   Number of Visits 12   Date for OT Re-Evaluation 08/17/16   Authorization Type UHC Medicare/Medicaid secondary   Authorization Time Period Before 10th visit   Authorization - Visit Number 1   Authorization - Number of Visits 10   OT Start Time 1300   OT Stop Time 1331   OT Time Calculation (min) 31 min   Activity Tolerance Patient tolerated treatment well   Behavior During Therapy [facility] for tasks assessed/performed      Past Medical History:  Diagnosis Date  . BMI (body mass index) 20.0-29.9 2008 192 lbs  . Complication of anesthesia    post op N/V  . Depression   . Diabetes mellitus   . GERD (gastroesophageal reflux disease) 2002   202 lbs  . Hypertension   . Irritable bowel syndrome 2008   diarrhea predominant MEDS TRIED: BENTYL, LEVBID  . LBP (low back pain)   . PONV (postoperative nausea and vomiting)   . PTSD (post-traumatic stress disorder)   . RUQ abdominal pain 2004 HIDA NL CTA w/ IVC-FATTY LIVER, ANGIOMYOLIPOMA LEFT KIDNEY   2008 HIDA NL    Past Surgical History:  Procedure Laterality Date  . ANKLE SURGERY  RIGHT  . BREAST REDUCTION SURGERY  2006  . COLONOSCOPY  2008 RMR   PAN-COLONIC TICS, NL TI  . COLONOSCOPY  2013   Dr. Teena DunkBenson: diverticulum in ascending colon, mild diverticulosis in sigmoid. Recommended 5 year surveillance  . ESOPHAGEAL DILATION N/A 04/06/2015   Procedure: ESOPHAGEAL DILATION;  Surgeon: Corbin Adeobert M Rourk, MD;  [facility];  Service: Gastroenterology;  Laterality: N/A;  300  . ESOPHAGOGASTRODUODENOSCOPY N/A 04/06/2015   RMR: Normal EGD status post Maloney  dilation of teh esophagus as described above  . LAPAROSCOPIC BILATERAL SALPINGO OOPHERECTOMY Bilateral 11/22/2015   Procedure: LAPAROSCOPIC BILATERAL SALPINGO OOPHORECTOMY;  Surgeon: Tilda BurrowJohn Ferguson V, MD;  Location: [facility];  Service: Gynecology;  Laterality: Bilateral;  . LUMBAR DISC SURGERY  2005  . SHOULDER SURGERY  1997 RIGHT  . TOTAL ABDOMINAL HYSTERECTOMY  2000 fibroids/DUB  . UPPER GASTROINTESTINAL ENDOSCOPY  2008 RMR   SML HH o/w NL  . vocal cord polyp removal  2013   Dr. Josephina ShihEwain Wilson    There were no vitals filed for this visit.      Subjective Assessment - 07/06/16 1339    Subjective  S: I feel like someone is in there punching me.    Pertinent History Pt is a 50 y/o female presenting with right rotator cuff syndrome causing increased pain and difficulty completing daily tasks. Pt had two surgeries for torn bursa in the right shoulder in 1992 and reports she also attends a pain clinic for fibromyalgia. Dr. Fuller CanadaStanley Harrison referred to occupational therapy for evaluation and treatment.    Special Tests FOTO Score: 49/100 (51% impairment)   Patient Stated Goals To be able to use my arm as much as possible.    Currently in Pain? Yes   Pain Score 4    Pain Location Shoulder   Pain Orientation Right   Pain Descriptors / Indicators Aching;Throbbing   Pain Type  Acute pain   Pain Radiating Towards elbow   Pain Onset More than a month ago   Pain Frequency Constant   Aggravating Factors  movement   Pain Relieving Factors rest   Effect of Pain on Daily Activities limited use of RUE during daily tasks   Multiple Pain Sites No           [facility] OT Assessment - 07/06/16 1257      Assessment   Diagnosis Right rotator cuff syndrome   Referring Provider Dr. Fuller Canada   Onset Date 05/06/16   Prior Therapy PT after surgery in 1992     Precautions   Precautions None     Restrictions   Weight Bearing Restrictions No     Balance Screen   Has the patient fallen in the past 6  months No   Has the patient had a decrease in activity level because of a fear of falling?  No   Is the patient reluctant to leave their home because of a fear of falling?  No     Home  Environment   Family/patient expects to be discharged to: Private residence     Prior Function   Level of Independence Independent   Vocation On disability;Student  in school for surgical technician   Leisure writing, crocheting, braiding hair, spending time with granddaughters      ADL   ADL comments Pt is having difficulty with dressing, caregiving tasks, tying shoes, reaching into cabinets, lifting weighted objects     Written Expression   Dominant Hand Right     Cognition   Overall Cognitive Status Within Functional Limits for tasks assessed     ROM / Strength   AROM / PROM / Strength AROM;PROM;Strength     Palpation   Palpation comment Mod fascial restrictions in right upper arm, trapezius, and scapularis regions     AROM   Overall AROM Comments Assessed seated, ER/IR adducted   AROM Assessment Site Shoulder   Right/Left Shoulder Right   Right Shoulder Flexion 87 Degrees   Right Shoulder ABduction 107 Degrees   Right Shoulder Internal Rotation 90 Degrees   Right Shoulder External Rotation 56 Degrees     PROM   Overall PROM Comments Assessed supine, ER/IR adducted   PROM Assessment Site Shoulder   Right/Left Shoulder Right   Right Shoulder Flexion 111 Degrees   Right Shoulder ABduction 89 Degrees   Right Shoulder Internal Rotation 90 Degrees   Right Shoulder External Rotation 70 Degrees     Strength   Overall Strength Comments Assessed seated, ER/IR adducted   Strength Assessment Site Shoulder   Right/Left Shoulder Right   Right Shoulder Flexion 3/5   Right Shoulder ABduction 3/5   Right Shoulder Internal Rotation 3/5   Right Shoulder External Rotation 3/5                         OT Education - 07/06/16 1343    Education provided Yes   Education Details  table slides   Person(s) Educated Patient   Methods Explanation;Demonstration;Handout   Comprehension Verbalized understanding;Returned demonstration          OT Short Term Goals - 07/06/16 1349      OT SHORT TERM GOAL #1   Title Pt will be provided with and educated on HEP.    Time 6   Period Weeks   Status New     OT SHORT TERM GOAL #2  Title Pt will return to prior level of independence and functioning using RUE as dominant to improve ability to complete caregiving tasks.    Time 6   Period Weeks   Status New     OT SHORT TERM GOAL #3   Title Pt will decrease fascial restrictions in RUE from mod to min amounts or less to improve mobility in RUE.    Time 6   Period Weeks   Status New     OT SHORT TERM GOAL #4   Title Pt will decrease pain in RUE to 3/10 or less to improve ability to complete daily tasks using RUE as dominant.    Time 6   Period Weeks   Status New     OT SHORT TERM GOAL #5   Title Pt will increase A/ROM to [facility] to improve ability to reaching up and into cabinets.    Time 6   Period Weeks   Status New     Additional Short Term Goals   Additional Short Term Goals Yes     OT SHORT TERM GOAL #6   Title Pt will increase RUE strength to 4/5 to improve ability to perform hair braiding tasks.    Time 6   Period Weeks   Status New                  Plan - 07/06/16 1346    Clinical Impression Statement A: Pt is a 50 y/o female presenting with increased pain and fascial restrictions, decreased range of motion and strength in the RUE, limiting functional use of the RUE as dominant. Pt provided with and educated on table slides for HEP.    Rehab Potential Good   OT Frequency 2x / week   OT Duration 6 weeks   OT Treatment/Interventions Self-care/ADL training;Therapeutic exercise;Patient/family education;Ultrasound;Manual Therapy;Cryotherapy;Therapeutic activities;Electrical Stimulation;Moist Heat;Passive range of motion   Plan P: Pt will benefit  from skilled OT services to decrease pain and fascial restrictions, increase range of motion and strength in RUE, increasing functional use of RUE during daily tasks. Treatment plan: Myofascial release, manual therapy, P/ROM, AA/ROM, A/ROM, general RUE strengthening, scapular strengthening and stabilization, modalities as needed.    OT Home Exercise Plan table slides   Consulted and Agree with Plan of Care Patient      Patient will benefit from skilled therapeutic intervention in order to improve the following deficits and impairments:  Decreased strength, Impaired flexibility, Decreased activity tolerance, Decreased range of motion, Increased fascial restricitons, Impaired UE functional use, Pain  Visit Diagnosis: Pain in right shoulder  Other symptoms and signs involving the musculoskeletal system      G-Codes - 07/06/16 1352    Functional Assessment Tool Used FOTO Score: 49/100 (51% impairment)   Functional Limitation Carrying, moving and handling objects   Carrying, Moving and Handling Objects Current Status ([id number]) At least 40 percent but less than 60 percent impaired, limited or restricted   Carrying, Moving and Handling Objects Goal Status (U0454(G8985) At least 20 percent but less than 40 percent impaired, limited or restricted          Problem List Patient Active Problem List   Diagnosis Date Noted  . Postop check 12/02/2015  . Pelvic peritoneal adhesions, female,postsurgical 11/22/2015  . Chronic pelvic pain in female 11/22/2015  . Tubo-ovarian adhesions postsurgical 11/22/2015  . Postoperative female pelvic peritoneal adhesions 11/22/2015  . Para-ovarian adhesion left 11/17/2015  . Globus sensation 05/18/2015  . Dysphagia, pharyngoesophageal phase 03/29/2015  .  Perimenopausal vasomotor symptoms 09/16/2014  . Torticollis 09/16/2013  . Migraine without aura 09/16/2013  . Torticollis, spasmodic 05/26/2013  . Rotator cuff syndrome of right shoulder 05/26/2013  . Pain in  joint, upper arm 05/26/2013  . Dyspepsia 03/17/2013  . Dysthymia 01/30/2013  . Pain syndrome, chronic 01/30/2013  . Arthritis of knee 06/21/2011  . Pain in left knee 06/21/2011  . IBS (irritable bowel syndrome) 03/29/2011  . FATIGUE 12/12/2010  . DYSPNEA ON EXERTION 12/12/2010  . GERD 10/21/2008  . Palpitations 10/21/2008  . MURMUR 10/21/2008  . SUBLUXATION PATELLAR (MALALIGNMENT) 04/01/2008  . KNEE PAIN 01/05/2008  . BACK PAIN 01/05/2008     Ezra Sites, OTR/L  [phone] 07/06/2016, 1:54 PM  [place] [facility] [address] [place], [place], [address] Phone: [phone]   Fax:  [phone]  Name: [name] MRN: [id number] Date of Birth: 14-Apr-1966

## 2016-07-06 NOTE — Patient Instructions (Signed)
SHOULDER: Flexion On Table   Place hands on table, elbows straight. Move hips away from body. Press hands down into table.  _10-15__ reps per set, _1-2__ sets per day  Abduction (Passive)   With arm out to side, resting on table, lower head toward arm, keeping trunk away from table.  Repeat _10-15___ times. Do _1-2___ sessions per day.  Copyright  VHI. All rights reserved.     Internal Rotation (Assistive)   Seated with elbow bent at right angle and held against side, slide arm on table surface in an inward arc. Repeat __10-15__ times. Do _1-2___ sessions per day. Activity: Use this motion to brush crumbs off the table.  Copyright  VHI. All rights reserved.    

## 2016-07-10 ENCOUNTER — Telehealth (HOSPITAL_COMMUNITY): Payer: Self-pay

## 2016-07-10 ENCOUNTER — Ambulatory Visit (HOSPITAL_COMMUNITY): Payer: Medicare Other

## 2016-07-10 NOTE — Telephone Encounter (Signed)
She is sick and can not come in today °

## 2016-07-12 ENCOUNTER — Encounter (HOSPITAL_COMMUNITY): Payer: Self-pay | Admitting: Psychiatry

## 2016-07-12 ENCOUNTER — Ambulatory Visit (INDEPENDENT_AMBULATORY_CARE_PROVIDER_SITE_OTHER): Payer: Medicare Other | Admitting: Psychiatry

## 2016-07-12 ENCOUNTER — Ambulatory Visit (HOSPITAL_COMMUNITY): Payer: Medicare Other

## 2016-07-12 ENCOUNTER — Telehealth (HOSPITAL_COMMUNITY): Payer: Self-pay

## 2016-07-12 VITALS — BP 137/103 | HR 81 | Ht 70.5 in | Wt 206.0 lb

## 2016-07-12 DIAGNOSIS — F332 Major depressive disorder, recurrent severe without psychotic features: Secondary | ICD-10-CM | POA: Diagnosis not present

## 2016-07-12 MED ORDER — DULOXETINE HCL 60 MG PO CPEP: 60.0000 mg | ORAL_CAPSULE | Freq: Every day | ORAL | 2 refills | Status: DC

## 2016-07-12 MED ORDER — BUPROPION HCL ER (XL) 150 MG PO TB24: 300.0000 mg | ORAL_TABLET | ORAL | 2 refills | Status: DC

## 2016-07-12 MED ORDER — ALPRAZOLAM 0.5 MG PO TABS: 0.5000 mg | ORAL_TABLET | Freq: Three times a day (TID) | ORAL | 2 refills | Status: AC | PRN

## 2016-07-12 MED ORDER — ZOLPIDEM TARTRATE ER 12.5 MG PO TBCR: 12.5000 mg | EXTENDED_RELEASE_TABLET | Freq: Every evening | ORAL | 2 refills | Status: DC | PRN
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
## 2016-07-12 NOTE — Telephone Encounter (Signed)
Called patient regarding no show and to remind patient of her next appointment and to please call if she is unable to attend.   Limmie PatriciaLaura Earley Grobe, OTR/L,CBIS  [phone]

## 2016-07-16 ENCOUNTER — Ambulatory Visit (HOSPITAL_COMMUNITY): Payer: Medicare Other | Admitting: Specialist

## 2016-07-16 DIAGNOSIS — M25511 Pain in right shoulder: Secondary | ICD-10-CM | POA: Diagnosis not present

## 2016-07-16 DIAGNOSIS — R29898 Other symptoms and signs involving the musculoskeletal system: Secondary | ICD-10-CM

## 2016-07-16 NOTE — Patient Instructions (Signed)
Perform each exercise ___10_____ reps. 2-3x days.   Protraction - lying down  Start by holding a wand or cane at chest height.  Next, slowly push the wand outwards in front of your body so that your elbows become fully straightened. Then, return to the original position.     Shoulder FLEXION - lying down - PALMS UP  In the standing position, hold a wand/cane with both arms, palms up on both sides. Raise up the wand/cane allowing your unaffected arm to perform most of the effort. Your affected arm should be partially relaxed.      Internal/External ROTATION - lying down In the standing position, hold a wand/cane with both hands keeping your elbows bent. Move your arms and wand/cane to one side.  Your affected arm should be partially relaxed while your unaffected arm performs most of the effort.       Shoulder ABDUCTION - lying down While holding a wand/cane palm face up on the injured side and palm face down on the uninjured side, slowly raise up your injured arm to the side.                     Horizontal Abduction/Adduction      Straight arms holding cane at shoulder height, bring cane to right, center, left. Repeat starting to left.   Copyright  VHI. All rights reserved.      

## 2016-07-16 NOTE — Therapy (Signed)
[place], [place], [address] Phone: [phone]   Fax:  [phone]  Occupational Therapy Treatment  Patient Details  Name: [name] MRN: [id number] Date of Birth: 02-26-1966 Referring Provider: Dr. Arther Abbott  Encounter Date: 07/16/2016      OT End of Session - 07/16/16 1404    Visit Number 2   Number of Visits 12   Date for OT Re-Evaluation 08/17/16   Authorization Type UHC Medicare/Medicaid secondary   Authorization Time Period Before 10th visit   Authorization - Visit Number 2   Authorization - Number of Visits 10   OT Start Time 9407   OT Stop Time 1351   OT Time Calculation (min) 44 min   Activity Tolerance Patient tolerated treatment well   Behavior During Therapy [facility] for tasks assessed/performed      Past Medical History:  Diagnosis Date  . BMI (body mass index) 20.0-29.9 2008 192 lbs  . Complication of anesthesia    post op N/V  . Depression   . Diabetes mellitus   . GERD (gastroesophageal reflux disease) 2002   202 lbs  . Hypertension   . Irritable bowel syndrome 2008   diarrhea predominant MEDS TRIED: BENTYL, LEVBID  . LBP (low back pain)   . PONV (postoperative nausea and vomiting)   . PTSD (post-traumatic stress disorder)   . RUQ abdominal pain 2004 HIDA NL CTA w/ IVC-FATTY LIVER, ANGIOMYOLIPOMA LEFT KIDNEY   2008 HIDA NL    Past Surgical History:  Procedure Laterality Date  . ANKLE SURGERY  RIGHT  . BREAST REDUCTION SURGERY  2006  . COLONOSCOPY  2008 RMR   PAN-COLONIC TICS, NL TI  . COLONOSCOPY  2013   Dr. Britta Mccreedy: diverticulum in ascending colon, mild diverticulosis in sigmoid. Recommended 5 year surveillance  . ESOPHAGEAL DILATION N/A 04/06/2015   Procedure: ESOPHAGEAL DILATION;  Surgeon: Daneil Dolin, MD;  Location: [facility] ENDO SUITE;  Service: Gastroenterology;  Laterality: N/A;  300  . ESOPHAGOGASTRODUODENOSCOPY N/A 04/06/2015   RMR: Normal EGD status post [place]  dilation of teh esophagus as described above  . LAPAROSCOPIC BILATERAL SALPINGO OOPHERECTOMY Bilateral 11/22/2015   Procedure: LAPAROSCOPIC BILATERAL SALPINGO OOPHORECTOMY;  Surgeon: Jonnie Kind, MD;  Location: [facility] ORS;  Service: Gynecology;  Laterality: Bilateral;  . [place] SURGERY  2005  . SHOULDER SURGERY  1997 RIGHT  . TOTAL ABDOMINAL HYSTERECTOMY  2000 fibroids/DUB  . UPPER GASTROINTESTINAL ENDOSCOPY  2008 RMR   SML HH o/w NL  . vocal cord polyp removal  2013   Dr. Adriana Reams    There were no vitals filed for this visit.      Subjective Assessment - 07/16/16 1310    Subjective  S:  My shoulder is still sore.    Currently in Pain? Yes   Pain Score 8    Pain Location Shoulder   Pain Orientation Right   Pain Descriptors / Indicators Aching;Throbbing   Pain Type Acute pain   Pain Radiating Towards elbow   Pain Onset More than a month ago   Pain Frequency Constant   Aggravating Factors  movement   Pain Relieving Factors rest   Effect of Pain on Daily Activities limited use of RUE            [facility] OT Assessment - 07/16/16 0001      Assessment   Diagnosis Right rotator cuff syndrome     Precautions   Precautions None  OT Treatments/Exercises (OP) - 07/16/16 0001      Exercises   Exercises Shoulder     Shoulder Exercises: Supine   Protraction PROM;AAROM;10 reps   Horizontal ABduction PROM;AAROM;10 reps   External Rotation PROM;AAROM;10 reps   Internal Rotation PROM;AAROM;10 reps   Flexion PROM;AAROM;10 reps   ABduction PROM;AAROM;10 reps     Shoulder Exercises: Seated   Elevation AROM;10 reps   Extension AROM;10 reps   Row AROM;10 reps     Shoulder Exercises: Therapy Ball   Flexion 10 reps   ABduction 10 reps     Manual Therapy   Manual Therapy Myofascial release   Manual therapy comments manual therapy completed seperately from all other interventions this date of service   Myofascial Release myofascial release and  manual stretching to right upper arm, scapular, shoulder region and associated areas to decrease pain and restrictions and improve pain free mobility in right shoulder.                  OT Education - 07/16/16 1402    Education provided Yes   Education Details reviewed treatment plan this date and issued patient a copy.  Issued HEP for AA/ROM in supine for shoulder flexion, abduction, external rotation, internal rotation, horizontal abduction, and protraction.     Person(s) Educated Patient   Methods Explanation;Demonstration;Handout   Comprehension Verbalized understanding          OT Short Term Goals - 07/16/16 1406      OT SHORT TERM GOAL #1   Title Pt will be provided with and educated on HEP.    Time 6   Period Weeks   Status On-going     OT SHORT TERM GOAL #2   Title Pt will return to prior level of independence and functioning using RUE as dominant to improve ability to complete caregiving tasks.    Time 6   Period Weeks   Status On-going     OT SHORT TERM GOAL #3   Title Pt will decrease fascial restrictions in RUE from mod to min amounts or less to improve mobility in RUE.    Time 6   Period Weeks   Status On-going     OT SHORT TERM GOAL #4   Title Pt will decrease pain in RUE to 3/10 or less to improve ability to complete daily tasks using RUE as dominant.    Time 6   Period Weeks   Status On-going     OT SHORT TERM GOAL #5   Title Pt will increase A/ROM to [facility] to improve ability to reaching up and into cabinets.    Time 6   Period Weeks   Status On-going     OT SHORT TERM GOAL #6   Title Pt will increase RUE strength to 4/5 to improve ability to perform hair braiding tasks.    Time 6   Period Weeks   Status On-going                  Plan - 07/16/16 1406    Clinical Impression Statement A: Patient able to tolerate 75% or greater P/ROM of her right shoulder this date. Initiated AA/ROM in supine and issued these exercises as HEP. Noted  popping with AA/ROM and P/ROM in right shoulder   Plan P:  increase repetitions with AA/ROM in supine, add thumb tacks, prot/ret/elev/dep and attempt AA/ROM in seated as tolerated.    Consulted and Agree with Plan of Care Patient  Patient will benefit from skilled therapeutic intervention in order to improve the following deficits and impairments:  Decreased strength, Impaired flexibility, Decreased activity tolerance, Decreased range of motion, Increased fascial restricitons, Impaired UE functional use, Pain  Visit Diagnosis: Pain in right shoulder  Other symptoms and signs involving the musculoskeletal system    Problem List Patient Active Problem List   Diagnosis Date Noted  . Postop check 12/02/2015  . Pelvic peritoneal adhesions, female,postsurgical 11/22/2015  . Chronic pelvic pain in female 11/22/2015  . Tubo-ovarian adhesions postsurgical 11/22/2015  . Postoperative female pelvic peritoneal adhesions 11/22/2015  . Para-ovarian adhesion left 11/17/2015  . Globus sensation 05/18/2015  . Dysphagia, pharyngoesophageal phase 03/29/2015  . Perimenopausal vasomotor symptoms 09/16/2014  . Torticollis 09/16/2013  . Migraine without aura 09/16/2013  . Torticollis, spasmodic 05/26/2013  . Rotator cuff syndrome of right shoulder 05/26/2013  . Pain in joint, upper arm 05/26/2013  . Dyspepsia 03/17/2013  . Dysthymia 01/30/2013  . Pain syndrome, chronic 01/30/2013  . Arthritis of knee 06/21/2011  . Pain in left knee 06/21/2011  . IBS (irritable bowel syndrome) 03/29/2011  . FATIGUE 12/12/2010  . DYSPNEA ON EXERTION 12/12/2010  . GERD 10/21/2008  . Palpitations 10/21/2008  . MURMUR 10/21/2008  . SUBLUXATION PATELLAR (MALALIGNMENT) 04/01/2008  . KNEE PAIN 01/05/2008  . BACK PAIN 01/05/2008    Vangie Bicker, [place], OTR/L [phone]  07/16/2016, 2:08 PM  [place], [place], [address] Phone:  [phone]   Fax:  [phone]  Name: ASAKO SALIBA MRN: [id number] Date of Birth: [birth year]

## 2016-07-18 ENCOUNTER — Telehealth (HOSPITAL_COMMUNITY): Payer: Self-pay

## 2016-07-18 ENCOUNTER — Ambulatory Visit (HOSPITAL_COMMUNITY): Payer: Medicare Other | Admitting: Occupational Therapy

## 2016-07-18 NOTE — Telephone Encounter (Signed)
8/30 pt left a message that she has her daughter at the [place] and they have been running behind so she won't be able to make it today

## 2016-07-19 ENCOUNTER — Other Ambulatory Visit (HOSPITAL_COMMUNITY): Payer: Self-pay | Admitting: Psychiatry

## 2016-07-25 ENCOUNTER — Ambulatory Visit (HOSPITAL_COMMUNITY): Payer: Medicare Other | Attending: Orthopedic Surgery | Admitting: Occupational Therapy

## 2016-07-25 ENCOUNTER — Telehealth (HOSPITAL_COMMUNITY): Payer: Self-pay | Admitting: Occupational Therapy

## 2016-07-25 DIAGNOSIS — M25511 Pain in right shoulder: Secondary | ICD-10-CM | POA: Insufficient documentation

## 2016-07-25 DIAGNOSIS — R29898 Other symptoms and signs involving the musculoskeletal system: Secondary | ICD-10-CM | POA: Insufficient documentation

## 2016-07-25 NOTE — Telephone Encounter (Signed)
Called pt regarding no-show for 07/25/16. Pt reports she has been at her [place] in [place] since 11:15 this morning. Reminded pt of next appt on Friday at 1:00, asked pt to call if unable to attend.    Ezra SitesLeslie Jeanclaude Wentworth, OTR/L  [phone] 07/25/2016

## 2016-07-27 ENCOUNTER — Ambulatory Visit (HOSPITAL_COMMUNITY): Payer: Medicare Other | Admitting: Occupational Therapy

## 2016-07-27 ENCOUNTER — Encounter (HOSPITAL_COMMUNITY): Payer: Self-pay | Admitting: Occupational Therapy

## 2016-07-27 DIAGNOSIS — M25511 Pain in right shoulder: Secondary | ICD-10-CM | POA: Diagnosis not present

## 2016-07-27 DIAGNOSIS — R29898 Other symptoms and signs involving the musculoskeletal system: Secondary | ICD-10-CM

## 2016-07-27 NOTE — Patient Instructions (Signed)
Perform each exercise ___15_____ reps. 1-3x days.   Protraction - STANDING  Start by holding a wand or cane at chest height.  Next, slowly push the wand outwards in front of your body so that your elbows become fully straightened. Then, return to the original position.     Shoulder FLEXION - STANDING - PALMS UP  In the standing position, hold a wand/cane with both arms, palms up on both sides. Raise up the wand/cane allowing your unaffected arm to perform most of the effort. Your affected arm should be partially relaxed.      Internal/External ROTATION - STANDING  In the standing position, hold a wand/cane with both hands keeping your elbows bent. Move your arms and wand/cane to one side.  Your affected arm should be partially relaxed while your unaffected arm performs most of the effort.       Shoulder ABDUCTION - STANDING  While holding a wand/cane palm face up on the injured side and palm face down on the uninjured side, slowly raise up your injured arm to the side.                     Horizontal Abduction/Adduction      Straight arms holding cane at shoulder height, bring cane to right, center, left. Repeat starting to left.   Copyright  VHI. All rights reserved.

## 2016-07-27 NOTE — Therapy (Signed)
[place], [place], [address] Phone: [phone]   Fax:  [phone]  Occupational Therapy Treatment  Patient Details  Name: [name] MRN: [id number] Date of Birth: 17-Dec-1965 Referring Provider: Dr. Arther Abbott  Encounter Date: 07/27/2016      OT End of Session - 07/27/16 1347    Visit Number 3   Number of Visits 12   Date for OT Re-Evaluation 08/17/16   Authorization Type UHC Medicare/Medicaid secondary   Authorization Time Period Before 10th visit   Authorization - Visit Number 3   Authorization - Number of Visits 10   OT Start Time 1301   OT Stop Time 1344   OT Time Calculation (min) 43 min   Activity Tolerance Patient tolerated treatment well   Behavior During Therapy [facility] for tasks assessed/performed      Past Medical History:  Diagnosis Date  . BMI (body mass index) 20.0-29.9 2008 192 lbs  . Complication of anesthesia    post op N/V  . Depression   . Diabetes mellitus   . GERD (gastroesophageal reflux disease) 2002   202 lbs  . Hypertension   . Irritable bowel syndrome 2008   diarrhea predominant MEDS TRIED: BENTYL, LEVBID  . LBP (low back pain)   . PONV (postoperative nausea and vomiting)   . PTSD (post-traumatic stress disorder)   . RUQ abdominal pain 2004 HIDA NL CTA w/ IVC-FATTY LIVER, ANGIOMYOLIPOMA LEFT KIDNEY   2008 HIDA NL    Past Surgical History:  Procedure Laterality Date  . ANKLE SURGERY  RIGHT  . BREAST REDUCTION SURGERY  2006  . COLONOSCOPY  2008 RMR   PAN-COLONIC TICS, NL TI  . COLONOSCOPY  2013   Dr. Britta Mccreedy: diverticulum in ascending colon, mild diverticulosis in sigmoid. Recommended 5 year surveillance  . ESOPHAGEAL DILATION N/A 04/06/2015   Procedure: ESOPHAGEAL DILATION;  Surgeon: Daneil Dolin, MD;  Location: [facility] ENDO SUITE;  Service: Gastroenterology;  Laterality: N/A;  300  . ESOPHAGOGASTRODUODENOSCOPY N/A 04/06/2015   RMR: Normal EGD status post Maloney  dilation of teh esophagus as described above  . LAPAROSCOPIC BILATERAL SALPINGO OOPHERECTOMY Bilateral 11/22/2015   Procedure: LAPAROSCOPIC BILATERAL SALPINGO OOPHORECTOMY;  Surgeon: Jonnie Kind, MD;  Location: [facility] ORS;  Service: Gynecology;  Laterality: Bilateral;  . [place] SURGERY  2005  . SHOULDER SURGERY  1997 RIGHT  . TOTAL ABDOMINAL HYSTERECTOMY  2000 fibroids/DUB  . UPPER GASTROINTESTINAL ENDOSCOPY  2008 RMR   SML HH o/w NL  . vocal cord polyp removal  2013   Dr. Adriana Reams    There were no vitals filed for this visit.      Subjective Assessment - 07/27/16 1303    Subjective  S: My arm feels better but my neck and leg don't.    Currently in Pain? Yes   Pain Score 4   with movement   Pain Orientation Right   Pain Descriptors / Indicators Sore   Pain Type Acute pain   Pain Radiating Towards n/a   Pain Onset More than a month ago   Pain Frequency Constant   Aggravating Factors  movement   Pain Relieving Factors rest   Effect of Pain on Daily Activities limited use of RUE during daily tasks   Multiple Pain Sites No            [facility] OT Assessment - 07/27/16 1303      Assessment   Diagnosis Right rotator cuff syndrome  Precautions   Precautions None                  OT Treatments/Exercises (OP) - 07/27/16 1305      Exercises   Exercises Shoulder     Shoulder Exercises: Supine   Protraction PROM;5 reps;AAROM;15 reps   Horizontal ABduction PROM;5 reps;AAROM;15 reps   External Rotation PROM;5 reps;AAROM;15 reps   Internal Rotation PROM;5 reps;AAROM;15 reps   Flexion PROM;5 reps;AAROM;15 reps   ABduction PROM;5 reps;AAROM;15 reps     Shoulder Exercises: Seated   Protraction AAROM;10 reps   Horizontal ABduction AAROM;10 reps   External Rotation AAROM;10 reps   Internal Rotation AAROM;10 reps   Flexion AAROM;10 reps   Abduction AAROM;10 reps     Shoulder Exercises: ROM/Strengthening   Thumb Tacks 1'   Proximal Shoulder  Strengthening, Supine 10X each no rest breaks   Prot/Ret//Elev/Dep 1'     Manual Therapy   Manual Therapy Myofascial release   Manual therapy comments manual therapy completed seperately from all other interventions this date of service   Myofascial Release myofascial release and manual stretching to right upper arm, scapular, shoulder region and associated areas to decrease pain and restrictions and improve pain free mobility in right shoulder.                  OT Education - 07/27/16 1338    Education provided Yes   Education Details Additional copy of AA/ROM provided this session.    Person(s) Educated Patient   Methods Demonstration;Explanation;Handout   Comprehension Verbalized understanding;Returned demonstration          OT Short Term Goals - 07/16/16 1406      OT SHORT TERM GOAL #1   Title Pt will be provided with and educated on HEP.    Time 6   Period Weeks   Status On-going     OT SHORT TERM GOAL #2   Title Pt will return to prior level of independence and functioning using RUE as dominant to improve ability to complete caregiving tasks.    Time 6   Period Weeks   Status On-going     OT SHORT TERM GOAL #3   Title Pt will decrease fascial restrictions in RUE from mod to min amounts or less to improve mobility in RUE.    Time 6   Period Weeks   Status On-going     OT SHORT TERM GOAL #4   Title Pt will decrease pain in RUE to 3/10 or less to improve ability to complete daily tasks using RUE as dominant.    Time 6   Period Weeks   Status On-going     OT SHORT TERM GOAL #5   Title Pt will increase A/ROM to [facility] to improve ability to reaching up and into cabinets.    Time 6   Period Weeks   Status On-going     OT SHORT TERM GOAL #6   Title Pt will increase RUE strength to 4/5 to improve ability to perform hair braiding tasks.    Time 6   Period Weeks   Status On-going                  Plan - 07/27/16 1347    Clinical Impression  Statement A: Added AA/ROM in sitting, thumb tacks, prot/ret/elev/dep, and increased repetitions for AA/ROM in supine. Pt reports no throbbing pain since last visit, also has full P/ROM and A/ROM [facility] this session. Verbal cuing required during session  for form and technique.    Rehab Potential Good   OT Frequency 2x / week   OT Duration 6 weeks   OT Treatment/Interventions Self-care/ADL training;Therapeutic exercise;Patient/family education;Ultrasound;Manual Therapy;Cryotherapy;Therapeutic activities;Electrical Stimulation;Moist Heat;Passive range of motion   Plan P: Attempt A/ROM in supine, add scapular theraband for improved scapular stability   OT Home Exercise Plan 9/8: AA/ROM   Consulted and Agree with Plan of Care Patient      Patient will benefit from skilled therapeutic intervention in order to improve the following deficits and impairments:  Decreased strength, Impaired flexibility, Decreased activity tolerance, Decreased range of motion, Increased fascial restricitons, Impaired UE functional use, Pain  Visit Diagnosis: Pain in right shoulder  Other symptoms and signs involving the musculoskeletal system    Problem List Patient Active Problem List   Diagnosis Date Noted  . Postop check 12/02/2015  . Pelvic peritoneal adhesions, female,postsurgical 11/22/2015  . Chronic pelvic pain in female 11/22/2015  . Tubo-ovarian adhesions postsurgical 11/22/2015  . Postoperative female pelvic peritoneal adhesions 11/22/2015  . Para-ovarian adhesion left 11/17/2015  . Globus sensation 05/18/2015  . Dysphagia, pharyngoesophageal phase 03/29/2015  . Perimenopausal vasomotor symptoms 09/16/2014  . Torticollis 09/16/2013  . Migraine without aura 09/16/2013  . Torticollis, spasmodic 05/26/2013  . Rotator cuff syndrome of right shoulder 05/26/2013  . Pain in joint, upper arm 05/26/2013  . Dyspepsia 03/17/2013  . Dysthymia 01/30/2013  . Pain syndrome, chronic 01/30/2013  . Arthritis of  knee 06/21/2011  . Pain in left knee 06/21/2011  . IBS (irritable bowel syndrome) 03/29/2011  . FATIGUE 12/12/2010  . DYSPNEA ON EXERTION 12/12/2010  . GERD 10/21/2008  . Palpitations 10/21/2008  . MURMUR 10/21/2008  . SUBLUXATION PATELLAR (MALALIGNMENT) 04/01/2008  . KNEE PAIN 01/05/2008  . BACK PAIN 01/05/2008   Guadelupe Sabin, OTR/L  [phone] 07/27/2016, 1:49 PM  [place] [address] [place], [place], [address] Phone: [phone]   Fax:  [phone]  Name: [name] MRN: [id number] Date of Birth: December 02, 1965

## 2016-07-31 ENCOUNTER — Ambulatory Visit (HOSPITAL_COMMUNITY): Payer: Medicare Other | Admitting: Occupational Therapy

## 2016-08-01 ENCOUNTER — Encounter (HOSPITAL_COMMUNITY): Payer: Self-pay | Admitting: Psychology

## 2016-08-01 NOTE — Progress Notes (Signed)
   Patient:  [name]   DOB: 11/21/1965  MR Number: [id number]  [facility] [address] [place] [id number] Dept: [phone]  Start: 1 PM End: 2 PM  Provider/Observer:     Hershal CoriaJohn R Ignacio Lowder PSYD  Chief Complaint:      Chief Complaint  Patient presents with  . Anxiety  . Depression    Reason For Service:     The patient was referred because of increasing symptoms of depression. Relationship issues, financial problems, medical issues, and loss of her brother in 2002 were all problematic situations for her to deal with. Most recently, major conflicts between her, her knees, and her daughter have also been quite disruptive. At this point, the patient has continued to struggle with severe back issues along with severe depression.  Interventions Strategy:  Cognitive/behavioral psychotherapy  Participation Level:   Active  Participation Quality:  Appropriate      Behavioral Observation:  Well Groomed, Alert, and Appropriate.   Current Psychosocial Factors: The patient reports that He has continued to have to deal with him cope with the death of her daughter. The patient reports that she has been doing better with this issue except family members have been getting involved in her situation in inappropriate ways..    Content of Session:   Reviewed current symptoms and continued work on therapeutic interventions  Current Status:   The patient reports that she has been very stressed but coping with loss of her daughter.  She reports that she has been actively working on the coping strategies we have been developing and is been managing her although she has been stressing about various family members and how they have gotten involved in her daughter's death/funeral.  Patient Progress:   Stable  Target Goals:   Target goals include reducing symptoms of depression, anxiety, and  increase coping skills.  Last Reviewed:   04/24/2016  Goals Addressed Today:    Today we worked on adjusted issues with the dissolution of her relationship with "PJ"  Impression/Diagnosis:   The patient has had significant psychosocial stressors throughout her life going on going back to childhood. Most recently she has been taking care of her g as well as her sister's children. Her sister died several years ago and she has raised her sister's children. She also adopted one of her sister's children her. The patient has her own daughter who has that was serious and significant substance abuse. The patient has recurrent major depressive events.randmother who had a severe stroke  Diagnosis:    Axis I:  1. Major depressive disorder, recurrent, severe without psychotic features (HCC)    2. Pain syndrome, chronic          Axis II: No diagnosis

## 2016-08-02 ENCOUNTER — Encounter (HOSPITAL_COMMUNITY): Payer: Self-pay | Admitting: Occupational Therapy

## 2016-08-07 ENCOUNTER — Encounter (HOSPITAL_COMMUNITY): Payer: Self-pay

## 2016-08-07 ENCOUNTER — Telehealth (HOSPITAL_COMMUNITY): Payer: Self-pay

## 2016-08-07 ENCOUNTER — Ambulatory Visit (HOSPITAL_COMMUNITY): Payer: Medicare Other

## 2016-08-07 NOTE — Telephone Encounter (Signed)
Called patient regarding no show. Patient has had 5 no shows and only 3 completed therapy visits. Patient was informed that due to our attendance policy she will be discharged. If patient feels as if she needs to continue therapy she will need to let Dr. Romeo AppleHarrison know at her follow up visit on 10/16 and he can send a new therapy referral. Pt verbalized understanding.   [name], OTR/L,CBIS  [phone]

## 2016-08-07 NOTE — Therapy (Signed)
[place], [place], [address] Phone: [phone]   Fax:  [phone]  Patient Details  Name: [name] MRN: [id number] Date of Birth: 20-Aug-1966 Referring Provider:  No ref. provider found  Encounter Date: 08/07/2016  OCCUPATIONAL THERAPY DISCHARGE SUMMARY  Visits from Start of Care:3  Current functional level related to goals / functional outcomes:  OT SHORT TERM GOAL #1   Title Pt will be provided with and educated on HEP.    Time 6   Period Weeks   Status On-going       OT SHORT TERM GOAL #2   Title Pt will return to prior level of independence and functioning using RUE as dominant to improve ability to complete caregiving tasks.    Time 6   Period Weeks   Status On-going       OT SHORT TERM GOAL #3   Title Pt will decrease fascial restrictions in RUE from mod to min amounts or less to improve mobility in RUE.    Time 6   Period Weeks   Status On-going       OT SHORT TERM GOAL #4   Title Pt will decrease pain in RUE to 3/10 or less to improve ability to complete daily tasks using RUE as dominant.    Time 6   Period Weeks   Status On-going       OT SHORT TERM GOAL #5   Title Pt will increase A/ROM to [facility] to improve ability to reaching up and into cabinets.    Time 6   Period Weeks   Status On-going       OT SHORT TERM GOAL #6   Title Pt will increase RUE strength to 4/5 to improve ability to perform hair braiding tasks.    Time 6   Period Weeks   Status On-going        Remaining deficits: All deficits remain. Patient has only completed 3 OT sessions and has had 5 no shows.   Education / Equipment: AA/ROM shoulder exercises. Plan: Patient agrees to discharge.  Patient goals were not met. Patient is being discharged due to                                                     ?????5 no shows.        Ailene Ravel, OTR/L,CBIS   [phone]  08/07/2016, 1:25 PM  [place] [address] [place], [place], [address] Phone: [phone]   Fax:  [phone]

## 2016-08-09 ENCOUNTER — Encounter (HOSPITAL_COMMUNITY): Payer: Self-pay

## 2016-08-14 ENCOUNTER — Encounter (HOSPITAL_COMMUNITY): Payer: Self-pay | Admitting: Occupational Therapy

## 2016-08-16 ENCOUNTER — Encounter (HOSPITAL_COMMUNITY): Payer: Self-pay | Admitting: Occupational Therapy

## 2016-08-17 ENCOUNTER — Other Ambulatory Visit (HOSPITAL_COMMUNITY): Payer: Self-pay | Admitting: Psychiatry

## 2016-08-20 ENCOUNTER — Other Ambulatory Visit (HOSPITAL_COMMUNITY): Payer: Self-pay | Admitting: Psychiatry

## 2016-08-21 ENCOUNTER — Ambulatory Visit (HOSPITAL_COMMUNITY): Payer: Medicare Other | Admitting: Psychology

## 2016-08-22 NOTE — Telephone Encounter (Signed)
Called pt pharmacy and spoke with Caryn BeeKevin. Asked Caryn BeeKevin for the date of last written Xanax and he stated 05-15-16 90 tabs 2 refills. Per Caryn BeeKevin they do not have any script on file dated for 07-12-16 for pt. Informed Caryn BeeKevin office will call pt to see if she could bring that script to them and Caryn BeeKevin verbalized understanding. Called pt at 10:54am and lmtcb and office number provided.

## 2016-09-03 ENCOUNTER — Ambulatory Visit: Payer: Medicare Other | Admitting: Orthopedic Surgery

## 2016-09-12 ENCOUNTER — Ambulatory Visit (HOSPITAL_COMMUNITY): Payer: Self-pay | Admitting: Psychology

## 2016-09-20 ENCOUNTER — Other Ambulatory Visit (HOSPITAL_COMMUNITY): Payer: Self-pay | Admitting: Psychiatry

## 2016-09-24 ENCOUNTER — Telehealth (HOSPITAL_COMMUNITY): Payer: Self-pay | Admitting: *Deleted

## 2016-09-24 NOTE — Telephone Encounter (Signed)
Pt pharmacy requesting refills for pt Ambien 10 mg. Per pt chart, pt should not be out of medications yet. [facility] Per Herbert SetaHeather, pt gets her medications pre-packaged and the last printed script they have was written back in July and do not have Aug script that was written. Informed Herbert SetaHeather they would need to call pt for her to stop by the pharmacy to turn in the 07-12-16 script that was written for her and Herbert SetaHeather verbalized understanding.

## 2016-09-27 ENCOUNTER — Other Ambulatory Visit (HOSPITAL_COMMUNITY): Payer: Self-pay | Admitting: Psychiatry

## 2016-10-10 ENCOUNTER — Ambulatory Visit (HOSPITAL_COMMUNITY): Payer: Self-pay | Admitting: Psychiatry

## 2016-10-25 ENCOUNTER — Other Ambulatory Visit (HOSPITAL_COMMUNITY): Payer: Self-pay | Admitting: Psychiatry

## 2016-10-26 ENCOUNTER — Other Ambulatory Visit (HOSPITAL_BASED_OUTPATIENT_CLINIC_OR_DEPARTMENT_OTHER): Payer: Self-pay

## 2016-10-26 DIAGNOSIS — R454 Irritability and anger: Secondary | ICD-10-CM

## 2016-10-26 DIAGNOSIS — G4710 Hypersomnia, unspecified: Secondary | ICD-10-CM

## 2016-10-26 DIAGNOSIS — R5383 Other fatigue: Secondary | ICD-10-CM

## 2016-10-26 DIAGNOSIS — R0683 Snoring: Secondary | ICD-10-CM

## 2016-10-26 DIAGNOSIS — G4730 Sleep apnea, unspecified: Secondary | ICD-10-CM

## 2016-11-16 ENCOUNTER — Ambulatory Visit: Payer: Self-pay

## 2016-12-19 ENCOUNTER — Other Ambulatory Visit (HOSPITAL_COMMUNITY): Payer: Self-pay | Admitting: Psychiatry

## 2016-12-25 ENCOUNTER — Encounter (HOSPITAL_COMMUNITY): Payer: Self-pay

## 2016-12-25 ENCOUNTER — Ambulatory Visit (HOSPITAL_COMMUNITY): Payer: Medicare Other | Admitting: Psychiatry

## 2017-01-10 ENCOUNTER — Ambulatory Visit (INDEPENDENT_AMBULATORY_CARE_PROVIDER_SITE_OTHER): Payer: Self-pay | Admitting: Otolaryngology

## 2017-01-21 ENCOUNTER — Other Ambulatory Visit (HOSPITAL_COMMUNITY): Payer: Self-pay | Admitting: Psychiatry

## 2017-01-21 NOTE — Telephone Encounter (Signed)
Medication refill requests for Bupropion, last filled 10/25/16 by pharmacy and Duloxetine last filled 12/19/16 faxes received . Pt. no showed for eval 12/25/16 and last evaluated 07/12/16.

## 2017-01-25 ENCOUNTER — Telehealth (HOSPITAL_COMMUNITY): Payer: Self-pay | Admitting: *Deleted

## 2017-01-25 NOTE — Telephone Encounter (Signed)
Pt pharmacy requesting refill for pt Cymbalta and Wellbutrin XL. Pt do not have not any appt on file. Per pt chart, she have several no shows on file. On 07-05-16, pt signed appt responsibilities stating she will let office know 24 hours prior to appt. Per pt chart, pt no showed with Dr. Tenny Crawoss on 12-25-2016, 10-10-2016, 04-13-2016-12-27-20168-17-2016, 05-31-2015, 01-24-2015, 07-19-2014, 02-25-2014, 11-17-2013, 06-29-2013. Also, per pt chart she no showed for Dr. Kieth Brightlyodenbough on 09-12-2016, 05-31-2016, 02-20-2016, 02-15-2016, 12-14-2015, 09-30-2015, 08-29-2015, 07-21-2015, 07-07-2015, 05-10-2015, 02-24-2015, 02-18-2014, 07-06-2013, and 08-20-2012. Due to pt pharmacy requesting refills for pt medications, please advise staff on what to do about no shows and medication refill. Pharmacy is  [facility] [place].

## 2017-01-28 NOTE — Telephone Encounter (Signed)
Refill 30 day supply and send discharge letter

## 2017-01-29 ENCOUNTER — Encounter (HOSPITAL_COMMUNITY): Payer: Self-pay | Admitting: *Deleted

## 2017-01-29 ENCOUNTER — Telehealth (HOSPITAL_COMMUNITY): Payer: Self-pay | Admitting: *Deleted

## 2017-01-29 NOTE — Telephone Encounter (Signed)
Per Dr. Tenny Crawoss to only call in 30 days worth of medication for pt due to pt being d/c from practice.

## 2017-01-31 ENCOUNTER — Ambulatory Visit (INDEPENDENT_AMBULATORY_CARE_PROVIDER_SITE_OTHER): Payer: Self-pay | Admitting: Otolaryngology

## 2017-02-04 MED ORDER — DULOXETINE HCL 60 MG PO CPEP
60.0000 mg | ORAL_CAPSULE | Freq: Every day | ORAL | 0 refills | Status: AC
Start: 2017-02-04 — End: 2022-05-18

## 2017-02-04 MED ORDER — BUPROPION HCL ER (XL) 150 MG PO TB24: 300.0000 mg | ORAL_TABLET | ORAL | 0 refills | Status: DC

## 2017-02-04 NOTE — Telephone Encounter (Signed)
Called in refills for pt Cymbalta and Wellbutrin XL and spoke with Harrold DonathNathan.

## 2017-02-04 NOTE — Telephone Encounter (Signed)
Per Dr. Tenny Crawoss to call in only one month of pt Cymbalta and Wellbutrin XL to pt pharmacy due to pt being d/c from practice. Called pt pharmacy and spoke with Harrold DonathNathan the pharmacist and her verbalized understanding with medications.

## 2017-02-12 NOTE — Telephone Encounter (Signed)
Letter was printed and put in provider's box.

## 2017-02-20 ENCOUNTER — Other Ambulatory Visit (HOSPITAL_BASED_OUTPATIENT_CLINIC_OR_DEPARTMENT_OTHER): Payer: Self-pay

## 2017-02-20 DIAGNOSIS — R454 Irritability and anger: Secondary | ICD-10-CM

## 2017-02-20 DIAGNOSIS — G4710 Hypersomnia, unspecified: Secondary | ICD-10-CM

## 2017-02-20 DIAGNOSIS — R0683 Snoring: Secondary | ICD-10-CM

## 2017-02-20 DIAGNOSIS — G4730 Sleep apnea, unspecified: Secondary | ICD-10-CM

## 2017-02-20 DIAGNOSIS — G4733 Obstructive sleep apnea (adult) (pediatric): Secondary | ICD-10-CM

## 2017-02-20 DIAGNOSIS — R5383 Other fatigue: Secondary | ICD-10-CM

## 2017-02-22 ENCOUNTER — Encounter (INDEPENDENT_AMBULATORY_CARE_PROVIDER_SITE_OTHER): Payer: Self-pay

## 2017-02-22 ENCOUNTER — Ambulatory Visit: Payer: Medicare Other | Attending: Nurse Practitioner | Admitting: Neurology

## 2017-02-22 DIAGNOSIS — Z79899 Other long term (current) drug therapy: Secondary | ICD-10-CM | POA: Diagnosis not present

## 2017-02-22 DIAGNOSIS — G4733 Obstructive sleep apnea (adult) (pediatric): Secondary | ICD-10-CM | POA: Diagnosis present

## 2017-02-22 DIAGNOSIS — Z7982 Long term (current) use of aspirin: Secondary | ICD-10-CM | POA: Diagnosis not present

## 2017-02-22 DIAGNOSIS — Z7984 Long term (current) use of oral hypoglycemic drugs: Secondary | ICD-10-CM | POA: Diagnosis not present

## 2017-03-02 NOTE — Procedures (Signed)
[facility] Rourke Mcquitty A. Gerilyn Pilgrim, MD     [email or web address]             NOCTURNAL POLYSOMNOGRAPHY   LOCATION: [facility]   Patient Name: [name], [name] Date: 02/22/2017 Gender: Female D.O.B: Nov 13, 1966 Age (years): 50 Referring Provider: Lavenia Atlas Height (inches): 71 Interpreting Physician: Beryle Beams MD, ABSM Weight (lbs): 200 RPSGT: Peak, Robert BMI: 28 MRN: [id number] Neck Size: 15.00 CLINICAL INFORMATION Sleep Study Type: NPSG  Indication for sleep study: Fatigue, OSA, Snoring  Epworth Sleepiness Score:  SLEEP STUDY TECHNIQUE As per the AASM Manual for the Scoring of Sleep and Associated Events v2.3 (April 2016) with a hypopnea requiring 4% desaturations.  The channels recorded and monitored were frontal, central and occipital EEG, electrooculogram (EOG), submentalis EMG (chin), nasal and oral airflow, thoracic and abdominal wall motion, anterior tibialis EMG, snore microphone, electrocardiogram, and pulse oximetry.  MEDICATIONS Medications self-administered by patient taken the night of the study : N/A  Current Outpatient Prescriptions:  .  ALPRAZolam (XANAX) 0.5 MG tablet, Take 1 tablet (0.5 mg total) by mouth 3 (three) times daily as needed for sleep or anxiety., Disp: 90 tablet, Rfl: 2 .  aspirin 81 MG tablet, Take 81 mg by mouth every morning. , Disp: , Rfl:  .  atorvastatin (LIPITOR) 10 MG tablet, Take 1 tablet by mouth daily., Disp: , Rfl:  .  buPROPion (WELLBUTRIN XL) 150 MG 24 hr tablet, Take 2 tablets (300 mg total) by mouth every morning., Disp: 60 tablet, Rfl: 0 .  Cholecalciferol (VITAMIN D PO), Take 5,000 Units by mouth daily., Disp: , Rfl:  .  COMBIVENT RESPIMAT 20-100 MCG/ACT AERS respimat, , Disp: , Rfl:  .  Cyanocobalamin (VITAMIN B-12 PO), Take by mouth daily., Disp: , Rfl:  .  DEXILANT 60 MG capsule, TAKE 1 CAPSULE BY MOUTH ONCE A DAY., Disp: 30 capsule, Rfl: 11 .  DULoxetine (CYMBALTA) 60 MG capsule, Take 1 capsule (60 mg  total) by mouth daily., Disp: 30 capsule, Rfl: 0 .  fexofenadine (ALLEGRA) 180 MG tablet, Take 180 mg by mouth every morning. , Disp: , Rfl:  .  fluconazole (DIFLUCAN) 100 MG tablet, Take 1 tablet (100 mg total) by mouth daily., Disp: 10 tablet, Rfl: 0 .  furosemide (LASIX) 20 MG tablet, Take 20 mg by mouth 2 (two) times daily.  , Disp: , Rfl:  .  ibuprofen (ADVIL,MOTRIN) 800 MG tablet, Take 800 mg by mouth as needed for headache. Reported on 03/16/2016, Disp: , Rfl:  .  lidocaine (LIDODERM) 5 %, Place 1 patch onto the skin every 12 (twelve) hours as needed (for pain). Reported on 03/16/2016, Disp: , Rfl:  .  Liraglutide (VICTOZA) 18 MG/3ML SOPN, Inject 1.8 mg into the skin every evening. , Disp: , Rfl:  .  LYRICA 75 MG capsule, Take 75 mg by mouth 2 (two) times daily. , Disp: , Rfl:  .  meloxicam (MOBIC) 15 MG tablet, 15 mg., Disp: , Rfl:  .  metFORMIN (GLUCOPHAGE-XR) 500 MG 24 hr tablet, Take by mouth. Taking 4 Tablets Daily, Disp: , Rfl:  .  metoprolol (LOPRESSOR) 100 MG tablet, Take 100 mg by mouth 2 (two) times daily. , Disp: , Rfl:  .  Multiple Vitamin (MULTIVITAMIN) capsule, Take 1 capsule by mouth daily.  , Disp: , Rfl:  .  nystatin-triamcinolone ointment (MYCOLOG), Apply 1 application topically 2 (two) times daily. (Patient not taking: Reported on 07/12/2016), Disp: 30 g, Rfl: 1 .  oxycodone (ROXICODONE) 30 MG immediate release tablet,  Take 30 mg by mouth every 6 (six) hours as needed. For pain, Disp: , Rfl:  .  pantoprazole (PROTONIX) 40 MG tablet, TAKE (1) TABLET BY MOUTH ONCE DAILY., Disp: 30 tablet, Rfl: 2 .  Probiotic Product (PHILLIPS COLON HEALTH PO), Take 1 capsule by mouth daily., Disp: , Rfl:  .  valsartan-hydrochlorothiazide (DIOVAN-HCT) 160-25 MG per tablet, Take 1 tablet by mouth daily., Disp: , Rfl:  .  VOLTAREN 1 % GEL, Apply 2 g topically daily as needed (pain). , Disp: , Rfl:  .  zolpidem (AMBIEN CR) 12.5 MG CR tablet, Take 1 tablet (12.5 mg total) by mouth at bedtime as  needed for sleep., Disp: 30 tablet, Rfl: 2   SLEEP ARCHITECTURE The study was initiated at 10:45:33 PM and ended at 5:01:24 AM.  Sleep onset time was 15.5 minutes and the sleep efficiency was 66.2%. The total sleep time was 249.0 minutes.  Stage REM latency was 176.5 minutes.  The patient spent 12.85% of the night in stage N1 sleep, 74.30% in stage N2 sleep, 0.00% in stage N3 and 12.85% in REM.  Alpha intrusion was absent.  Supine sleep was 0.00%.  RESPIRATORY PARAMETERS The overall apnea/hypopnea index (AHI) was 4.1 per hour. There were 3 total apneas, including 3 obstructive, 0 central and 0 mixed apneas. There were 14 hypopneas and 8 RERAs.  The AHI during Stage REM sleep was 18.8 per hour.  AHI while supine was N/A per hour.  The mean oxygen saturation was 93.23%. The minimum SpO2 during sleep was 85.00%.  Soft snoring was noted during this study.  CARDIAC DATA The 2 lead EKG demonstrated sinus rhythm. The mean heart rate was 83.21 beats per minute. Other EKG findings include: PVCs. LEG MOVEMENT DATA The total PLMS were 0 with a resulting PLMS index of 0.00. Associated arousal with leg movement index was 0.0.  IMPRESSIONS - No significant obstructive sleep apnea occurred during this study. - Absent slow wave sleep is noted.   Argie Ramming, MD Diplomate, American Board of Sleep Medicine.    ELECTRONICALLY SIGNED ON:  03/02/2017, 9:11 AM [facility] PH: [phone]   FX: [phone] ACCREDITED BY THE AMERICAN ACADEMY OF SLEEP MEDICINE

## 2017-03-04 ENCOUNTER — Ambulatory Visit (INDEPENDENT_AMBULATORY_CARE_PROVIDER_SITE_OTHER): Payer: Medicare Other | Admitting: Otolaryngology

## 2017-03-04 DIAGNOSIS — R42 Dizziness and giddiness: Secondary | ICD-10-CM

## 2017-03-04 DIAGNOSIS — H903 Sensorineural hearing loss, bilateral: Secondary | ICD-10-CM | POA: Diagnosis not present

## 2017-03-04 DIAGNOSIS — R51 Headache: Secondary | ICD-10-CM

## 2017-03-04 DIAGNOSIS — H6983 Other specified disorders of Eustachian tube, bilateral: Secondary | ICD-10-CM

## 2017-03-21 ENCOUNTER — Other Ambulatory Visit: Payer: Self-pay | Admitting: Nurse Practitioner

## 2017-03-21 ENCOUNTER — Other Ambulatory Visit (HOSPITAL_COMMUNITY): Payer: Self-pay | Admitting: Psychiatry

## 2017-03-25 ENCOUNTER — Other Ambulatory Visit (HOSPITAL_COMMUNITY): Payer: Self-pay | Admitting: Psychiatry

## 2017-03-28 ENCOUNTER — Telehealth: Payer: Self-pay | Admitting: Diagnostic Neuroimaging

## 2017-03-28 NOTE — Telephone Encounter (Signed)
Pt called said she's had a severe neck pain for the past 3 mths. She thought it would get better but it has not. [facility] Pt was advised if there were any questions RN would call.

## 2017-04-02 ENCOUNTER — Encounter: Payer: Self-pay | Admitting: Nurse Practitioner

## 2017-04-02 ENCOUNTER — Encounter (INDEPENDENT_AMBULATORY_CARE_PROVIDER_SITE_OTHER): Payer: Self-pay

## 2017-04-02 ENCOUNTER — Ambulatory Visit (INDEPENDENT_AMBULATORY_CARE_PROVIDER_SITE_OTHER): Payer: Medicare Other | Admitting: Nurse Practitioner

## 2017-04-02 VITALS — BP 138/82 | HR 84 | Ht 71.0 in | Wt 198.5 lb

## 2017-04-02 DIAGNOSIS — M25522 Pain in left elbow: Secondary | ICD-10-CM | POA: Diagnosis not present

## 2017-04-02 DIAGNOSIS — M25512 Pain in left shoulder: Secondary | ICD-10-CM

## 2017-04-02 DIAGNOSIS — G43009 Migraine without aura, not intractable, without status migrainosus: Secondary | ICD-10-CM | POA: Diagnosis not present

## 2017-04-02 NOTE — Patient Instructions (Signed)
Will refer [place] ortho Dr. [place]arrison [place] shoulder pain and limited ROM Continue pain management clinic treatment May also need PT  Follow up prn

## 2017-04-02 NOTE — Progress Notes (Signed)
[facility]  PATIENT: [name] DOB: [birth year]   REASON FOR VISIT: Follow-up for her cervicogenic headache, new shoulder pain HISTORY FROM: Patient    HISTORY OF PRESENT ILLNESS:UPDATE 05/15/2018CM [name], 51 year old female returns for follow-up with increased left shoulder pain for about 3 months. She also has history of cervicogenic headache. She does not think that she has injured her shoulder in any way however she definitely has decreased range of motion. She also has history of fibromyalgia. For her headaches in the past she has failed Topamax Maxalt Botox. She is currently going to a pain management for her chronic pain and is on Cymbalta Lidoderm patch , Mobic, oxycodone Lyrica and Fioricet. Patient claims she has a previous history of right shoulder problems. She has previously seen an orthopedist in the past Dr. Romeo [name]. She returns for reevaluation. UPDATE 07/03/16: Since last visit, patient has been going to [facility] for neck and low back pain. Lost to follow up here. Previous botox injections for torticollis only had temporary relief. Also, her daughter passed away in 06-01-2017and now patient has to take care of her daughter's 2 surving saughter (age 64 and 38). Stress levels are very high.  UPDATE 09/17/13: And patient returns for further evaluation of headaches and increasing neck pain. Patient has headache associated with nausea, photophobia and blurred vision. She also has left-sided neck pain. She has felt her head turning towards the right sided tilting towards the right side. She has even resorted to using a neck tie, height around her head, attached to the wall and using this to stretch her neck towards the left side. Now patient tells me that she was diagnosed with torticollis several years ago and treated with Botox injections by a neurologist in [place], [place]. Patient had MRI of the neck last month which showed minimal disc bulging  but no surgically treatable lesion.  UPDATE 03/03/12: Doing about the same. Lots of stress, still smoking. Tried TPX, but not helping.   PRIOR HPI: 51 year old female with history of depression, anxiety, fibromyalgia, hypertension, diabetes, here for evaluation of headaches, pain and eye twitching. For the past 3 months, patient has had right temporal headaches, intermittent right occipital tenderness to palpation with radiation to the right eye, intermittent facial numbness, intermittent slurred speech, intermittent blurred vision and right eye twitching. No nausea or vomiting. She has some photophobia. She has tried Maxalt without relief of symptoms. She has daily symptoms without relief. She does not take any medications specifically for these headache symptoms. She is on Lyrica for fibromyalgia and Percocet for back pain.    REVIEW OF SYSTEMS: Full 14 system review of systems performed and notable only for those listed, all others are neg:  Constitutional: neg  Cardiovascular: neg Ear/Nose/Throat: neg  Skin: neg Eyes: neg Respiratory: neg Gastroitestinal: neg  Hematology/Lymphatic: neg  Endocrine: neg Musculoskeletal: Neck and left shoulder pain Allergy/Immunology: neg Neurological: History of migraines Psychiatric: neg Sleep : neg   ALLERGIES: Allergies  Allergen Reactions  . Neurontin [Gabapentin] Other (See Comments)    Out of it and could not function in this while on all the other bp meds.    HOME MEDICATIONS: Outpatient Medications Prior to Visit  Medication Sig Dispense Refill  . ALPRAZolam (XANAX) 0.5 MG tablet Take 1 tablet (0.5 mg total) by mouth 3 (three) times daily as needed for sleep or anxiety. 90 tablet 2  . aspirin 81 MG tablet Take 81 mg by mouth every morning.     Marland Kitchen  atorvastatin (LIPITOR) 10 MG tablet Take 1 tablet by mouth daily.    Marland Kitchen buPROPion (WELLBUTRIN XL) 150 MG 24 hr tablet Take 2 tablets (300 mg total) by mouth every morning. 60 tablet 0    . Cholecalciferol (VITAMIN D PO) Take 5,000 Units by mouth daily.    . COMBIVENT RESPIMAT 20-100 MCG/ACT AERS respimat     . Cyanocobalamin (VITAMIN B-12 PO) Take by mouth daily.    Marland Kitchen DEXILANT 60 MG capsule TAKE 1 CAPSULE BY MOUTH ONCE A DAY. 30 capsule 3  . DULoxetine (CYMBALTA) 60 MG capsule Take 1 capsule (60 mg total) by mouth daily. 30 capsule 0  . fexofenadine (ALLEGRA) 180 MG tablet Take 180 mg by mouth every morning.     . fluconazole (DIFLUCAN) 100 MG tablet Take 1 tablet (100 mg total) by mouth daily. 10 tablet 0  . furosemide (LASIX) 20 MG tablet Take 20 mg by mouth 2 (two) times daily.      Marland Kitchen lidocaine (LIDODERM) 5 % Place 1 patch onto the skin every 12 (twelve) hours as needed (for pain). Reported on 03/16/2016    . Liraglutide (VICTOZA) 18 MG/3ML SOPN Inject 1.8 mg into the skin every evening.     Marland Kitchen LYRICA 75 MG capsule Take 75 mg by mouth 2 (two) times daily.     . meloxicam (MOBIC) 15 MG tablet 15 mg.    . metFORMIN (GLUCOPHAGE-XR) 500 MG 24 hr tablet Take by mouth. Taking 4 Tablets Daily    . metoprolol (LOPRESSOR) 100 MG tablet Take 100 mg by mouth 2 (two) times daily.     . Multiple Vitamin (MULTIVITAMIN) capsule Take 1 capsule by mouth daily.      Marland Kitchen nystatin-triamcinolone ointment (MYCOLOG) Apply 1 application topically 2 (two) times daily. 30 g 1  . oxycodone (ROXICODONE) 30 MG immediate release tablet Take 30 mg by mouth every 6 (six) hours as needed. For pain    . pantoprazole (PROTONIX) 40 MG tablet TAKE (1) TABLET BY MOUTH ONCE DAILY. 30 tablet 2  . Probiotic Product (PHILLIPS COLON HEALTH PO) Take 1 capsule by mouth daily.    . valsartan-hydrochlorothiazide (DIOVAN-HCT) 160-25 MG per tablet Take 1 tablet by mouth daily.    . VOLTAREN 1 % GEL Apply 2 g topically daily as needed (pain).     Marland Kitchen zolpidem (AMBIEN CR) 12.5 MG CR tablet Take 1 tablet (12.5 mg total) by mouth at bedtime as needed for sleep. 30 tablet 2  . ibuprofen (ADVIL,MOTRIN) 800 MG tablet Take 800 mg by  mouth as needed for headache. Reported on 03/16/2016     No facility-administered medications prior to visit.     PAST MEDICAL HISTORY: Past Medical History:  Diagnosis Date  . BMI (body mass index) 20.0-29.9 2008 192 lbs  . Complication of anesthesia    post op N/V  . Depression   . Diabetes mellitus   . GERD (gastroesophageal reflux disease) 2002   202 lbs  . Hypertension   . Irritable bowel syndrome 2008   diarrhea predominant MEDS TRIED: BENTYL, LEVBID  . LBP (low back pain)   . PONV (postoperative nausea and vomiting)   . PTSD (post-traumatic stress disorder)   . RUQ abdominal pain 2004 HIDA NL CTA w/ IVC-FATTY LIVER, ANGIOMYOLIPOMA LEFT KIDNEY   2008 HIDA NL    PAST SURGICAL HISTORY: Past Surgical History:  Procedure Laterality Date  . ANKLE SURGERY  RIGHT  . BREAST REDUCTION SURGERY  2006  . COLONOSCOPY  2008 RMR  PAN-COLONIC TICS, NL TI  . COLONOSCOPY  2013   Dr. Teena DunkBenson: diverticulum in ascending colon, mild diverticulosis in sigmoid. Recommended 5 year surveillance  . ESOPHAGEAL DILATION N/A 04/06/2015   Procedure: ESOPHAGEAL DILATION;  Surgeon: Corbin Adeobert M Rourk, MD;  Location: [facility];  Service: Gastroenterology;  Laterality: N/A;  300  . ESOPHAGOGASTRODUODENOSCOPY N/A 04/06/2015   RMR: Normal EGD status post Maloney dilation of teh esophagus as described above  . LAPAROSCOPIC BILATERAL SALPINGO OOPHERECTOMY Bilateral 11/22/2015   Procedure: LAPAROSCOPIC BILATERAL SALPINGO OOPHORECTOMY;  Surgeon: Tilda BurrowJohn Ferguson V, MD;  Location: [facility];  Service: Gynecology;  Laterality: Bilateral;  . LUMBAR DISC SURGERY  2005  . SHOULDER SURGERY  1997 RIGHT  . TOTAL ABDOMINAL HYSTERECTOMY  2000 fibroids/DUB  . UPPER GASTROINTESTINAL ENDOSCOPY  2008 RMR   SML HH o/w NL  . vocal cord polyp removal  2013   Dr. Josephina ShihEwain Wilson    FAMILY HISTORY: Family History  Problem Relation Age of Onset  . Breast cancer Maternal Aunt   . Anxiety disorder Maternal Aunt   . Anxiety disorder  Mother   . Stroke Mother   . COPD Mother   . Hypertension Mother   . Thyroid disease Mother   . Diabetes Mother   . Drug abuse Daughter   . Anxiety disorder Daughter   . Seizures Sister   . Alcohol abuse Maternal Uncle   . Mental illness Cousin   . Dementia Maternal Aunt   . Anxiety disorder Sister   . Stroke Sister   . Anxiety disorder Sister   . Drug abuse Sister   . Early death Sister   . Heart disease Sister   . Heart attack Sister   . Stroke Maternal Grandmother   . Colon polyps Neg Hx   . Colon cancer Neg Hx   . ADD / ADHD Neg Hx   . Bipolar disorder Neg Hx   . Depression Neg Hx   . OCD Neg Hx   . Paranoid behavior Neg Hx   . Schizophrenia Neg Hx   . Sexual abuse Neg Hx   . Physical abuse Neg Hx     SOCIAL HISTORY: Social History   Social History  . Marital status: Single    Spouse name: N/A  . Number of children: 3  . Years of education: 12th   Occupational History  .  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 Smoking status: Former Smoker    Packs/day: 0.50    Years: 2.00    Types: Cigarettes    Quit date: 05/11/2015  . Smokeless tobacco: Never Used  . Alcohol use No  . Drug use: No  . Sexual activity: Not on file   Other Topics Concern  . Not on file   Social History Narrative   Patient lives at home with her family. Raising two grand daughters, daughter passed away 03/20/16   Caffeine Use: 2 cups of coffee daily     PHYSICAL EXAM  Vitals:   04/02/17 1056  BP: 138/82  Pulse: 84  Weight: 198 lb 8 oz (90 kg)  Height: 5\' 11"  (1.803 m)   Body mass index is 27.69 kg/m.  Generalized: Well developed, in no acute distress  Head: normocephalic and atraumatic,. Oropharynx benign  Neck: Supple,  Cardiac: Regular rate rhythm, no murmur  Musculoskeletal: No deformity   Neurological examination   Mentation: Alert oriented to time, place, history taking. Attention span and concentration appropriate. Recent and  remote memory  intact.  Follows all commands speech and language fluent.   Cranial nerve II-XII: Pupils were equal round reactive to light extraocular movements were full, visual field were full on confrontational test. Facial sensation and strength were normal. hearing was intact to finger rubbing bilaterally. Uvula tongue midline. head turning and shoulder shrug were normal and symmetric.Tongue protrusion into cheek strength was normal. Motor: normal bulk and tone, full strength in the BUE, BLE, except left upper extremity decreased range of motion at the shoulder with pain  Sensory: normal and symmetric to light touch, pinprick, and  Vibration in the upper and lower extremities,  Coordination: finger-nose-finger normal on the right difficulty performing on the left, heel-to-shin normal bilaterally,  Reflexes: Symmetric upper and lower plantar responses were flexor bilaterally. Gait and Station: Rising up from seated position without assistance, normal stance,  moderate stride, good arm swing, smooth turning, able to perform tiptoe, and heel walking without difficulty. Tandem gait is steady  DIAGNOSTIC DATA (LABS, IMAGING, TESTING) - I reviewed patient records, labs, notes, testing and imaging myself where available.  Lab Results  Component Value Date   WBC 10.6 (H) 11/23/2015   HGB 11.3 (L) 11/23/2015   HCT 34.2 (L) 11/23/2015   MCV 93.2 11/23/2015   PLT 284 11/23/2015      Component Value Date/Time   NA 138 11/23/2015 0605   K 3.8 11/23/2015 0605   CL 101 11/23/2015 0605   CO2 31 11/23/2015 0605   GLUCOSE 275 (H) 11/23/2015 0605   BUN 13 11/23/2015 0605   CREATININE 1.00 11/23/2015 0605   CALCIUM 8.4 (L) 11/23/2015 0605   GFRNONAA >60 11/23/2015 0605   GFRAA >60 11/23/2015 4098    ASSESSMENT AND PLAN  51 y.o. year old female  has a past medical history of Neck pain low back pain and fibromyalgia cervicogenic headaches and migraine headaches. Patient has tried and failed topiramate Maxalt and  Botox. She has a new complaint of left shoulder pain with decreased range of motion  PLAN: Will refer to ortho Dr. Romeo [name] in [place] for left shoulder pain and limited ROM Continue pain management clinic treatment May also need PT  Follow up prn I spent 25 minutes total face to face time with the patient more than 50% of which was spent counseling and coordination of care, reviewing test results reviewing medications and discussing and reviewing the diagnosis of cervicogenic headache and left shoulder pain and further treatment options. See above ,  Nilda Riggs, [facility], [facility], APRN  [facility] [address] [place], [place] [address] [phone]

## 2017-04-02 NOTE — Progress Notes (Signed)
I reviewed note and agree with plan.   Suanne MarkerVIKRAM R. PENUMALLI, MD 04/02/2017, 2:30 PM Certified in Neurology, Neurophysiology and Neuroimaging  [facility] [address], [address] [place], [place] [address] [phone]

## 2017-04-03 ENCOUNTER — Telehealth: Payer: Self-pay | Admitting: Nurse Practitioner

## 2017-04-03 NOTE — Telephone Encounter (Signed)
Noted  

## 2017-04-03 NOTE — Telephone Encounter (Signed)
Patient's insurance requires that her PCP . [facility].  I have called Patient and left her a message and called her PCP as well.

## 2017-04-03 NOTE — Telephone Encounter (Signed)
noted 

## 2017-04-09 ENCOUNTER — Encounter: Payer: Self-pay | Admitting: Orthopaedic Surgery

## 2017-04-09 ENCOUNTER — Ambulatory Visit (INDEPENDENT_AMBULATORY_CARE_PROVIDER_SITE_OTHER): Payer: Medicare Other

## 2017-04-09 ENCOUNTER — Ambulatory Visit (INDEPENDENT_AMBULATORY_CARE_PROVIDER_SITE_OTHER): Payer: Medicare Other | Admitting: Orthopaedic Surgery

## 2017-04-09 VITALS — BP 137/89 | HR 115 | Temp 97.9°F | Ht 71.0 in | Wt 193.0 lb

## 2017-04-09 DIAGNOSIS — M25512 Pain in left shoulder: Secondary | ICD-10-CM

## 2017-04-09 DIAGNOSIS — G8929 Other chronic pain: Secondary | ICD-10-CM | POA: Diagnosis not present

## 2017-04-09 NOTE — Progress Notes (Signed)
Patient ZO:XWRUEA [name], female DOB:1966-08-09, 51 y.o. VWU:[id number]  Chief Complaint  Patient presents with  . Shoulder Pain    left shoulder pain    HPI  [name] is Cynthia 51 y.o. female who has had increasing pain of the left shoulder over the last three months, more over the last several weeks.  She has no trauma, no paresthesias, no redness or swelling.  She has pain raising her hand overhead.  She has no help with ice, rest, heat, rubs or Advil.  She is tried of it hurting. HPI  Body mass index is 26.92 kg/m.  ROS  Review of Systems  Eyes: Positive for visual disturbance.  Cardiovascular: Positive for leg swelling.  Musculoskeletal: Positive for arthralgias and back pain.  Allergic/Immunologic: Positive for environmental allergies.  Neurological: Positive for weakness and numbness.  Psychiatric/Behavioral: The patient is nervous/anxious.   All other systems reviewed and are negative.   Past Medical History:  Diagnosis Date  . BMI (body mass index) 20.0-29.9 2008 192 lbs  . Complication of anesthesia    post op N/V  . Depression   . Diabetes mellitus   . GERD (gastroesophageal reflux disease) 2002   202 lbs  . Hypertension   . Irritable bowel syndrome 2008   diarrhea predominant MEDS TRIED: BENTYL, LEVBID  . LBP (low back pain)   . PONV (postoperative nausea and vomiting)   . PTSD (post-traumatic stress disorder)   . RUQ abdominal pain 2004 HIDA NL CTA w/ IVC-FATTY LIVER, ANGIOMYOLIPOMA LEFT KIDNEY   2008 HIDA NL    Past Surgical History:  Procedure Laterality Date  . ANKLE SURGERY  RIGHT  . BREAST REDUCTION SURGERY  2006  . COLONOSCOPY  2008 RMR   PAN-COLONIC TICS, NL TI  . COLONOSCOPY  2013   Dr. Teena Dunk: diverticulum in ascending colon, mild diverticulosis in sigmoid. Recommended 5 year surveillance  . ESOPHAGEAL DILATION N/Cynthia 04/06/2015   Procedure: ESOPHAGEAL DILATION;  Surgeon: Corbin Ade, MD;  [facility]: [facility];  Service: Gastroenterology;   Laterality: N/Cynthia;  300  . ESOPHAGOGASTRODUODENOSCOPY N/Cynthia 04/06/2015   RMR: Normal EGD status post Maloney dilation of teh esophagus as described above  . LAPAROSCOPIC BILATERAL SALPINGO OOPHERECTOMY Bilateral 11/22/2015   Procedure: LAPAROSCOPIC BILATERAL SALPINGO OOPHORECTOMY;  Surgeon: Tilda Burrow, MD;  Location: [facility];  Service: Gynecology;  Laterality: Bilateral;  . LUMBAR DISC SURGERY  2005  . SHOULDER SURGERY  1997 RIGHT  . TOTAL ABDOMINAL HYSTERECTOMY  2000 fibroids/DUB  . UPPER GASTROINTESTINAL ENDOSCOPY  2008 RMR   SML HH o/w NL  . vocal cord polyp removal  2013   Dr. Josephina Shih    Family History  Problem Relation Age of Onset  . Breast cancer Maternal Aunt   . Anxiety disorder Maternal Aunt   . Anxiety disorder Mother   . Stroke Mother   . COPD Mother   . Hypertension Mother   . Thyroid disease Mother   . Diabetes Mother   . Drug abuse Daughter   . Anxiety disorder Daughter   . Seizures Sister   . Alcohol abuse Maternal Uncle   . Mental illness Cousin   . Dementia Maternal Aunt   . Anxiety disorder Sister   . Stroke Sister   . Anxiety disorder Sister   . Drug abuse Sister   . Early death Sister   . Heart disease Sister   . Heart attack Sister   . Stroke Maternal Grandmother   . Colon polyps Neg  Hx   . Colon cancer Neg Hx   . ADD / ADHD Neg Hx   . Bipolar disorder Neg Hx   . Depression Neg Hx   . OCD Neg Hx   . Paranoid behavior Neg Hx   . Schizophrenia Neg Hx   . Sexual abuse Neg Hx   . Physical abuse Neg Hx     Social History Social History  Substance Use Topics  . Smoking status: Former Smoker    Packs/day: 0.50    Years: 2.00    Types: Cigarettes    Quit date: 05/11/2015  . Smokeless tobacco: Never Used  . Alcohol use No    Allergies  Allergen Reactions  . Neurontin [Gabapentin] Other (See Comments)    Out of it and could not function in this while on all the other bp meds.    Current Outpatient Prescriptions  Medication Sig Dispense  Refill  . ALPRAZolam (XANAX) 0.5 MG tablet Take 1 tablet (0.5 mg total) by mouth 3 (three) times daily as needed for sleep or anxiety. 90 tablet 2  . aspirin 81 MG tablet Take 81 mg by mouth every morning.     Marland Kitchen atorvastatin (LIPITOR) 10 MG tablet Take 1 tablet by mouth daily.    Marland Kitchen buPROPion (WELLBUTRIN XL) 150 MG 24 hr tablet Take 2 tablets (300 mg total) by mouth every morning. 60 tablet 0  . Butalbital-APAP-Caffeine (FIORICET PO) Take by mouth. 1 tablet every 8 hour prn headache    . Cholecalciferol (VITAMIN D PO) Take 5,000 Units by mouth daily.    . COMBIVENT RESPIMAT 20-100 MCG/ACT AERS respimat     . Cyanocobalamin (VITAMIN B-12 PO) Take by mouth daily.    Marland Kitchen DEXILANT 60 MG capsule TAKE 1 CAPSULE BY MOUTH ONCE Cynthia DAY. 30 capsule 3  . DULoxetine (CYMBALTA) 60 MG capsule Take 1 capsule (60 mg total) by mouth daily. 30 capsule 0  . fexofenadine (ALLEGRA) 180 MG tablet Take 180 mg by mouth every morning.     . fluconazole (DIFLUCAN) 100 MG tablet Take 1 tablet (100 mg total) by mouth daily. 10 tablet 0  . furosemide (LASIX) 20 MG tablet Take 20 mg by mouth 2 (two) times daily.      Marland Kitchen lidocaine (LIDODERM) 5 % Place 1 patch onto the skin every 12 (twelve) hours as needed (for pain). Reported on 03/16/2016    . Liraglutide (VICTOZA) 18 MG/3ML SOPN Inject 1.8 mg into the skin every evening.     Marland Kitchen LYRICA 75 MG capsule Take 75 mg by mouth 2 (two) times daily.     . meloxicam (MOBIC) 15 MG tablet 15 mg.    . metFORMIN (GLUCOPHAGE-XR) 500 MG 24 hr tablet Take by mouth. Taking 4 Tablets Daily    . metoprolol (LOPRESSOR) 100 MG tablet Take 100 mg by mouth 2 (two) times daily.     . Multiple Vitamin (MULTIVITAMIN) capsule Take 1 capsule by mouth daily.      Marland Kitchen nystatin-triamcinolone ointment (MYCOLOG) Apply 1 application topically 2 (two) times daily. 30 g 1  . oxycodone (ROXICODONE) 30 MG immediate release tablet Take 30 mg by mouth every 6 (six) hours as needed. For pain    . pantoprazole (PROTONIX) 40  MG tablet TAKE (1) TABLET BY MOUTH ONCE DAILY. 30 tablet 2  . Probiotic Product (PHILLIPS COLON HEALTH PO) Take 1 capsule by mouth daily.    . valsartan-hydrochlorothiazide (DIOVAN-HCT) 160-25 MG per tablet Take 1 tablet by mouth daily.    Marland Kitchen  VOLTAREN 1 % GEL Apply 2 g topically daily as needed (pain).     Marland Kitchen. zolpidem (AMBIEN CR) 12.5 MG CR tablet Take 1 tablet (12.5 mg total) by mouth at bedtime as needed for sleep. 30 tablet 2   No current facility-administered medications for this visit.      Physical Exam  Blood pressure 137/89, pulse (!) 115, temperature 97.9 F (36.6 C), height 5\' 11"  (1.803 m), weight 193 lb (87.5 kg).  Constitutional: overall normal hygiene, normal nutrition, well developed, normal grooming, normal body habitus. Assistive device:none  Musculoskeletal: gait and station Limp none, muscle tone and strength are normal, no tremors or atrophy is present.  .  Neurological: coordination overall normal.  Deep tendon reflex/nerve stretch intact.  Sensation normal.  Cranial nerves II-XII intact.   Skin:   Normal overall no scars, lesions, ulcers or rashes. No psoriasis.  Psychiatric: Alert and oriented x 3.  Recent memory intact, remote memory unclear.  Normal mood and affect. Well groomed.  Good eye contact.  Cardiovascular: overall no swelling, no varicosities, no edema bilaterally, normal temperatures of the legs and arms, no clubbing, cyanosis and good capillary refill.  Lymphatic: palpation is normal.  Examination of left Upper Extremity is done.  Inspection:   Overall:  Elbow non-tender without crepitus or defects, forearm non-tender without crepitus or defects, wrist non-tender without crepitus or defects, hand non-tender.    Shoulder: with glenohumeral joint tenderness, without effusion.   Upper arm: without swelling and tenderness   Range of motion:   Overall:  Full range of motion of the elbow, full range of motion of wrist and full range of motion in  fingers.   Shoulder:  left  145 degrees forward flexion; 125 degrees abduction; 30 degrees internal rotation, 30 degrees external rotation, 15 degrees extension, 35 degrees adduction.   Stability:   Overall:  Shoulder, elbow and wrist stable   Strength and Tone:   Overall full shoulder muscles strength, full upper arm strength and normal upper arm bulk and tone.   The patient has been educated about the nature of the problem(s) and counseled on treatment options.  The patient appeared to understand what I have discussed and is in agreement with it.  Encounter Diagnosis  Name Primary?  . Chronic left shoulder pain Yes   X-rays were done of the left shoulder, reported separately.  PROCEDURE NOTE:  The patient request injection, verbal consent was obtained.  The left shoulder was prepped appropriately after time out was performed.   Sterile technique was observed and injection of 1 cc of Depo-Medrol 40 mg with several cc's of plain xylocaine. Anesthesia was provided by ethyl chloride and Cynthia 20-gauge needle was used to inject the shoulder area. Cynthia posterior approach was used.  The injection was tolerated well.  Cynthia band aid dressing was applied.  The patient was advised to apply ice later today and tomorrow to the injection sight as needed.   PLAN Call if any problems.  Precautions discussed.  Continue current medications.   Return to clinic 2 weeks   Consider MRI if not improved on return appointment.  Electronically Signed Darreld McleanWayne Anyelo Mccue, MD 5/22/20183:54 PM

## 2017-04-23 ENCOUNTER — Ambulatory Visit: Payer: Self-pay | Admitting: Orthopaedic Surgery

## 2017-04-24 ENCOUNTER — Ambulatory Visit (INDEPENDENT_AMBULATORY_CARE_PROVIDER_SITE_OTHER): Payer: Medicare Other | Admitting: Orthopaedic Surgery

## 2017-04-24 VITALS — BP 122/86 | HR 87 | Ht 71.0 in | Wt 191.0 lb

## 2017-04-24 DIAGNOSIS — G8929 Other chronic pain: Secondary | ICD-10-CM

## 2017-04-24 DIAGNOSIS — M25512 Pain in left shoulder: Secondary | ICD-10-CM | POA: Diagnosis not present

## 2017-04-24 NOTE — Progress Notes (Signed)
Patient ZO:XWRUEA [name], female DOB:07/31/1966, 51 y.o. VWU:[id number]  Chief Complaint  Patient presents with  . Follow-up    Left shoulder    HPI  [name] is Cynthia 51 y.o. female who has continued pain in the left shoulder.  The last injection did not help.  She has pain with overhead use and rolling over on it at night.  She has no paresthesias, no redness,no new trauma.  She has been on conservative treatment without success.  I would like to get Cynthia MRI of the left shoulder. HPI  Body mass index is 26.64 kg/m.  ROS  Review of Systems  Eyes: Positive for visual disturbance.  Cardiovascular: Positive for leg swelling.  Musculoskeletal: Positive for arthralgias and back pain.  Allergic/Immunologic: Positive for environmental allergies.  Neurological: Positive for weakness and numbness.  Psychiatric/Behavioral: The patient is nervous/anxious.   All other systems reviewed and are negative.   Past Medical History:  Diagnosis Date  . BMI (body mass index) 20.0-29.9 2008 192 lbs  . Complication of anesthesia    post op N/V  . Depression   . Diabetes mellitus   . GERD (gastroesophageal reflux disease) 2002   202 lbs  . Hypertension   . Irritable bowel syndrome 2008   diarrhea predominant MEDS TRIED: BENTYL, LEVBID  . LBP (low back pain)   . PONV (postoperative nausea and vomiting)   . PTSD (post-traumatic stress disorder)   . RUQ abdominal pain 2004 [facility] NL CTA w/ IVC-FATTY LIVER, ANGIOMYOLIPOMA LEFT KIDNEY   2008 HIDA NL    Past Surgical History:  Procedure Laterality Date  . ANKLE SURGERY  RIGHT  . BREAST REDUCTION SURGERY  2006  . COLONOSCOPY  2008 RMR   PAN-COLONIC TICS, NL TI  . COLONOSCOPY  2013   Dr. Teena Dunk: diverticulum in ascending colon, mild diverticulosis in sigmoid. Recommended 5 year surveillance  . ESOPHAGEAL DILATION N/Cynthia 04/06/2015   Procedure: ESOPHAGEAL DILATION;  Surgeon: Corbin Ade, MD;  Location: [facility];  Service: Gastroenterology;   Laterality: N/Cynthia;  300  . ESOPHAGOGASTRODUODENOSCOPY N/Cynthia 04/06/2015   RMR: Normal EGD status post Maloney dilation of teh esophagus as described above  . LAPAROSCOPIC BILATERAL SALPINGO OOPHERECTOMY Bilateral 11/22/2015   Procedure: LAPAROSCOPIC BILATERAL SALPINGO OOPHORECTOMY;  Surgeon: Tilda Burrow, MD;  Location: [facility];  Service: Gynecology;  Laterality: Bilateral;  . LUMBAR DISC SURGERY  2005  . SHOULDER SURGERY  1997 RIGHT  . TOTAL ABDOMINAL HYSTERECTOMY  2000 fibroids/DUB  . UPPER GASTROINTESTINAL ENDOSCOPY  2008 RMR   SML HH o/w NL  . vocal cord polyp removal  2013   Dr. Josephina Shih    Family History  Problem Relation Age of Onset  . Breast cancer Maternal Aunt   . Anxiety disorder Maternal Aunt   . Anxiety disorder Mother   . Stroke Mother   . COPD Mother   . Hypertension Mother   . Thyroid disease Mother   . Diabetes Mother   . Drug abuse Daughter   . Anxiety disorder Daughter   . Seizures Sister   . Alcohol abuse Maternal Uncle   . Mental illness Cousin   . Dementia Maternal Aunt   . Anxiety disorder Sister   . Stroke Sister   . Anxiety disorder Sister   . Drug abuse Sister   . Early death Sister   . Heart disease Sister   . Heart attack Sister   . Stroke Maternal Grandmother   . Colon polyps Neg Hx   .  Colon cancer Neg Hx   . ADD / ADHD Neg Hx   . Bipolar disorder Neg Hx   . Depression Neg Hx   . OCD Neg Hx   . Paranoid behavior Neg Hx   . Schizophrenia Neg Hx   . Sexual abuse Neg Hx   . Physical abuse Neg Hx     Social History Social History  Substance Use Topics  . Smoking status: Former Smoker    Packs/day: 0.50    Years: 2.00    Types: Cigarettes    Quit date: 05/11/2015  . Smokeless tobacco: Never Used  . Alcohol use No    Allergies  Allergen Reactions  . Neurontin [Gabapentin] Other (See Comments)    Out of it and could not function in this while on all the other bp meds.    Current Outpatient Prescriptions  Medication Sig Dispense  Refill  . ALPRAZolam (XANAX) 0.5 MG tablet Take 1 tablet (0.5 mg total) by mouth 3 (three) times daily as needed for sleep or anxiety. 90 tablet 2  . aspirin 81 MG tablet Take 81 mg by mouth every morning.     Marland Kitchen atorvastatin (LIPITOR) 10 MG tablet Take 1 tablet by mouth daily.    Marland Kitchen buPROPion (WELLBUTRIN XL) 150 MG 24 hr tablet Take 2 tablets (300 mg total) by mouth every morning. 60 tablet 0  . Butalbital-APAP-Caffeine (FIORICET PO) Take by mouth. 1 tablet every 8 hour prn headache    . Cholecalciferol (VITAMIN D PO) Take 5,000 Units by mouth daily.    . COMBIVENT RESPIMAT 20-100 MCG/ACT AERS respimat     . Cyanocobalamin (VITAMIN B-12 PO) Take by mouth daily.    Marland Kitchen DEXILANT 60 MG capsule TAKE 1 CAPSULE BY MOUTH ONCE Cynthia DAY. 30 capsule 3  . DULoxetine (CYMBALTA) 60 MG capsule Take 1 capsule (60 mg total) by mouth daily. 30 capsule 0  . fexofenadine (ALLEGRA) 180 MG tablet Take 180 mg by mouth every morning.     . fluconazole (DIFLUCAN) 100 MG tablet Take 1 tablet (100 mg total) by mouth daily. 10 tablet 0  . furosemide (LASIX) 20 MG tablet Take 20 mg by mouth 2 (two) times daily.      Marland Kitchen lidocaine (LIDODERM) 5 % Place 1 patch onto the skin every 12 (twelve) hours as needed (for pain). Reported on 03/16/2016    . Liraglutide (VICTOZA) 18 MG/3ML SOPN Inject 1.8 mg into the skin every evening.     Marland Kitchen LYRICA 75 MG capsule Take 75 mg by mouth 2 (two) times daily.     . meloxicam (MOBIC) 15 MG tablet 15 mg.    . metFORMIN (GLUCOPHAGE-XR) 500 MG 24 hr tablet Take by mouth. Taking 4 Tablets Daily    . metoprolol (LOPRESSOR) 100 MG tablet Take 100 mg by mouth 2 (two) times daily.     . Multiple Vitamin (MULTIVITAMIN) capsule Take 1 capsule by mouth daily.      Marland Kitchen nystatin-triamcinolone ointment (MYCOLOG) Apply 1 application topically 2 (two) times daily. 30 g 1  . oxycodone (ROXICODONE) 30 MG immediate release tablet Take 30 mg by mouth every 6 (six) hours as needed. For pain    . pantoprazole (PROTONIX) 40  MG tablet TAKE (1) TABLET BY MOUTH ONCE DAILY. 30 tablet 2  . Probiotic Product (PHILLIPS COLON HEALTH PO) Take 1 capsule by mouth daily.    . valsartan-hydrochlorothiazide (DIOVAN-HCT) 160-25 MG per tablet Take 1 tablet by mouth daily.    . VOLTAREN 1 %  GEL Apply 2 g topically daily as needed (pain).     Marland Kitchen. zolpidem (AMBIEN CR) 12.5 MG CR tablet Take 1 tablet (12.5 mg total) by mouth at bedtime as needed for sleep. 30 tablet 2   No current facility-administered medications for this visit.      Physical Exam  Blood pressure 122/86, pulse 87, height 5\' 11"  (1.803 m), weight 191 lb (86.6 kg).  Constitutional: overall normal hygiene, normal nutrition, well developed, normal grooming, normal body habitus. Assistive device:none  Musculoskeletal: gait and station Limp none, muscle tone and strength are normal, no tremors or atrophy is present.  .  Neurological: coordination overall normal.  Deep tendon reflex/nerve stretch intact.  Sensation normal.  Cranial nerves II-XII intact.   Skin:   Normal overall no scars, lesions, ulcers or rashes. No psoriasis.  Psychiatric: Alert and oriented x 3.  Recent memory intact, remote memory unclear.  Normal mood and affect. Well groomed.  Good eye contact.  Cardiovascular: overall no swelling, no varicosities, no edema bilaterally, normal temperatures of the legs and arms, no clubbing, cyanosis and good capillary refill.  Lymphatic: palpation is normal.  Examination of left Upper Extremity is done.  Inspection:   Overall:  Elbow non-tender without crepitus or defects, forearm non-tender without crepitus or defects, wrist non-tender without crepitus or defects, hand non-tender.    Shoulder: with glenohumeral joint tenderness, without effusion.   Upper arm: without swelling and tenderness   Range of motion:   Overall:  Full range of motion of the elbow, full range of motion of wrist and full range of motion in fingers.   Shoulder:  left  165 degrees  forward flexion; 145 degrees abduction; 30 degrees internal rotation, 30 degrees external rotation, 15 degrees extension, 40 degrees adduction.   Stability:   Overall:  Shoulder, elbow and wrist stable   Strength and Tone:   Overall full shoulder muscles strength, full upper arm strength and normal upper arm bulk and tone.   The patient has been educated about the nature of the problem(s) and counseled on treatment options.  The patient appeared to understand what I have discussed and is in agreement with it.  Encounter Diagnosis  Name Primary?  . Chronic left shoulder pain Yes    PLAN Call if any problems.  Precautions discussed.  Continue current medications.   Return to clinic after MRI of the left shoulder   Electronically Signed Darreld McleanWayne Rudi Bunyard, MD 6/6/201811:10 AM

## 2017-05-07 ENCOUNTER — Ambulatory Visit (INDEPENDENT_AMBULATORY_CARE_PROVIDER_SITE_OTHER): Payer: Medicare Other | Admitting: Orthopaedic Surgery

## 2017-05-07 VITALS — BP 152/92 | HR 72 | Ht 71.0 in | Wt 197.0 lb

## 2017-05-07 DIAGNOSIS — G8929 Other chronic pain: Secondary | ICD-10-CM

## 2017-05-07 DIAGNOSIS — M25512 Pain in left shoulder: Secondary | ICD-10-CM

## 2017-05-07 MED ORDER — HYDROCODONE-ACETAMINOPHEN 5-325 MG PO TABS: ORAL_TABLET | ORAL | 0 refills | Status: DC

## 2017-05-07 NOTE — Progress Notes (Signed)
[name], female DOB:[birth year], 51 y.o. VWU:[id number]  Chief Complaint  Cynthia presents with  . Results    MRI Left Shoulder    HPI  [name] is a 51 y.o. female who has pain in the left shoulder.  She has had MRI done at [facility] and it showed:  Supraspinatus tendinosis, moderate hypertrophic AC joint arthrosis and mild subacromial subdeltoid bursitis and intra-articular long head biceps tendinosis.  I have explained the findings to her.  I have recommended swimming.  I have set up PT for the left shoulder. HPI  Body mass index is 27.48 kg/m.  ROS  Review of Systems  Eyes: Positive for visual disturbance.  Cardiovascular: Positive for leg swelling.  Musculoskeletal: Positive for arthralgias and back pain.  Allergic/Immunologic: Positive for environmental allergies.  Neurological: Positive for weakness and numbness.  Psychiatric/Behavioral: The Cynthia is nervous/anxious.   All other systems reviewed and are negative.   Past Medical History:  Diagnosis Date  . BMI (body mass index) 20.0-29.9 2008 192 lbs  . Complication of anesthesia    post op N/V  . Depression   . Diabetes mellitus   . GERD (gastroesophageal reflux disease) 2002   202 lbs  . Hypertension   . Irritable bowel syndrome 2008   diarrhea predominant MEDS TRIED: BENTYL, LEVBID  . LBP (low back pain)   . PONV (postoperative nausea and vomiting)   . PTSD (post-traumatic stress disorder)   . RUQ abdominal pain 2004 HIDA NL CTA w/ IVC-FATTY LIVER, ANGIOMYOLIPOMA LEFT KIDNEY   2008 HIDA NL    Past Surgical History:  Procedure Laterality Date  . ANKLE SURGERY  RIGHT  . BREAST REDUCTION SURGERY  2006  . COLONOSCOPY  2008 RMR   PAN-COLONIC TICS, NL TI  . COLONOSCOPY  2013   Dr. Teena Dunk: diverticulum in ascending colon, mild diverticulosis in sigmoid. Recommended 5 year surveillance  . ESOPHAGEAL DILATION N/A 04/06/2015   Procedure: ESOPHAGEAL DILATION;  Surgeon: Corbin Ade, MD;   Location: [facility];  Service: Gastroenterology;  Laterality: N/A;  300  . ESOPHAGOGASTRODUODENOSCOPY N/A 04/06/2015   RMR: Normal EGD status post Maloney dilation of teh esophagus as described above  . LAPAROSCOPIC BILATERAL SALPINGO OOPHERECTOMY Bilateral 11/22/2015   Procedure: LAPAROSCOPIC BILATERAL SALPINGO OOPHORECTOMY;  Surgeon: Tilda Burrow, MD;  Location: [facility];  Service: Gynecology;  Laterality: Bilateral;  . LUMBAR DISC SURGERY  2005  . SHOULDER SURGERY  1997 RIGHT  . TOTAL ABDOMINAL HYSTERECTOMY  2000 fibroids/DUB  . UPPER GASTROINTESTINAL ENDOSCOPY  2008 RMR   SML HH o/w NL  . vocal cord polyp removal  2013   Dr. Josephina Shih    Family History  Problem Relation Age of Onset  . Breast cancer Maternal Aunt   . Anxiety disorder Maternal Aunt   . Anxiety disorder Mother   . Stroke Mother   . COPD Mother   . Hypertension Mother   . Thyroid disease Mother   . Diabetes Mother   . Drug abuse Daughter   . Anxiety disorder Daughter   . Seizures Sister   . Alcohol abuse Maternal Uncle   . Mental illness Cousin   . Dementia Maternal Aunt   . Anxiety disorder Sister   . Stroke Sister   . Anxiety disorder Sister   . Drug abuse Sister   . Early death Sister   . Heart disease Sister   . Heart attack Sister   . Stroke Maternal Grandmother   . Colon polyps  Neg Hx   . Colon cancer Neg Hx   . ADD / ADHD Neg Hx   . Bipolar disorder Neg Hx   . Depression Neg Hx   . OCD Neg Hx   . Paranoid behavior Neg Hx   . Schizophrenia Neg Hx   . Sexual abuse Neg Hx   . Physical abuse Neg Hx     Social History Social History  Substance Use Topics  . Smoking status: Former Smoker    Packs/day: 0.50    Years: 2.00    Types: Cigarettes    Quit date: 05/11/2015  . Smokeless tobacco: Never Used  . Alcohol use No    Allergies  Allergen Reactions  . Neurontin [Gabapentin] Other (See Comments)    Out of it and could not function in this while on all the other bp meds.     Current Outpatient Prescriptions  Medication Sig Dispense Refill  . ALPRAZolam (XANAX) 0.5 MG tablet Take 1 tablet (0.5 mg total) by mouth 3 (three) times daily as needed for sleep or anxiety. 90 tablet 2  . aspirin 81 MG tablet Take 81 mg by mouth every morning.     Marland Kitchen. atorvastatin (LIPITOR) 10 MG tablet Take 1 tablet by mouth daily.    Marland Kitchen. buPROPion (WELLBUTRIN XL) 150 MG 24 hr tablet Take 2 tablets (300 mg total) by mouth every morning. 60 tablet 0  . Butalbital-APAP-Caffeine (FIORICET PO) Take by mouth. 1 tablet every 8 hour prn headache    . Cholecalciferol (VITAMIN D PO) Take 5,000 Units by mouth daily.    . COMBIVENT RESPIMAT 20-100 MCG/ACT AERS respimat     . Cyanocobalamin (VITAMIN B-12 PO) Take by mouth daily.    Marland Kitchen. DEXILANT 60 MG capsule TAKE 1 CAPSULE BY MOUTH ONCE A DAY. 30 capsule 3  . DULoxetine (CYMBALTA) 60 MG capsule Take 1 capsule (60 mg total) by mouth daily. 30 capsule 0  . fexofenadine (ALLEGRA) 180 MG tablet Take 180 mg by mouth every morning.     . fluconazole (DIFLUCAN) 100 MG tablet Take 1 tablet (100 mg total) by mouth daily. 10 tablet 0  . furosemide (LASIX) 20 MG tablet Take 20 mg by mouth 2 (two) times daily.      Marland Kitchen. lidocaine (LIDODERM) 5 % Place 1 patch onto the skin every 12 (twelve) hours as needed (for pain). Reported on 03/16/2016    . Liraglutide (VICTOZA) 18 MG/3ML SOPN Inject 1.8 mg into the skin every evening.     Marland Kitchen. LYRICA 75 MG capsule Take 75 mg by mouth 2 (two) times daily.     . meloxicam (MOBIC) 15 MG tablet 15 mg.    . metFORMIN (GLUCOPHAGE-XR) 500 MG 24 hr tablet Take by mouth. Taking 4 Tablets Daily    . metoprolol (LOPRESSOR) 100 MG tablet Take 100 mg by mouth 2 (two) times daily.     . Multiple Vitamin (MULTIVITAMIN) capsule Take 1 capsule by mouth daily.      Marland Kitchen. nystatin-triamcinolone ointment (MYCOLOG) Apply 1 application topically 2 (two) times daily. 30 g 1  . oxycodone (ROXICODONE) 30 MG immediate release tablet Take 30 mg by mouth every 6  (six) hours as needed. For pain    . pantoprazole (PROTONIX) 40 MG tablet TAKE (1) TABLET BY MOUTH ONCE DAILY. 30 tablet 2  . Probiotic Product (PHILLIPS COLON HEALTH PO) Take 1 capsule by mouth daily.    . valsartan-hydrochlorothiazide (DIOVAN-HCT) 160-25 MG per tablet Take 1 tablet by mouth daily.    .Marland Kitchen  VOLTAREN 1 % GEL Apply 2 g topically daily as needed (pain).     Marland Kitchen zolpidem (AMBIEN CR) 12.5 MG CR tablet Take 1 tablet (12.5 mg total) by mouth at bedtime as needed for sleep. 30 tablet 2   No current facility-administered medications for this visit.      Physical Exam  Blood pressure (!) 152/92, pulse 72, height 5\' 11"  (1.803 m), weight 197 lb (89.4 kg).  Constitutional: overall normal hygiene, normal nutrition, well developed, normal grooming, normal body habitus. Assistive device:none  Musculoskeletal: gait and station Limp none, muscle tone and strength are normal, no tremors or atrophy is present.  .  Neurological: coordination overall normal.  Deep tendon reflex/nerve stretch intact.  Sensation normal.  Cranial nerves II-XII intact.   Skin:   Normal overall no scars, lesions, ulcers or rashes. No psoriasis.  Psychiatric: Alert and oriented x 3.  Recent memory intact, remote memory unclear.  Normal mood and affect. Well groomed.  Good eye contact.  Cardiovascular: overall no swelling, no varicosities, no edema bilaterally, normal temperatures of the legs and arms, no clubbing, cyanosis and good capillary refill.  Lymphatic: palpation is normal.  Her left shoulder is tender but has full motion, painful in the extremes.  NV intact.  Grips normal. Muscle strength and tone normal.  The Cynthia has been educated about the nature of the problem(s) and counseled on treatment options.  The Cynthia appeared to understand what I have discussed and is in agreement with it.  Encounter Diagnosis  Name Primary?  . Chronic left shoulder pain Yes    PLAN Call if any problems.   Precautions discussed.  Continue current medications.   Return to clinic 3 weeks  I have reviewed the [facility] Controlled Substance Reporting System web site prior to prescribing narcotic medicine for this Cynthia.  Begin PT.  Electronically Signed Darreld Mclean, MD 6/19/20184:24 PM

## 2017-05-13 ENCOUNTER — Ambulatory Visit (INDEPENDENT_AMBULATORY_CARE_PROVIDER_SITE_OTHER): Payer: Medicare Other | Admitting: Otolaryngology

## 2017-05-13 DIAGNOSIS — H6983 Other specified disorders of Eustachian tube, bilateral: Secondary | ICD-10-CM

## 2017-05-13 DIAGNOSIS — R42 Dizziness and giddiness: Secondary | ICD-10-CM

## 2017-05-15 ENCOUNTER — Other Ambulatory Visit (HOSPITAL_COMMUNITY): Payer: Self-pay | Admitting: Psychiatry

## 2017-05-27 ENCOUNTER — Ambulatory Visit (HOSPITAL_COMMUNITY): Payer: Medicare Other | Attending: Orthopaedic Surgery | Admitting: Occupational Therapy

## 2017-05-27 ENCOUNTER — Encounter (HOSPITAL_COMMUNITY): Payer: Self-pay | Admitting: Occupational Therapy

## 2017-05-27 DIAGNOSIS — M25612 Stiffness of left shoulder, not elsewhere classified: Secondary | ICD-10-CM

## 2017-05-27 DIAGNOSIS — G8929 Other chronic pain: Secondary | ICD-10-CM

## 2017-05-27 DIAGNOSIS — R29898 Other symptoms and signs involving the musculoskeletal system: Secondary | ICD-10-CM | POA: Diagnosis present

## 2017-05-27 DIAGNOSIS — M25512 Pain in left shoulder: Secondary | ICD-10-CM | POA: Diagnosis present

## 2017-05-27 NOTE — Patient Instructions (Signed)

## 2017-05-27 NOTE — Therapy (Addendum)
[facility] [address] [place], [place], [address] Phone: [phone]   Fax:  [phone]  Occupational Therapy Evaluation  Patient Details  Name: [name] MRN: [id number] Date of Birth: [birth year] Referring Provider: Darreld Mclean, MD  Encounter Date: 05/27/2017      OT End of Session - 05/27/17 1633    Visit Number 1   Number of Visits 12   Date for OT Re-Evaluation 06/26/17   Authorization Type UHC Medicare - Medicaid secondary   Authorization Time Period before 10th visit   Authorization - Visit Number 1   Authorization - Number of Visits 10   OT Start Time 1435   OT Stop Time 1510   OT Time Calculation (min) 35 min   Equipment Utilized During Treatment N/A   Activity Tolerance Patient tolerated treatment well   Behavior During Therapy [facility] for tasks assessed/performed      Past Medical History:  Diagnosis Date  . BMI (body mass index) 20.0-29.9 2008 192 lbs  . Complication of anesthesia    post op N/V  . Depression   . Diabetes mellitus   . GERD (gastroesophageal reflux disease) 2002   202 lbs  . Hypertension   . Irritable bowel syndrome 2008   diarrhea predominant MEDS TRIED: BENTYL, LEVBID  . LBP (low back pain)   . PONV (postoperative nausea and vomiting)   . PTSD (post-traumatic stress disorder)   . RUQ abdominal pain 2004 HIDA NL CTA w/ IVC-FATTY LIVER, ANGIOMYOLIPOMA LEFT KIDNEY   2008 HIDA NL    Past Surgical History:  Procedure Laterality Date  . ANKLE SURGERY  RIGHT  . BREAST REDUCTION SURGERY  2006  . COLONOSCOPY  2008 RMR   PAN-COLONIC TICS, NL TI  . COLONOSCOPY  2013   Dr. Teena Dunk: diverticulum in ascending colon, mild diverticulosis in sigmoid. Recommended 5 year surveillance  . ESOPHAGEAL DILATION N/A 04/06/2015   Procedure: ESOPHAGEAL DILATION;  Surgeon: Corbin Ade, MD;  [facility];  Service: Gastroenterology;  Laterality: N/A;  300  . ESOPHAGOGASTRODUODENOSCOPY N/A  04/06/2015   RMR: Normal EGD status post Maloney dilation of teh esophagus as described above  . LAPAROSCOPIC BILATERAL SALPINGO OOPHERECTOMY Bilateral 11/22/2015   Procedure: LAPAROSCOPIC BILATERAL SALPINGO OOPHORECTOMY;  Surgeon: Tilda Burrow, MD;  Location: [facility];  Service: Gynecology;  Laterality: Bilateral;  . LUMBAR DISC SURGERY  2005  . SHOULDER SURGERY  1997 RIGHT  . TOTAL ABDOMINAL HYSTERECTOMY  2000 fibroids/DUB  . UPPER GASTROINTESTINAL ENDOSCOPY  2008 RMR   SML HH o/w NL  . vocal cord polyp removal  2013   Dr. Josephina Shih    There were no vitals filed for this visit.      Subjective Assessment - 05/27/17 1620    Subjective  S: My shoulder is hurting all the time, I tried to ignore it but it's gotten worse.    Pertinent History Pt is a 51 y/o female presenting with left shoulder pain, MRI shows supraspinatus tendonosis, mild bursitis and long-head biceps tendonitis. Pt has been experiencing pain since approximately three months ago from an unknown cause. Dr. Darreld Mclean referred pt to occupational therapy for evaluation and treatment.    Patient Stated Goals Pt states wanting to return to doing things for herself and wanting to be able to roll over in bed without pain.   Currently in Pain? Yes   Pain Score 9    Pain Location Shoulder   Pain Orientation Left  Pain Descriptors / Indicators Constant;Sore;Throbbing   Pain Type Chronic pain   Pain Radiating Towards elbow   Pain Onset More than a month ago   Pain Frequency Constant   Aggravating Factors  use of arm   Pain Relieving Factors rest   Effect of Pain on Daily Activities severe effect   Multiple Pain Sites No           [facility] OT Assessment - 05/27/17 1440      Assessment   Diagnosis left shoulder pain   Referring Provider Darreld Mclean, MD   Onset Date 02/25/17   Assessment 05/28/17   Prior Therapy none     Precautions   Precautions None     Restrictions   Weight Bearing Restrictions No      Balance Screen   Has the patient fallen in the past 6 months No     Home  Environment   Family/patient expects to be discharged to: Private residence   Lives With Daughter;Other (Comment)  grandchildren     Prior Function   Level of Independence Independent   Vocation On disability   Development worker, community, Occupational psychologist   Leisure shopping, walking     ADL   ADL comments Pt reports having problems washing hair, clipping bra, lifting arm and bathing.      Mobility   Mobility Status Independent     Written Expression   Dominant Hand Right     Vision - History   Baseline Vision Wears glasses only for reading     Cognition   Overall Cognitive Status Within Functional Limits for tasks assessed     ROM / Strength   AROM / PROM / Strength AROM;PROM;Strength     Palpation   Palpation comment Pt presents mod fascial restrcitions, tightness and tenderness in the upper left shoulder, trapezius and scapular regions.     AROM   Overall AROM  Deficits   Overall AROM Comments Assessed seated. IR/er adducted.   AROM Assessment Site Shoulder   Right/Left Shoulder Left   Left Shoulder Flexion 105 Degrees   Left Shoulder ABduction 79 Degrees   Left Shoulder Internal Rotation 90 Degrees   Left Shoulder External Rotation 56 Degrees     PROM   Overall PROM  Deficits   Overall PROM Comments Assessed supine. IR/er adducted.   PROM Assessment Site Shoulder   Right/Left Shoulder Left   Left Shoulder Flexion 123 Degrees   Left Shoulder ABduction 83 Degrees   Left Shoulder Internal Rotation 90 Degrees   Left Shoulder External Rotation 77 Degrees     Strength   Overall Strength --   Overall Strength Comments Assessed seated. IR/er adducted.   Strength Assessment Site Shoulder   Right/Left Shoulder Left   Left Shoulder Flexion 3/5   Left Shoulder ABduction 3-/5   Left Shoulder Internal Rotation 3/5   Left Shoulder External Rotation 3/5                 OT Education - 05/27/17 1633    Education provided Yes   Education Details Educated pt on need for therapy, provided pt with HEP for shoulder stretches   Person(s) Educated Patient   Methods Explanation;Demonstration;Verbal cues;Handout   Comprehension Verbalized understanding;Returned demonstration          OT Short Term Goals - 05/27/17 1643      OT SHORT TERM GOAL #1   Title Pt will be educated on HEP to improve mobility required for LUE  use during daily tasks.    Time 3   Period Weeks   Status New     OT SHORT TERM GOAL #2   Title Pt will decrease pain in LUE to 7/10 or less to improve ability to use LUE during B/IADL task completion.    Time 3   Period Weeks   Status New     OT SHORT TERM GOAL #3   Title Pt will improve P/ROM in LUE to [facility] to improve ability to donn and doff shirt and other clothing articles.   Time 3   Period Weeks   Status New           OT Long Term Goals - 05/27/17 1646      OT LONG TERM GOAL #1   Title Pt will decrease LUE fascial restrictions from moderate to minimal amounts to improve mobility required for overhead reaching tasks.    Time 6   Period Weeks   Status New     OT LONG TERM GOAL #2   Title Pt will improve A/ROM in LUE to [facility] to improve ability to reach behind back and fasten/unfasten bra.    Time 6   Period Weeks   Status New     OT LONG TERM GOAL #3   Title Pt will decrease pain in LUE to 4/10 or less to improve ability to use LUE during B/IADL task completion.    Time 6   Period Weeks   Status New     OT LONG TERM GOAL #4   Title Pt will improve LUE strength to 4/5 or more to increase ability to use LUE during heavy work tasks such as lifting pots/pans during cooking.   Time 6   Period Weeks   Status New     OT LONG TERM GOAL #5   Title Pt will return to highest level of functioning using LUE to assist in ADL completion for tasks such as wahing hair and clipping her bra.   Time 6   Period  Weeks   Status New               Plan - 05/27/17 1636    Clinical Impression Statement A: Pt is a 51 y/o female presenting with left shoulder pain present since approximately three months ago limiting functional use of LUE during daily and ADL tasks resulting in decreased ROM and strength as well as increased fascial restrictions. Pt was provided with shoulder stretches for HEP.    Occupational Profile and client history currently impacting functional performance Pt is motivated to return to prior level of function, pt has received similar therapy to RUE before and understands process   Occupational performance deficits (Please refer to evaluation for details): ADL's;IADL's;Rest and Sleep;Work;Leisure   Rehab Potential Good   Current Impairments/barriers affecting progress: Pt does not have full ROM in right arm after finishing previous therapy    OT Frequency 2x / week   OT Duration 6 weeks   OT Treatment/Interventions Self-care/ADL training;Therapeutic exercise;Patient/family education;Ultrasound;Manual Therapy;Iontophoresis;Cryotherapy;DME and/or AE instruction;Therapeutic activities;Electrical Stimulation;Moist Heat;Passive range of motion   Plan P: Pt will benefit from skilled OT services to decrease pain and fascial restrictions, increase joint range of motion, strength, and functional use of LUE. Treatment plan: myofascial release, manual therapy, P/ROM, AA/ROM, A/ROM, scapular mobility and strengthening, modalities as needed.    Clinical Decision Making Limited treatment options, no task modification necessary   OT Home Exercise Plan 05/27/17: Shoulder stretches   Consulted and  Agree with Plan of Care Patient      Patient will benefit from skilled therapeutic intervention in order to improve the following deficits and impairments:  Decreased strength, Impaired flexibility, Decreased mobility, Decreased range of motion, Pain, Increased fascial restricitons, Impaired UE functional  use  Visit Diagnosis: Chronic left shoulder pain  Other symptoms and signs involving the musculoskeletal system  Stiffness of left shoulder, not elsewhere classified      G-Codes - 2017-05-28 1702    Functional Assessment Tool Used (Outpatient only) Clinical judgement   Functional Limitation Carrying, moving and handling objects   Carrying, Moving and Handling Objects Current Status (Z6109) At least 40 percent but less than 60 percent impaired, limited or restricted   Carrying, Moving and Handling Objects Goal Status (U0454) At least 1 percent but less than 20 percent impaired, limited or restricted      Problem List Patient Active Problem List   Diagnosis Date Noted  . Shoulder pain, left 04/02/2017  . Postop check 12/02/2015  . Pelvic peritoneal adhesions, female,postsurgical 11/22/2015  . Chronic pelvic pain in female 11/22/2015  . Tubo-ovarian adhesions postsurgical 11/22/2015  . Postoperative female pelvic peritoneal adhesions 11/22/2015  . Para-ovarian adhesion left 11/17/2015  . Globus sensation 05/18/2015  . Dysphagia, pharyngoesophageal phase 03/29/2015  . Perimenopausal vasomotor symptoms 09/16/2014  . Torticollis 09/16/2013  . Migraine without aura 09/16/2013  . Torticollis, spasmodic 05/26/2013  . Rotator cuff syndrome of right shoulder 05/26/2013  . Pain in joint, upper arm 05/26/2013  . Dyspepsia 03/17/2013  . Dysthymia 01/30/2013  . Pain syndrome, chronic 01/30/2013  . Arthritis of knee 06/21/2011  . Pain in left knee 06/21/2011  . IBS (irritable bowel syndrome) 03/29/2011  . FATIGUE 12/12/2010  . DYSPNEA ON EXERTION 12/12/2010  . GERD 10/21/2008  . Palpitations 10/21/2008  . MURMUR 10/21/2008  . SUBLUXATION PATELLAR (MALALIGNMENT) 04/01/2008  . KNEE PAIN 01/05/2008  . BACK PAIN 01/05/2008    Ricci Barker, OT Student [phone] 28-May-2017, 5:03 PM  [facility] [address] [place], [place],  [address] Phone: [phone]   Fax:  [phone]  Name: [name] MRN: [id number] Date of Birth: 1966-08-25   This qualified practitioner was present in the room guiding the student in service delivery. Therapy student was participating in the provision of services, and the practitioner was not engaged in treating another patient or doing other tasks at the same time.  Ezra Sites, OTR/L  [phone] 05/28/17

## 2017-05-28 ENCOUNTER — Ambulatory Visit: Payer: Self-pay | Admitting: Orthopaedic Surgery

## 2017-06-05 ENCOUNTER — Ambulatory Visit (HOSPITAL_COMMUNITY): Payer: Medicare Other | Admitting: Occupational Therapy

## 2017-06-05 ENCOUNTER — Telehealth (HOSPITAL_COMMUNITY): Payer: Self-pay | Admitting: Occupational Therapy

## 2017-06-05 NOTE — Telephone Encounter (Signed)
Called pt re: no-show. Pt forgot to call and cancel, there has been a death in the family and she needs to cancel her 7/20 appt too. Pt will resume appointments the next week.    Ezra SitesLeslie Troxler, OTR/L  [phone] 06/05/2017

## 2017-06-06 ENCOUNTER — Ambulatory Visit: Payer: Self-pay | Admitting: Orthopaedic Surgery

## 2017-06-07 ENCOUNTER — Ambulatory Visit (HOSPITAL_COMMUNITY): Payer: Medicare Other | Admitting: Occupational Therapy

## 2017-06-12 ENCOUNTER — Encounter (HOSPITAL_COMMUNITY): Payer: Self-pay

## 2017-06-12 ENCOUNTER — Ambulatory Visit (HOSPITAL_COMMUNITY): Payer: Medicare Other

## 2017-06-12 DIAGNOSIS — M25512 Pain in left shoulder: Secondary | ICD-10-CM | POA: Diagnosis not present

## 2017-06-12 DIAGNOSIS — R29898 Other symptoms and signs involving the musculoskeletal system: Secondary | ICD-10-CM

## 2017-06-12 DIAGNOSIS — G8929 Other chronic pain: Secondary | ICD-10-CM

## 2017-06-12 DIAGNOSIS — M25612 Stiffness of left shoulder, not elsewhere classified: Secondary | ICD-10-CM

## 2017-06-12 NOTE — Therapy (Signed)
[facility] [address] [place], [place], [address] Phone: [phone]   Fax:  [phone]  Occupational Therapy Treatment  Patient Details  Name: [name] MRN: [id number] Date of Birth: 1966/04/15 Referring Provider: Darreld Mclean, MD  Encounter Date: 06/12/2017      OT End of Session - 06/12/17 1527    Visit Number 2   Number of Visits 12   Date for OT Re-Evaluation 06/26/17   Authorization Type UHC Medicare - Medicaid secondary   Authorization Time Period before 10th visit   Authorization - Visit Number 2   Authorization - Number of Visits 10   OT Start Time 1432   OT Stop Time 1512   OT Time Calculation (min) 40 min   Activity Tolerance Patient tolerated treatment well   Behavior During Therapy [facility] for tasks assessed/performed      Past Medical History:  Diagnosis Date  . BMI (body mass index) 20.0-29.9 2008 192 lbs  . Complication of anesthesia    post op N/V  . Depression   . Diabetes mellitus   . GERD (gastroesophageal reflux disease) 2002   202 lbs  . Hypertension   . Irritable bowel syndrome 2008   diarrhea predominant MEDS TRIED: BENTYL, LEVBID  . LBP (low back pain)   . PONV (postoperative nausea and vomiting)   . PTSD (post-traumatic stress disorder)   . RUQ abdominal pain 2004 HIDA NL CTA w/ IVC-FATTY LIVER, ANGIOMYOLIPOMA LEFT KIDNEY   2008 HIDA NL    Past Surgical History:  Procedure Laterality Date  . ANKLE SURGERY  RIGHT  . BREAST REDUCTION SURGERY  2006  . COLONOSCOPY  2008 RMR   PAN-COLONIC TICS, NL TI  . COLONOSCOPY  2013   Dr. Teena Dunk: diverticulum in ascending colon, mild diverticulosis in sigmoid. Recommended 5 year surveillance  . ESOPHAGEAL DILATION N/A 04/06/2015   Procedure: ESOPHAGEAL DILATION;  Surgeon: Corbin Ade, MD;  [facility];  Service: Gastroenterology;  Laterality: N/A;  300  . ESOPHAGOGASTRODUODENOSCOPY N/A 04/06/2015   RMR: Normal EGD status post Maloney  dilation of teh esophagus as described above  . LAPAROSCOPIC BILATERAL SALPINGO OOPHERECTOMY Bilateral 11/22/2015   Procedure: LAPAROSCOPIC BILATERAL SALPINGO OOPHORECTOMY;  Surgeon: Tilda Burrow, MD;  Location: [facility];  Service: Gynecology;  Laterality: Bilateral;  . LUMBAR DISC SURGERY  2005  . SHOULDER SURGERY  1997 RIGHT  . TOTAL ABDOMINAL HYSTERECTOMY  2000 fibroids/DUB  . UPPER GASTROINTESTINAL ENDOSCOPY  2008 RMR   SML HH o/w NL  . vocal cord polyp removal  2013   Dr. Josephina Shih    There were no vitals filed for this visit.      Subjective Assessment - 06/12/17 1503    Subjective  S: The pain is about the same.   Special Tests FOTO score: 46/100   Currently in Pain? Yes   Pain Score 8    Pain Location Shoulder   Pain Orientation Left   Pain Descriptors / Indicators Constant;Sore;Throbbing   Pain Type Chronic pain   Pain Radiating Towards elbow   Pain Onset More than a month ago   Pain Frequency Constant   Aggravating Factors  use of arm   Pain Relieving Factors rest   Effect of Pain on Daily Activities severe effect   Multiple Pain Sites No            [facility] OT Assessment - 06/12/17 1506      Assessment   Diagnosis left shoulder  pain   Prior Therapy none                  OT Treatments/Exercises (OP) - 06/12/17 1506      Exercises   Exercises Shoulder     Shoulder Exercises: Supine   Protraction PROM;AROM;10 reps   Horizontal ABduction PROM;AROM;10 reps   External Rotation PROM;AROM;10 reps   Internal Rotation PROM;AROM;10 reps   Flexion PROM;AROM;10 reps   ABduction PROM;AROM;10 reps     Manual Therapy   Manual Therapy Myofascial release   Manual therapy comments manual therapy completed seperately from all other interventions this date of service   Myofascial Release Myofascial release and manual stretching completed to left upper arm, trapezius and scapularis region to decrease fascial restrictions and increase ROM in a pain-free  zone.                 OT Education - 06/12/17 1504    Education provided Yes   Education Details Educated pt on goals made for therapy   Person(s) Educated Patient   Methods Explanation;Handout   Comprehension Verbalized understanding          OT Short Term Goals - 06/12/17 1528      OT SHORT TERM GOAL #1   Title Pt will be educated on HEP to improve mobility required for LUE use during daily tasks.    Time 3   Period Weeks   Status On-going     OT SHORT TERM GOAL #2   Title Pt will decrease pain in LUE to 7/10 or less to improve ability to use LUE during B/IADL task completion.    Time 3   Period Weeks   Status On-going     OT SHORT TERM GOAL #3   Title Pt will improve P/ROM in LUE to [facility] to improve ability to don and doff shirt and other clothing articles.   Time 3   Period Weeks   Status On-going     OT SHORT TERM GOAL #4   Title Pt will decrease pain in RUE to 3/10 or less to improve ability to complete daily tasks using RUE as dominant.    Time 6   Period Weeks   Status On-going     OT SHORT TERM GOAL #5   Title Pt will increase A/ROM to [facility] to improve ability to reaching up and into cabinets.    Time 6   Period Weeks   Status On-going           OT Long Term Goals - 06/12/17 1527      OT LONG TERM GOAL #1   Title Pt will decrease LUE fascial restrictions from moderate to minimal amounts to improve mobility required for overhead reaching tasks.    Time 6   Period Weeks   Status On-going     OT LONG TERM GOAL #2   Title Pt will improve A/ROM in LUE to [facility] to improve ability to reach behind back and fasten/unfasten bra.    Time 6   Period Weeks   Status On-going     OT LONG TERM GOAL #3   Title Pt will decrease pain in LUE to 4/10 or less to improve ability to use LUE during B/IADL task completion.    Time 6   Period Weeks   Status On-going     OT LONG TERM GOAL #4   Title Pt will improve LUE strength to 4/5 or more to increase  ability to use  LUE during heavy work tasks such as lifting pots/pans during cooking.   Time 6   Period Weeks   Status On-going     OT LONG TERM GOAL #5   Title Pt will return to highest level of functioning using LUE to assist in ADL completion for tasks such as wahing hair and clipping her bra.   Time 6   Period Weeks   Status On-going               Plan - 06/12/17 1528    Clinical Impression Statement A: Initiated myofascial release, manual stretching, P/ROM, and A/ROM shoulder exercises. Pt completed A/ROM exercises with VC needed for form and technique. Pt reports pain with horizontal abduction during A/ROM.   Plan P: Continue with A/ROM exercises. Complete shoulder A/ROM standing. Add bike. Update HEP to A/ROM shoulder exercises.      Patient will benefit from skilled therapeutic intervention in order to improve the following deficits and impairments:  Decreased strength, Impaired flexibility, Decreased mobility, Decreased range of motion, Pain, Increased fascial restricitons, Impaired UE functional use  Visit Diagnosis: Chronic left shoulder pain  Other symptoms and signs involving the musculoskeletal system  Stiffness of left shoulder, not elsewhere classified    Problem List Patient Active Problem List   Diagnosis Date Noted  . Shoulder pain, left 04/02/2017  . Postop check 12/02/2015  . Pelvic peritoneal adhesions, female,postsurgical 11/22/2015  . Chronic pelvic pain in female 11/22/2015  . Tubo-ovarian adhesions postsurgical 11/22/2015  . Postoperative female pelvic peritoneal adhesions 11/22/2015  . Para-ovarian adhesion left 11/17/2015  . Globus sensation 05/18/2015  . Dysphagia, pharyngoesophageal phase 03/29/2015  . Perimenopausal vasomotor symptoms 09/16/2014  . Torticollis 09/16/2013  . Migraine without aura 09/16/2013  . Torticollis, spasmodic 05/26/2013  . Rotator cuff syndrome of right shoulder 05/26/2013  . Pain in joint, upper arm 05/26/2013   . Dyspepsia 03/17/2013  . Dysthymia 01/30/2013  . Pain syndrome, chronic 01/30/2013  . Arthritis of knee 06/21/2011  . Pain in left knee 06/21/2011  . IBS (irritable bowel syndrome) 03/29/2011  . FATIGUE 12/12/2010  . DYSPNEA ON EXERTION 12/12/2010  . GERD 10/21/2008  . Palpitations 10/21/2008  . MURMUR 10/21/2008  . SUBLUXATION PATELLAR (MALALIGNMENT) 04/01/2008  . KNEE PAIN 01/05/2008  . BACK PAIN 01/05/2008    Ricci Barkerhristina Anas Reister, OT Student [phone] 06/12/2017, 4:12 PM  [facility] [address] [place], [place], [address] Phone: [phone]   Fax:  [phone]  Name: Heath LarkFelice A Guillot MRN: [id number] Date of Birth: [birth year]    This qualified practitioner was present in the room guiding the student in service delivery. Therapy student was participating in the provision of services, and the practitioner was not engaged in treating another patient or doing other tasks at the same time.  Limmie PatriciaLaura Essenmacher, OTR/L,CBIS  [phone]

## 2017-06-14 ENCOUNTER — Telehealth (HOSPITAL_COMMUNITY): Payer: Self-pay | Admitting: Internal Medicine

## 2017-06-14 ENCOUNTER — Ambulatory Visit (HOSPITAL_COMMUNITY): Payer: Medicare Other

## 2017-06-14 NOTE — Telephone Encounter (Signed)
06/14/17  daughter called to cx because she said her mom had a migraine and is throwing up

## 2017-06-19 ENCOUNTER — Ambulatory Visit (HOSPITAL_COMMUNITY): Payer: Medicare Other | Admitting: Specialist

## 2017-06-19 ENCOUNTER — Telehealth (HOSPITAL_COMMUNITY): Payer: Self-pay | Admitting: Internal Medicine

## 2017-06-19 NOTE — Telephone Encounter (Signed)
06/19/17  pt going with her daughter to a drs appt.... appt RS for 8/9

## 2017-06-21 ENCOUNTER — Encounter (HOSPITAL_COMMUNITY): Payer: Self-pay

## 2017-06-21 ENCOUNTER — Telehealth (HOSPITAL_COMMUNITY): Payer: Self-pay | Admitting: Internal Medicine

## 2017-06-21 ENCOUNTER — Ambulatory Visit (HOSPITAL_COMMUNITY): Payer: Medicare Other | Attending: Orthopaedic Surgery

## 2017-06-21 DIAGNOSIS — M25512 Pain in left shoulder: Secondary | ICD-10-CM | POA: Insufficient documentation

## 2017-06-21 DIAGNOSIS — M25612 Stiffness of left shoulder, not elsewhere classified: Secondary | ICD-10-CM | POA: Insufficient documentation

## 2017-06-21 DIAGNOSIS — G8929 Other chronic pain: Secondary | ICD-10-CM

## 2017-06-21 DIAGNOSIS — R29898 Other symptoms and signs involving the musculoskeletal system: Secondary | ICD-10-CM | POA: Insufficient documentation

## 2017-06-21 NOTE — Therapy (Addendum)
[place], [place], [address] Phone: [phone]   Fax:  [phone]  Occupational Therapy Treatment  Patient Details  Name: [name] MRN: [id number] Date of Birth: 03/28/1966 Referring Provider: Sanjuana Kava, MD  Encounter Date: 06/21/2017      OT End of Session - 06/21/17 1512    Visit Number 3   Number of Visits 12   Date for OT Re-Evaluation 06/26/17   Authorization Type [employer] Medicare - Medicaid secondary   Authorization Time Period before 10th visit   Authorization - Visit Number 3   Authorization - Number of Visits 10   OT Start Time 1683   OT Stop Time 1515   OT Time Calculation (min) 43 min   Activity Tolerance Patient tolerated treatment well   Behavior During Therapy [facility] for tasks assessed/performed      Past Medical History:  Diagnosis Date  . BMI (body mass index) 20.0-29.9 2008 192 lbs  . Complication of anesthesia    post op N/V  . Depression   . Diabetes mellitus   . GERD (gastroesophageal reflux disease) 2002   202 lbs  . Hypertension   . Irritable bowel syndrome 2008   diarrhea predominant MEDS TRIED: BENTYL, LEVBID  . LBP (low back pain)   . PONV (postoperative nausea and vomiting)   . PTSD (post-traumatic stress disorder)   . RUQ abdominal pain 2004 HIDA NL CTA w/ IVC-FATTY LIVER, ANGIOMYOLIPOMA LEFT KIDNEY   2008 HIDA NL    Past Surgical History:  Procedure Laterality Date  . ANKLE SURGERY  RIGHT  . BREAST REDUCTION SURGERY  2006  . COLONOSCOPY  2008 RMR   PAN-COLONIC TICS, NL TI  . COLONOSCOPY  2013   Dr. Britta Mccreedy: diverticulum in ascending colon, mild diverticulosis in sigmoid. Recommended 5 year surveillance  . ESOPHAGEAL DILATION N/A 04/06/2015   Procedure: ESOPHAGEAL DILATION;  Surgeon: Daneil Dolin, MD;  Location: [facility] ENDO SUITE;  Service: Gastroenterology;  Laterality: N/A;  300  . ESOPHAGOGASTRODUODENOSCOPY N/A 04/06/2015   RMR: Normal EGD status post Maloney  dilation of teh esophagus as described above  . LAPAROSCOPIC BILATERAL SALPINGO OOPHERECTOMY Bilateral 11/22/2015   Procedure: LAPAROSCOPIC BILATERAL SALPINGO OOPHORECTOMY;  Surgeon: Jonnie Kind, MD;  Location: [facility] ORS;  Service: Gynecology;  Laterality: Bilateral;  . [place] SURGERY  2005  . SHOULDER SURGERY  1997 RIGHT  . TOTAL ABDOMINAL HYSTERECTOMY  2000 fibroids/DUB  . UPPER GASTROINTESTINAL ENDOSCOPY  2008 RMR   SML HH o/w NL  . vocal cord polyp removal  2013   Dr. Adriana Reams    There were no vitals filed for this visit.      Subjective Assessment - 06/21/17 1448    Subjective  S: It still hurts but I can move it a lot better.   Currently in Pain? Yes   Pain Score 7    Pain Location Shoulder   Pain Orientation Left   Pain Descriptors / Indicators Constant;Sore;Throbbing   Pain Type Chronic pain   Pain Radiating Towards elbow   Pain Onset More than a month ago   Pain Frequency Constant   Aggravating Factors  use of arm   Pain Relieving Factors rest   Effect of Pain on Daily Activities severe   Multiple Pain Sites No            [facility] OT Assessment - 06/21/17 1449      Assessment   Diagnosis left shoulder pain  Precautions   Precautions None                  OT Treatments/Exercises (OP) - 06/21/17 1449      Exercises   Exercises Shoulder     Shoulder Exercises: Supine   Protraction PROM;5 reps;AROM;12 reps   Horizontal ABduction PROM;5 reps;AROM;12 reps   External Rotation PROM;5 reps;AROM;12 reps   Internal Rotation PROM;5 reps;AROM;12 reps   Flexion PROM;5 reps;AROM;12 reps   ABduction PROM;5 reps;AROM;12 reps     Shoulder Exercises: Standing   Protraction AROM;10 reps   Horizontal ABduction AROM;10 reps   External Rotation AROM;10 reps   Internal Rotation AROM;10 reps   Flexion AROM;10 reps   ABduction AROM;10 reps     Shoulder Exercises: ROM/Strengthening   UBE (Upper Arm Bike) level 2, 2' forwards, 2' backwards   X to V  Arms 10X   Proximal Shoulder Strengthening, Supine 10X each, no rest breaks   Proximal Shoulder Strengthening, Seated 10X each, no rest breaks     Manual Therapy   Manual Therapy Myofascial release   Manual therapy comments manual therapy completed seperately from all other interventions this date of service   Myofascial Release Myofascial release and manual stretching completed to left upper arm, trapezius and scapularis region to decrease fascial restrictions and increase ROM in a pain-free zone.                 OT Education - 06/21/17 1505    Education provided Yes   Education Details Provided pt with handout for HEP (shoulder A/ROM standing)   Person(s) Educated Patient   Methods Explanation;Demonstration;Verbal cues;Handout   Comprehension Verbalized understanding;Returned demonstration          OT Short Term Goals - 06/12/17 1528      OT SHORT TERM GOAL #1   Title Pt will be educated on HEP to improve mobility required for LUE use during daily tasks.    Time 3   Period Weeks   Status On-going     OT SHORT TERM GOAL #2   Title Pt will decrease pain in LUE to 7/10 or less to improve ability to use LUE during B/IADL task completion.    Time 3   Period Weeks   Status On-going     OT SHORT TERM GOAL #3   Title Pt will improve P/ROM in LUE to [facility] to improve ability to don and doff shirt and other clothing articles.   Time 3   Period Weeks   Status On-going     OT SHORT TERM GOAL #4   Title Pt will decrease pain in RUE to 3/10 or less to improve ability to complete daily tasks using RUE as dominant.    Time 6   Period Weeks   Status On-going     OT SHORT TERM GOAL #5   Title Pt will increase A/ROM to [facility] to improve ability to reaching up and into cabinets.    Time 6   Period Weeks   Status On-going           OT Long Term Goals - 06/12/17 1527      OT LONG TERM GOAL #1   Title Pt will decrease LUE fascial restrictions from moderate to minimal  amounts to improve mobility required for overhead reaching tasks.    Time 6   Period Weeks   Status On-going     OT LONG TERM GOAL #2   Title Pt will improve A/ROM in  LUE to [facility] to improve ability to reach behind back and fasten/unfasten bra.    Time 6   Period Weeks   Status On-going     OT LONG TERM GOAL #3   Title Pt will decrease pain in LUE to 4/10 or less to improve ability to use LUE during B/IADL task completion.    Time 6   Period Weeks   Status On-going     OT LONG TERM GOAL #4   Title Pt will improve LUE strength to 4/5 or more to increase ability to use LUE during heavy work tasks such as lifting pots/pans during cooking.   Time 6   Period Weeks   Status On-going     OT LONG TERM GOAL #5   Title Pt will return to highest level of functioning using LUE to assist in ADL completion for tasks such as wahing hair and clipping her bra.   Time 6   Period Weeks   Status On-going               Plan - 06/21/17 1512    Clinical Impression Statement A: Pt shoed significant increases with A/ROM during today's session. Completed standing A/ROM shoulder exercises. VC needed for form and technique during X to V arms and proximal shoulder strengthening. Added the arm bike.   Plan P: Complete scapular theraband exercises. Attempt 1# weight for supine shoulder strengthening.   OT Home Exercise Plan 05/27/17: Shoulder stretches; 06/21/17: A/ROM shoulder exercises      Patient will benefit from skilled therapeutic intervention in order to improve the following deficits and impairments:  Decreased strength, Impaired flexibility, Decreased mobility, Decreased range of motion, Pain, Increased fascial restricitons, Impaired UE functional use  Visit Diagnosis: Chronic left shoulder pain  Other symptoms and signs involving the musculoskeletal system  Stiffness of left shoulder, not elsewhere classified    Problem List Patient Active Problem List   Diagnosis Date Noted  .  Shoulder pain, left 04/02/2017  . Postop check 12/02/2015  . Pelvic peritoneal adhesions, female,postsurgical 11/22/2015  . Chronic pelvic pain in female 11/22/2015  . Tubo-ovarian adhesions postsurgical 11/22/2015  . Postoperative female pelvic peritoneal adhesions 11/22/2015  . Para-ovarian adhesion left 11/17/2015  . Globus sensation 05/18/2015  . Dysphagia, pharyngoesophageal phase 03/29/2015  . Perimenopausal vasomotor symptoms 09/16/2014  . Torticollis 09/16/2013  . Migraine without aura 09/16/2013  . Torticollis, spasmodic 05/26/2013  . Rotator cuff syndrome of right shoulder 05/26/2013  . Pain in joint, upper arm 05/26/2013  . Dyspepsia 03/17/2013  . Dysthymia 01/30/2013  . Pain syndrome, chronic 01/30/2013  . Arthritis of knee 06/21/2011  . Pain in left knee 06/21/2011  . IBS (irritable bowel syndrome) 03/29/2011  . FATIGUE 12/12/2010  . DYSPNEA ON EXERTION 12/12/2010  . GERD 10/21/2008  . Palpitations 10/21/2008  . MURMUR 10/21/2008  . SUBLUXATION PATELLAR (MALALIGNMENT) 04/01/2008  . KNEE PAIN 01/05/2008  . BACK PAIN 01/05/2008    Luther Hearing, OT Student [phone] 06/21/2017, 4:37 PM  [place] [address] [place], [place], [address] Phone: [phone]   Fax:  [phone]  Name: [name] MRN: [id number] Date of Birth: December 24, 1965     This qualified practitioner was present in the room guiding the student in service delivery. Therapy student was participating in the provision of services, and the practitioner was not engaged in treating another patient or doing other tasks at the same time.  Ailene Ravel, OTR/L,CBIS  [phone]   OCCUPATIONAL THERAPY  DISCHARGE SUMMARY  Visits from Start of Care: 3  Current functional level related to goals / functional outcomes: Unknown. Pt has not returned since last visit on 06/21/17.    Remaining deficits: Unknown    Plan: Patient agrees to  discharge.  Patient goals were not met. Patient is being discharged due to not returning since the last visit.  ?????

## 2017-06-21 NOTE — Telephone Encounter (Signed)
06/21/17  has to take her granddaughter to the dentist

## 2017-06-21 NOTE — Patient Instructions (Signed)
Repeat all exercises 10-15 times, 1-2 times per day.  1) Shoulder Protraction    Begin with elbows by your side, slowly "punch" straight out in front of you keeping arms/elbows straight.      2) Shoulder Flexion  Standing:         Begin with arms at your side with thumbs pointed up, slowly raise both arms up and forward towards overhead.        3) Horizontal abduction/adduction   Standing:           Begin with arms straight out in front of you, bring out to the side in at "T" shape. Keep arms straight entire time.         4) Internal & External Rotation    *No band* -Stand with elbows at the side and elbows bent 90 degrees. Move your forearms away from your body, then bring back inward toward the body.     5) Shoulder Abduction   Standing:       Lying on your back begin with your arms flat on the table next to your side. Slowly move your arms out to the side so that they go overhead, in a jumping jack or snow angel movement.        

## 2017-06-27 ENCOUNTER — Ambulatory Visit (HOSPITAL_COMMUNITY): Payer: Medicare Other | Admitting: Occupational Therapy

## 2017-07-02 ENCOUNTER — Ambulatory Visit (HOSPITAL_COMMUNITY): Payer: Medicare Other | Admitting: Occupational Therapy

## 2017-07-02 ENCOUNTER — Telehealth (HOSPITAL_COMMUNITY): Payer: Self-pay | Admitting: Occupational Therapy

## 2017-07-02 NOTE — Telephone Encounter (Signed)
Called pt regarding no-show. Left message for pt to call and let us know if she wants to schedule additional appointments or discharge.    Ezra SitesLeslie Ellionna Buckbee, OTR/L  [phone] 07/02/2017

## 2017-07-14 ENCOUNTER — Emergency Department (HOSPITAL_COMMUNITY)
Admission: EM | Admit: 2017-07-14 | Discharge: 2017-07-14 | Disposition: A | Discharge status: Home / Self Care | Payer: Medicare Other | Attending: Emergency Medicine | Admitting: Emergency Medicine

## 2017-07-14 ENCOUNTER — Encounter (HOSPITAL_COMMUNITY): Payer: Self-pay | Admitting: *Deleted

## 2017-07-14 DIAGNOSIS — Z79899 Other long term (current) drug therapy: Secondary | ICD-10-CM | POA: Insufficient documentation

## 2017-07-14 DIAGNOSIS — Z7984 Long term (current) use of oral hypoglycemic drugs: Secondary | ICD-10-CM | POA: Insufficient documentation

## 2017-07-14 DIAGNOSIS — E119 Type 2 diabetes mellitus without complications: Secondary | ICD-10-CM | POA: Diagnosis not present

## 2017-07-14 DIAGNOSIS — Z7982 Long term (current) use of aspirin: Secondary | ICD-10-CM | POA: Insufficient documentation

## 2017-07-14 DIAGNOSIS — Z87891 Personal history of nicotine dependence: Secondary | ICD-10-CM | POA: Diagnosis not present

## 2017-07-14 DIAGNOSIS — M5417 Radiculopathy, lumbosacral region: Secondary | ICD-10-CM

## 2017-07-14 DIAGNOSIS — I10 Essential (primary) hypertension: Secondary | ICD-10-CM | POA: Insufficient documentation

## 2017-07-14 DIAGNOSIS — Z791 Long term (current) use of non-steroidal anti-inflammatories (NSAID): Secondary | ICD-10-CM | POA: Diagnosis not present

## 2017-07-14 DIAGNOSIS — M545 Low back pain: Secondary | ICD-10-CM | POA: Diagnosis present

## 2017-07-14 LAB — CBG MONITORING, ED: Glucose-Capillary: 165 mg/dL — ABNORMAL HIGH (ref 65–99)

## 2017-07-14 MED ORDER — DEXAMETHASONE SODIUM PHOSPHATE 10 MG/ML IJ SOLN
5.0000 mg | Freq: Once | INTRAMUSCULAR | Status: AC
  Administered 2017-07-14: 5 mg via INTRAVENOUS
  Filled 2017-07-14: qty 1

## 2017-07-14 MED ORDER — HYDROMORPHONE HCL 1 MG/ML IJ SOLN
0.5000 mg | Freq: Once | INTRAMUSCULAR | Status: AC
  Administered 2017-07-14: 0.5 mg via INTRAVENOUS
  Filled 2017-07-14: qty 1

## 2017-07-14 MED ORDER — HYDROMORPHONE HCL 1 MG/ML IJ SOLN
1.0000 mg | Freq: Once | INTRAMUSCULAR | Status: AC
  Administered 2017-07-14: 1 mg via INTRAVENOUS
  Filled 2017-07-14: qty 1

## 2017-07-14 MED ORDER — ONDANSETRON HCL 4 MG/2ML IJ SOLN
4.0000 mg | Freq: Once | INTRAMUSCULAR | Status: AC
  Administered 2017-07-14: 4 mg via INTRAVENOUS
  Filled 2017-07-14: qty 2

## 2017-07-14 NOTE — Discharge Instructions (Addendum)

## 2017-07-14 NOTE — ED Provider Notes (Signed)
[facility] Provider Note   CSN: [id number] Arrival date & time: 07/14/17  0522     History   Chief Complaint Chief Complaint  Patient presents with  . Back Pain    HPI [name] is a 51 y.o. female.  The history is provided by the patient.  Back Pain   This is a new problem. The current episode started more than 2 days ago. The problem occurs daily. The problem has been gradually worsening. The pain is associated with no known injury. The quality of the pain is described as shooting. The pain radiates to the right thigh. The pain is severe. The symptoms are aggravated by certain positions and bending. The pain is the same all the time. Associated symptoms include numbness. Pertinent negatives include no chest pain, no fever, no abdominal pain, no bowel incontinence, no bladder incontinence, no dysuria and no weakness. She has tried bed rest for the symptoms. The treatment provided no relief.   Patient with h/o chronic pain, h/o previous discectomy, h/o diabetes presents with low back/right hip pain for past 6 days She has long h/o back pain, requiring discectomy several yrs ago for left leg weakness. However 6 days ago the back pain worsened and is now radiating into right hip/thigh She reports numbness in left foot only No fever/vomiting No trauma/fall  Past Medical History:  Diagnosis Date  . BMI (body mass index) 20.0-29.9 2008 192 lbs  . Complication of anesthesia    post op N/V  . Depression   . Diabetes mellitus   . GERD (gastroesophageal reflux disease) 2002   202 lbs  . Hypertension   . Irritable bowel syndrome 2008   diarrhea predominant MEDS TRIED: BENTYL, LEVBID  . LBP (low back pain)   . PONV (postoperative nausea and vomiting)   . PTSD (post-traumatic stress disorder)   . RUQ abdominal pain 2004 HIDA NL CTA w/ IVC-FATTY LIVER, ANGIOMYOLIPOMA LEFT KIDNEY   2008 HIDA NL    Patient Active Problem List   Diagnosis Date Noted  . Shoulder pain,  left 04/02/2017  . Postop check 12/02/2015  . Pelvic peritoneal adhesions, female,postsurgical 11/22/2015  . Chronic pelvic pain in female 11/22/2015  . Tubo-ovarian adhesions postsurgical 11/22/2015  . Postoperative female pelvic peritoneal adhesions 11/22/2015  . Para-ovarian adhesion left 11/17/2015  . Globus sensation 05/18/2015  . Dysphagia, pharyngoesophageal phase 03/29/2015  . Perimenopausal vasomotor symptoms 09/16/2014  . Torticollis 09/16/2013  . Migraine without aura 09/16/2013  . Torticollis, spasmodic 05/26/2013  . Rotator cuff syndrome of right shoulder 05/26/2013  . Pain in joint, upper arm 05/26/2013  . Dyspepsia 03/17/2013  . Dysthymia 01/30/2013  . Pain syndrome, chronic 01/30/2013  . Arthritis of knee 06/21/2011  . Pain in left knee 06/21/2011  . IBS (irritable bowel syndrome) 03/29/2011  . FATIGUE 12/12/2010  . DYSPNEA ON EXERTION 12/12/2010  . GERD 10/21/2008  . Palpitations 10/21/2008  . MURMUR 10/21/2008  . SUBLUXATION PATELLAR (MALALIGNMENT) 04/01/2008  . KNEE PAIN 01/05/2008  . BACK PAIN 01/05/2008    Past Surgical History:  Procedure Laterality Date  . ANKLE SURGERY  RIGHT  . BREAST REDUCTION SURGERY  2006  . COLONOSCOPY  2008 RMR   PAN-COLONIC TICS, NL TI  . COLONOSCOPY  2013   Dr. Teena Dunk: diverticulum in ascending colon, mild diverticulosis in sigmoid. Recommended 5 year surveillance  . ESOPHAGEAL DILATION N/A 04/06/2015   Procedure: ESOPHAGEAL DILATION;  Surgeon: Corbin Ade, MD;  Location: [facility] END[facility];  Service: Gastroenterology;  Laterality: N/A;  300  . ESOPHAGOGASTRODUODENOSCOPY N/A 04/06/2015   RMR: Normal EGD status post Maloney dilation of teh esophagus as described above  . LAPAROSCOPIC BILATERAL SALPINGO OOPHERECTOMY Bilateral 11/22/2015   Procedure: LAPAROSCOPIC BILATERAL SALPINGO OOPHORECTOMY;  Surgeon: Tilda Burrow, MD;  Location: [facility] ORS;  Service: Gynecology;  Laterality: Bilateral;  . LUMBAR DISC SURGERY  2005  . SHOULDER  SURGERY  1997 RIGHT  . TOTAL ABDOMINAL HYSTERECTOMY  2000 fibroids/DUB  . UPPER GASTROINTESTINAL ENDOSCOPY  2008 RMR   SML HH o/w NL  . vocal cord polyp removal  2013   Dr. Josephina Shih    OB History    No data available       Home Medications    Prior to Admission medications   Medication Sig Start Date End Date Taking? Authorizing Provider  aspirin 81 MG tablet Take 81 mg by mouth every morning.     [provider]  atorvastatin (LIPITOR) 10 MG tablet Take 1 tablet by mouth daily. 09/09/13   [provider]  buPROPion (WELLBUTRIN XL) 150 MG 24 hr tablet Take 2 tablets (300 mg total) by mouth every morning. 02/04/17 02/04/18  Myrlene Broker, MD  Butalbital-APAP-Caffeine (FIORICET PO) Take by mouth. 1 tablet every 8 hour prn headache    [provider]  Cholecalciferol (VITAMIN D PO) Take 5,000 Units by mouth daily.    [provider]  COMBIVENT RESPIMAT 20-100 MCG/ACT AERS respimat  04/24/16   [provider]  Cyanocobalamin (VITAMIN B-12 PO) Take by mouth daily.    [provider]  DEXILANT 60 MG capsule TAKE 1 CAPSULE BY MOUTH ONCE A DAY. 03/22/17   Tiffany Kocher, PA-C  DULoxetine (CYMBALTA) 60 MG capsule Take 1 capsule (60 mg total) by mouth daily. 02/04/17 02/04/18  Myrlene Broker, MD  fexofenadine (ALLEGRA) 180 MG tablet Take 180 mg by mouth every morning.     [provider]  fluconazole (DIFLUCAN) 100 MG tablet Take 1 tablet (100 mg total) by mouth daily. 03/16/16   Lazaro Arms, MD  furosemide (LASIX) 20 MG tablet Take 20 mg by mouth 2 (two) times daily.      [provider]  HYDROcodone-acetaminophen (NORCO/VICODIN) 5-325 MG tablet One tablet by mouth every six hours as needed for pain.  Seven day limit per Medicaid guidelines. 05/07/17   Darreld Mclean, MD  lidocaine (LIDODERM) 5 % Place 1 patch onto the skin every 12 (twelve) hours as needed (for pain). Reported on 03/16/2016 08/24/13   [provider]  Liraglutide (VICTOZA) 18 MG/3ML SOPN Inject 1.8 mg into the skin every evening.     [provider]  LYRICA 75 MG capsule Take 75 mg by mouth 2 (two) times daily.  03/10/13   [provider]  meloxicam (MOBIC) 15 MG tablet 15 mg. 06/26/16   [provider]  metFORMIN (GLUCOPHAGE-XR) 500 MG 24 hr tablet Take by mouth. Taking 4 Tablets Daily 11/02/14   [provider]  metoprolol (LOPRESSOR) 100 MG tablet Take 100 mg by mouth 2 (two) times daily.  03/23/11   [provider]  Multiple Vitamin (MULTIVITAMIN) capsule Take 1 capsule by mouth daily.      [provider]  nystatin-triamcinolone ointment (MYCOLOG) Apply 1 application topically 2 (two) times daily. 03/16/16   Lazaro Arms, MD  pantoprazole (PROTONIX) 40 MG tablet TAKE (1) TABLET BY MOUTH ONCE DAILY. 12/03/14   Anice Paganini, NP  Probiotic Product [facility] [place] COLON HEALTH  PO) Take 1 capsule by mouth daily.    [provider]  valsartan-hydrochlorothiazide (DIOVAN-HCT) 160-25 MG per tablet Take 1 tablet by mouth daily. 11/02/14   [provider]  VOLTAREN 1 % GEL Apply 2 g topically daily as needed (pain).  10/28/14   [provider]  zolpidem (AMBIEN CR) 12.5 MG CR tablet Take 1 tablet (12.5 mg total) by mouth at bedtime as needed for sleep. 07/12/16 07/12/17  Myrlene Broker, MD    Family History Family History  Problem Relation Age of Onset  . Breast cancer Maternal Aunt   . Anxiety disorder Maternal Aunt   . Anxiety disorder Mother   . Stroke Mother   . COPD Mother   . Hypertension Mother   . Thyroid disease Mother   . Diabetes Mother   . Drug abuse Daughter   . Anxiety disorder Daughter   . Seizures Sister   . Alcohol abuse Maternal Uncle   . Mental illness Cousin   . Dementia Maternal Aunt   . Anxiety disorder Sister   . Stroke Sister   . Anxiety disorder Sister   . Drug abuse Sister   . Early death Sister   . Heart disease Sister   . Heart  attack Sister   . Stroke Maternal Grandmother   . Colon polyps Neg Hx   . Colon cancer Neg Hx   . ADD / ADHD Neg Hx   . Bipolar disorder Neg Hx   . Depression Neg Hx   . OCD Neg Hx   . Paranoid behavior Neg Hx   . Schizophrenia Neg Hx   . Sexual abuse Neg Hx   . Physical abuse Neg Hx     Social History Social History  Substance Use Topics  . Smoking status: Former Smoker    Packs/day: 0.50    Years: 2.00    Types: Cigarettes    Quit date: 05/11/2015  . Smokeless tobacco: Never Used  . Alcohol use No     Allergies   Neurontin [gabapentin]   Review of Systems Review of Systems  Constitutional: Negative for fever.  Cardiovascular: Negative for chest pain.  Gastrointestinal: Negative for abdominal pain and bowel incontinence.  Genitourinary: Negative for bladder incontinence, difficulty urinating and dysuria.  Musculoskeletal: Positive for back pain.  Skin: Negative for rash.  Neurological: Positive for numbness. Negative for weakness.  All other systems reviewed and are negative.    Physical Exam Updated Vital Signs BP (!) 143/103 (BP Location: Left Arm)   Pulse (!) 102   Temp 99.3 F (37.4 C) (Oral)   Resp 18   Ht 1.803 m (5\' 11" )   Wt 93.9 kg (207 lb)   SpO2 96%   BMI 28.87 kg/m   Physical Exam CONSTITUTIONAL: Well developed/well nourished HEAD: Normocephalic/atraumatic EYES: EOMI/PERRL ENMT: Mucous membranes moist NECK: supple no meningeal signs SPINE/BACK:entire spine nontender, well healed midline lumbar scar CV: S1/S2 noted, no murmurs/rubs/gallops noted LUNGS: Lungs are clear to auscultation bilaterally, no apparent distress ABDOMEN: soft, nontender GU:no cva tenderness NEURO: Awake/alert, equal motor 5/5 strength noted with the following: hip flexion/knee flexion/extension, foot dorsi/plantar flexion, great toe extension intact bilaterally, no clonus bilaterally, plantar reflex appropriate (toes downgoing), no sensory deficit in any dermatome.     EXTREMITIES: pulses normal, full ROM, both feet are warm and not discolored.  Equal distal pulses.  No deformities to lower extremities SKIN: warm, color normal PSYCH: no abnormalities of mood noted, alert and oriented to situation  ED Treatments / Results  Labs (all labs ordered are listed, but only abnormal results are displayed) Labs Reviewed  CBG MONITORING, ED - Abnormal; Notable for the following:       Result Value   Glucose-Capillary 165 (*)    All other components within normal limits    EKG  EKG Interpretation None       Radiology No results found.  Procedures Procedures   Medications Ordered in ED Medications  HYDROmorphone (DILAUDID) injection 0.5 mg (not administered)  HYDROmorphone (DILAUDID) injection 1 mg (1 mg Intravenous Given 07/14/17 0613)  ondansetron (ZOFRAN) injection 4 mg (4 mg Intravenous Given 07/14/17 0613)  dexamethasone (DECADRON) injection 5 mg (5 mg Intravenous Given 07/14/17 0615)     Initial Impression / Assessment and Plan / ED Course  I have reviewed the triage vital signs and the nursing notes.  Pertinent labs   results that were available during my care of the patient were reviewed by me and considered in my medical decision making (see chart for details).     5:50 AM Previous MRI reveals disc protrusion at L4 Suspect she has had worsening protrusion with nerve impingement No gross motor deficits Plan for pain control and reassess She has f/u this week with pain specialist - she does not feel this will violate her pain contract 6:52 AM Pt improved She can now stand/walk   She feels comfortable with d/c home She has f/u tomorrow with pain specialist She reports she is already having plans for MRI of back   We discussed strict ER return precautions  BP (!) 155/106 (BP Location: Left Arm)   Pulse 89   Temp 98.5 F (36.9 C) (Oral)   Resp 20   Ht 1.803 m (5\' 11" )   Wt 93.9 kg (207 lb)   SpO2 98%   BMI 28.87 kg/m      Final Clinical Impressions(s) / ED Diagnoses   Final diagnoses:  Lumbosacral radiculopathy    New Prescriptions Current Discharge Medication List       Zadie Rhine, MD 07/14/17 [phone]

## 2017-07-14 NOTE — ED Triage Notes (Signed)
Pt c/o lower back pain that radiates down legs with numbness to leg as well, denies any injury, states that she has DDD,

## 2017-07-18 ENCOUNTER — Ambulatory Visit (INDEPENDENT_AMBULATORY_CARE_PROVIDER_SITE_OTHER): Payer: Self-pay | Admitting: Otolaryngology

## 2017-07-24 ENCOUNTER — Encounter (HOSPITAL_COMMUNITY): Payer: Self-pay | Admitting: Emergency Medicine

## 2017-07-24 ENCOUNTER — Emergency Department (HOSPITAL_COMMUNITY)
Admission: EM | Admit: 2017-07-24 | Discharge: 2017-07-24 | Disposition: A | Discharge status: Home / Self Care | Payer: Medicare Other | Attending: Emergency Medicine | Admitting: Emergency Medicine

## 2017-07-24 DIAGNOSIS — Z7984 Long term (current) use of oral hypoglycemic drugs: Secondary | ICD-10-CM | POA: Insufficient documentation

## 2017-07-24 DIAGNOSIS — Z791 Long term (current) use of non-steroidal anti-inflammatories (NSAID): Secondary | ICD-10-CM | POA: Diagnosis not present

## 2017-07-24 DIAGNOSIS — M5431 Sciatica, right side: Secondary | ICD-10-CM

## 2017-07-24 DIAGNOSIS — Z79899 Other long term (current) drug therapy: Secondary | ICD-10-CM | POA: Insufficient documentation

## 2017-07-24 DIAGNOSIS — Z7982 Long term (current) use of aspirin: Secondary | ICD-10-CM | POA: Insufficient documentation

## 2017-07-24 DIAGNOSIS — Z87891 Personal history of nicotine dependence: Secondary | ICD-10-CM | POA: Insufficient documentation

## 2017-07-24 DIAGNOSIS — E119 Type 2 diabetes mellitus without complications: Secondary | ICD-10-CM | POA: Insufficient documentation

## 2017-07-24 DIAGNOSIS — M545 Low back pain: Secondary | ICD-10-CM | POA: Diagnosis present

## 2017-07-24 DIAGNOSIS — I10 Essential (primary) hypertension: Secondary | ICD-10-CM | POA: Diagnosis not present

## 2017-07-24 MED ORDER — HYDROMORPHONE HCL 1 MG/ML IJ SOLN
1.0000 mg | Freq: Once | INTRAMUSCULAR | Status: AC
  Administered 2017-07-24: 1 mg via INTRAVENOUS
  Filled 2017-07-24: qty 1

## 2017-07-24 MED ORDER — PREDNISONE 20 MG PO TABS: ORAL_TABLET | ORAL | 0 refills | Status: DC

## 2017-07-24 MED ORDER — METOCLOPRAMIDE HCL 5 MG/ML IJ SOLN
10.0000 mg | Freq: Once | INTRAMUSCULAR | Status: AC
  Administered 2017-07-24: 10 mg via INTRAVENOUS
  Filled 2017-07-24: qty 2

## 2017-07-24 MED ORDER — DEXAMETHASONE SODIUM PHOSPHATE 10 MG/ML IJ SOLN
10.0000 mg | Freq: Once | INTRAMUSCULAR | Status: AC
  Administered 2017-07-24: 10 mg via INTRAVENOUS
  Filled 2017-07-24: qty 1

## 2017-07-24 MED ORDER — METHOCARBAMOL 500 MG PO TABS: 500.0000 mg | ORAL_TABLET | Freq: Two times a day (BID) | ORAL | 0 refills | Status: DC

## 2017-07-24 MED ORDER — KETOROLAC TROMETHAMINE 30 MG/ML IJ SOLN
30.0000 mg | Freq: Once | INTRAMUSCULAR | Status: AC
  Administered 2017-07-24: 30 mg via INTRAVENOUS
  Filled 2017-07-24: qty 1

## 2017-07-24 NOTE — ED Notes (Signed)
Pt states some pain for the last month, but has gotten really bad yesterday. Pt states pain in lower back that running down her right leg. Pt denies any recent injury, exercise, or new activity.

## 2017-07-24 NOTE — ED Provider Notes (Signed)
[facility]PT Provider Note   CSN: [id number] Arrival date & time: 07/24/17  0026     History   Chief Complaint Chief Complaint  Patient presents with  . Back Pain    HPI [name] is a 51 y.o. female.  Patient presents with complaints of low back pain. Patient reports the symptoms have been ongoing for a month. Patient reports pain in the right posterior hip area that radiates all the way down her leg. Pain worsens with movement. She does have a history of degenerative disc disease and bulging discs, seen on MRI in 2016. She has not noticed any weakness in the leg, bowel or bladder changes.      Past Medical History:  Diagnosis Date  . BMI (body mass index) 20.0-29.9 2008 192 lbs  . Complication of anesthesia    post op N/V  . Depression   . Diabetes mellitus   . GERD (gastroesophageal reflux disease) 2002   202 lbs  . Hypertension   . Irritable bowel syndrome 2008   diarrhea predominant MEDS TRIED: BENTYL, LEVBID  . LBP (low back pain)   . PONV (postoperative nausea and vomiting)   . PTSD (post-traumatic stress disorder)   . RUQ abdominal pain 2004 HIDA NL CTA w/ IVC-FATTY LIVER, ANGIOMYOLIPOMA LEFT KIDNEY   2008 HIDA NL    Patient Active Problem List   Diagnosis Date Noted  . Shoulder pain, left 04/02/2017  . Postop check 12/02/2015  . Pelvic peritoneal adhesions, female,postsurgical 11/22/2015  . Chronic pelvic pain in female 11/22/2015  . Tubo-ovarian adhesions postsurgical 11/22/2015  . Postoperative female pelvic peritoneal adhesions 11/22/2015  . Para-ovarian adhesion left 11/17/2015  . Globus sensation 05/18/2015  . Dysphagia, pharyngoesophageal phase 03/29/2015  . Perimenopausal vasomotor symptoms 09/16/2014  . Torticollis 09/16/2013  . Migraine without aura 09/16/2013  . Torticollis, spasmodic 05/26/2013  . Rotator cuff syndrome of right shoulder 05/26/2013  . Pain in joint, upper arm 05/26/2013  . Dyspepsia 03/17/2013  . Dysthymia  01/30/2013  . Pain syndrome, chronic 01/30/2013  . Arthritis of knee 06/21/2011  . Pain in left knee 06/21/2011  . IBS (irritable bowel syndrome) 03/29/2011  . FATIGUE 12/12/2010  . DYSPNEA ON EXERTION 12/12/2010  . GERD 10/21/2008  . Palpitations 10/21/2008  . MURMUR 10/21/2008  . SUBLUXATION PATELLAR (MALALIGNMENT) 04/01/2008  . KNEE PAIN 01/05/2008  . BACK PAIN 01/05/2008    Past Surgical History:  Procedure Laterality Date  . ANKLE SURGERY  RIGHT  . BREAST REDUCTION SURGERY  2006  . COLONOSCOPY  2008 RMR   PAN-COLONIC TICS, NL TI  . COLONOSCOPY  2013   Dr. Teena Dunk: diverticulum in ascending colon, mild diverticulosis in sigmoid. Recommended 5 year surveillance  . ESOPHAGEAL DILATION N/A 04/06/2015   Procedure: ESOPHAGEAL DILATION;  Surgeon: Corbin Ade, MD;  Location: [facility];  Service: Gastroenterology;  Laterality: N/A;  300  . ESOPHAGOGASTRODUODENOSCOPY N/A 04/06/2015   RMR: Normal EGD status post Maloney dilation of teh esophagus as described above  . LAPAROSCOPIC BILATERAL SALPINGO OOPHERECTOMY Bilateral 11/22/2015   Procedure: LAPAROSCOPIC BILATERAL SALPINGO OOPHORECTOMY;  Surgeon: Tilda Burrow, MD;  Location: [facility] ORS;  Service: Gynecology;  Laterality: Bilateral;  . LUMBAR DISC SURGERY  2005  . SHOULDER SURGERY  1997 RIGHT  . TOTAL ABDOMINAL HYSTERECTOMY  2000 fibroids/DUB  . UPPER GASTROINTESTINAL ENDOSCOPY  2008 RMR   [facility] o/w NL  . vocal cord polyp removal  2013   Dr. Josephina Shih    OB History  No data available       Home Medications    Prior to Admission medications   Medication Sig Start Date End Date Taking? Authorizing Provider  aspirin 81 MG tablet Take 81 mg by mouth every morning.     [provider]  atorvastatin (LIPITOR) 10 MG tablet Take 1 tablet by mouth daily. 09/09/13   [provider]  buPROPion (WELLBUTRIN XL) 150 MG 24 hr tablet Take 2 tablets (300 mg total) by mouth every morning. 02/04/17 02/04/18  Myrlene Broker, MD  Butalbital-APAP-Caffeine (FIORICET PO) Take by mouth. 1 tablet every 8 hour prn headache    [provider]  Cholecalciferol (VITAMIN D PO) Take 5,000 Units by mouth daily.    [provider]  COMBIVENT RESPIMAT 20-100 MCG/ACT AERS respimat  04/24/16   [provider]  Cyanocobalamin (VITAMIN B-12 PO) Take by mouth daily.    [provider]  DEXILANT 60 MG capsule TAKE 1 CAPSULE BY MOUTH ONCE A DAY. 03/22/17   Tiffany Kocher, PA-C  DULoxetine (CYMBALTA) 60 MG capsule Take 1 capsule (60 mg total) by mouth daily. 02/04/17 02/04/18  Myrlene Broker, MD  fexofenadine (ALLEGRA) 180 MG tablet Take 180 mg by mouth every morning.     [provider]  fluconazole (DIFLUCAN) 100 MG tablet Take 1 tablet (100 mg total) by mouth daily. 03/16/16   Lazaro Arms, MD  furosemide (LASIX) 20 MG tablet Take 20 mg by mouth 2 (two) times daily.      [provider]  HYDROcodone-acetaminophen (NORCO/VICODIN) 5-325 MG tablet One tablet by mouth every six hours as needed for pain.  Seven day limit per Medicaid guidelines. 05/07/17   Darreld Mclean, MD  lidocaine (LIDODERM) 5 % Place 1 patch onto the skin every 12 (twelve) hours as needed (for pain). Reported on 03/16/2016 08/24/13   [provider]  Liraglutide (VICTOZA) 18 MG/3ML SOPN Inject 1.8 mg into the skin every evening.     [provider]  LYRICA 75 MG capsule Take 75 mg by mouth 2 (two) times daily.  03/10/13   [provider]  meloxicam (MOBIC) 15 MG tablet 15 mg. 06/26/16   [provider]  metFORMIN (GLUCOPHAGE-XR) 500 MG 24 hr tablet Take by mouth. Taking 4 Tablets Daily 11/02/14   [provider]  metoprolol (LOPRESSOR) 100 MG tablet Take 100 mg by mouth 2 (two) times daily.  03/23/11   [provider]  Multiple Vitamin (MULTIVITAMIN) capsule Take 1 capsule by mouth daily.      [provider]  nystatin-triamcinolone ointment (MYCOLOG)  Apply 1 application topically 2 (two) times daily. 03/16/16   Lazaro Arms, MD  pantoprazole (PROTONIX) 40 MG tablet TAKE (1) TABLET BY MOUTH ONCE DAILY. 12/03/14   Anice Paganini, NP  Probiotic Product (PHILLIPS COLON HEALTH PO) Take 1 capsule by mouth daily.    [provider]  valsartan-hydrochlorothiazide (DIOVAN-HCT) 160-25 MG per tablet Take 1 tablet by mouth daily. 11/02/14   [provider]  VOLTAREN 1 % GEL Apply 2 g topically daily as needed (pain).  10/28/14   [provider]  zolpidem (AMBIEN CR) 12.5 MG CR tablet Take 1 tablet (12.5 mg total) by mouth at bedtime as needed for sleep. 07/12/16 07/12/17  Myrlene Broker, MD    Family History Family History  Problem Relation Age of Onset  . Breast cancer Maternal Aunt   . Anxiety disorder Maternal Aunt   . Anxiety disorder  Mother   . Stroke Mother   . COPD Mother   . Hypertension Mother   . Thyroid disease Mother   . Diabetes Mother   . Drug abuse Daughter   . Anxiety disorder Daughter   . Seizures Sister   . Alcohol abuse Maternal Uncle   . Mental illness Cousin   . Dementia Maternal Aunt   . Anxiety disorder Sister   . Stroke Sister   . Anxiety disorder Sister   . Drug abuse Sister   . Early death Sister   . Heart disease Sister   . Heart attack Sister   . Stroke Maternal Grandmother   . Colon polyps Neg Hx   . Colon cancer Neg Hx   . ADD / ADHD Neg Hx   . Bipolar disorder Neg Hx   . Depression Neg Hx   . OCD Neg Hx   . Paranoid behavior Neg Hx   . Schizophrenia Neg Hx   . Sexual abuse Neg Hx   . Physical abuse Neg Hx     Social History Social History  Substance Use Topics  . Smoking status: Former Smoker    Packs/day: 0.50    Years: 2.00    Types: Cigarettes    Quit date: 05/11/2015  . Smokeless tobacco: Never Used  . Alcohol use No     Allergies   Neurontin [gabapentin]   Review of Systems Review of Systems  Musculoskeletal: Positive for back pain.  All other systems  reviewed and are negative.    Physical Exam Updated Vital Signs BP (!) 174/95   Pulse (!) 111   Temp 99.7 F (37.6 C) (Oral)   Resp 20   Ht 5\' 11"  (1.803 m)   Wt 93.9 kg (207 lb)   SpO2 100%   BMI 28.87 kg/m   Physical Exam  Constitutional: She is oriented to person, place, and time. She appears well-developed and well-nourished. She appears distressed.  HENT:  Head: Normocephalic and atraumatic.  Right Ear: Hearing normal.  Left Ear: Hearing normal.  Nose: Nose normal.  Mouth/Throat: Oropharynx is clear and moist and mucous membranes are normal.  Eyes: Pupils are equal, round, and reactive to light. Conjunctivae and EOM are normal.  Neck: Normal range of motion. Neck supple.  Cardiovascular: Regular rhythm, S1 normal and S2 normal.  Exam reveals no gallop and no friction rub.   No murmur heard. Pulmonary/Chest: Effort normal and breath sounds normal. No respiratory distress. She exhibits no tenderness.  Abdominal: Soft. Normal appearance and bowel sounds are normal. There is no hepatosplenomegaly. There is no tenderness. There is no rebound, no guarding, no tenderness at McBurney's point and negative Murphy's sign. No hernia.  Musculoskeletal: Normal range of motion.  Neurological: She is alert and oriented to person, place, and time. She has normal strength. No cranial nerve deficit or sensory deficit. Coordination normal. GCS eye subscore is 4. GCS verbal subscore is 5. GCS motor subscore is 6.  Skin: Skin is warm, dry and intact. No rash noted. No cyanosis.  Psychiatric: She has a normal mood and affect. Her speech is normal and behavior is normal. Thought content normal.  Nursing note and vitals reviewed.    ED Treatments / Results  Labs (all labs ordered are listed, but only abnormal results are displayed) Labs Reviewed - No data to display  EKG  EKG Interpretation None       Radiology No results found.  Procedures Procedures (including critical care  time)  Medications Ordered  in ED Medications - No data to display   Initial Impression / Assessment and Plan / ED Course  I have reviewed the triage vital signs and the nursing notes.  Pertinent labs & imaging results that were available during my care of the patient were reviewed by me and considered in my medical decision making (see chart for details).     Patient presents to the ER with musculoskeletal back pain. Examination reveals back tenderness without any associated neurologic findings. Patient's strength, sensation and reflexes were normal. There is no evidence of saddle anesthesia. Patient does not have a foot drop. Patient has not experienced any change in bowel or bladder function. As such, patient did not require any imaging or further studies. Patient was treated with analgesia.  Final Clinical Impressions(s) / ED Diagnoses   Final diagnoses:  Sciatica of right side    New Prescriptions New Prescriptions   No medications on file     Gilda CreasePollina, Rune Mendez J, MD 07/24/17 0100

## 2017-07-24 NOTE — ED Notes (Signed)
Pt alert & oriented x4, stable gait. Patient given discharge instructions, paperwork & prescription(s). Registration completed in the room.  Patient verbalized understanding. Pt left department w/ no further questions. 

## 2017-07-24 NOTE — ED Triage Notes (Signed)
Patient c/o low back pain that radiates down right leg-started last night. Denies any known injury or straining of back. Per patient has DDD. Patient does report having some urine incontinency.

## 2017-07-27 ENCOUNTER — Emergency Department (HOSPITAL_COMMUNITY): Payer: Medicare Other

## 2017-07-27 ENCOUNTER — Encounter (HOSPITAL_COMMUNITY): Payer: Self-pay

## 2017-07-27 ENCOUNTER — Emergency Department (HOSPITAL_COMMUNITY)
Admission: EM | Admit: 2017-07-27 | Discharge: 2017-07-27 | Disposition: A | Discharge status: Home / Self Care | Payer: Medicare Other | Attending: Emergency Medicine | Admitting: Emergency Medicine

## 2017-07-27 DIAGNOSIS — M5410 Radiculopathy, site unspecified: Secondary | ICD-10-CM

## 2017-07-27 DIAGNOSIS — Z7982 Long term (current) use of aspirin: Secondary | ICD-10-CM | POA: Diagnosis not present

## 2017-07-27 DIAGNOSIS — I10 Essential (primary) hypertension: Secondary | ICD-10-CM | POA: Insufficient documentation

## 2017-07-27 DIAGNOSIS — M5136 Other intervertebral disc degeneration, lumbar region: Secondary | ICD-10-CM

## 2017-07-27 DIAGNOSIS — N3942 Incontinence without sensory awareness: Secondary | ICD-10-CM | POA: Diagnosis not present

## 2017-07-27 DIAGNOSIS — M5126 Other intervertebral disc displacement, lumbar region: Secondary | ICD-10-CM | POA: Diagnosis not present

## 2017-07-27 DIAGNOSIS — M51369 Other intervertebral disc degeneration, lumbar region without mention of lumbar back pain or lower extremity pain: Secondary | ICD-10-CM

## 2017-07-27 DIAGNOSIS — E876 Hypokalemia: Secondary | ICD-10-CM | POA: Diagnosis not present

## 2017-07-27 DIAGNOSIS — E1165 Type 2 diabetes mellitus with hyperglycemia: Secondary | ICD-10-CM | POA: Diagnosis not present

## 2017-07-27 DIAGNOSIS — Z87891 Personal history of nicotine dependence: Secondary | ICD-10-CM | POA: Insufficient documentation

## 2017-07-27 DIAGNOSIS — Z79899 Other long term (current) drug therapy: Secondary | ICD-10-CM | POA: Diagnosis not present

## 2017-07-27 DIAGNOSIS — M5431 Sciatica, right side: Secondary | ICD-10-CM | POA: Diagnosis not present

## 2017-07-27 DIAGNOSIS — M79604 Pain in right leg: Secondary | ICD-10-CM

## 2017-07-27 DIAGNOSIS — M545 Low back pain: Secondary | ICD-10-CM | POA: Diagnosis present

## 2017-07-27 LAB — BASIC METABOLIC PANEL
Anion gap: 11 (ref 5–15)
BUN: 9 mg/dL (ref 6–20)
CALCIUM: 9.4 mg/dL (ref 8.9–10.3)
CO2: 26 mmol/L (ref 22–32)
Chloride: 97 mmol/L — ABNORMAL LOW (ref 101–111)
Creatinine, Ser: 0.86 mg/dL (ref 0.44–1.00)
Glucose, Bld: 337 mg/dL — ABNORMAL HIGH (ref 65–99)
POTASSIUM: 3.1 mmol/L — AB (ref 3.5–5.1)
SODIUM: 134 mmol/L — AB (ref 135–145)

## 2017-07-27 LAB — URINALYSIS, ROUTINE W REFLEX MICROSCOPIC
Bilirubin Urine: NEGATIVE
Glucose, UA: 50 mg/dL — AB
Hgb urine dipstick: NEGATIVE
Ketones, ur: NEGATIVE mg/dL
LEUKOCYTES UA: NEGATIVE
Nitrite: NEGATIVE
PROTEIN: NEGATIVE mg/dL
Specific Gravity, Urine: 1.012 (ref 1.005–1.030)
pH: 5 (ref 5.0–8.0)

## 2017-07-27 LAB — CBC WITH DIFFERENTIAL/PLATELET
BASOS ABS: 0 10*3/uL (ref 0.0–0.1)
Basophils Relative: 0 %
EOS PCT: 1 %
Eosinophils Absolute: 0.2 10*3/uL (ref 0.0–0.7)
HCT: 41.7 % (ref 36.0–46.0)
Hemoglobin: 14.8 g/dL (ref 12.0–15.0)
Lymphocytes Relative: 27 %
Lymphs Abs: 3.8 10*3/uL (ref 0.7–4.0)
MCH: 30.6 pg (ref 26.0–34.0)
MCHC: 35.5 g/dL (ref 30.0–36.0)
MCV: 86.2 fL (ref 78.0–100.0)
Monocytes Absolute: 0.6 10*3/uL (ref 0.1–1.0)
Monocytes Relative: 5 %
NEUTROS ABS: 9.1 10*3/uL — AB (ref 1.7–7.7)
Neutrophils Relative %: 67 %
PLATELETS: 296 10*3/uL (ref 150–400)
RBC: 4.84 MIL/uL (ref 3.87–5.11)
RDW: 12.8 % (ref 11.5–15.5)
WBC: 13.7 10*3/uL — AB (ref 4.0–10.5)

## 2017-07-27 MED ORDER — OXYCODONE-ACETAMINOPHEN 5-325 MG PO TABS
1.0000 | ORAL_TABLET | Freq: Once | ORAL | Status: AC
  Administered 2017-07-27: 1 via ORAL
  Filled 2017-07-27: qty 1

## 2017-07-27 MED ORDER — IBUPROFEN 400 MG PO TABS
400.0000 mg | ORAL_TABLET | Freq: Once | ORAL | Status: AC
  Administered 2017-07-27: 400 mg via ORAL
  Filled 2017-07-27: qty 1

## 2017-07-27 MED ORDER — HYDROMORPHONE HCL 1 MG/ML IJ SOLN
1.0000 mg | Freq: Once | INTRAMUSCULAR | Status: AC
  Administered 2017-07-27: 1 mg via INTRAVENOUS
  Filled 2017-07-27: qty 1

## 2017-07-27 MED ORDER — HYDROMORPHONE HCL 1 MG/ML IJ SOLN
1.0000 mg | Freq: Once | INTRAMUSCULAR | Status: AC
  Administered 2017-07-27: 1 mg via INTRAMUSCULAR
  Filled 2017-07-27: qty 1

## 2017-07-27 MED ORDER — MORPHINE SULFATE (PF) 4 MG/ML IV SOLN
4.0000 mg | Freq: Once | INTRAVENOUS | Status: AC
  Administered 2017-07-27: 4 mg via INTRAVENOUS
  Filled 2017-07-27: qty 1

## 2017-07-27 MED ORDER — POTASSIUM CHLORIDE CRYS ER 20 MEQ PO TBCR
60.0000 meq | EXTENDED_RELEASE_TABLET | Freq: Once | ORAL | Status: AC
  Administered 2017-07-27: 60 meq via ORAL
  Filled 2017-07-27: qty 3

## 2017-07-27 MED ORDER — LORAZEPAM 2 MG/ML IJ SOLN
1.0000 mg | Freq: Once | INTRAMUSCULAR | Status: AC
  Administered 2017-07-27: 1 mg via INTRAVENOUS
  Filled 2017-07-27: qty 1

## 2017-07-27 NOTE — ED Provider Notes (Signed)
[name] was seen by Dr. Manus Gunningancour there, please see their notes for full documentation of patient's complaint/HPI. Briefly, pt here with gradually worsening sciatica pain x1 month, now with urinary incontinence; was seen 07/24/17 at which time she got toradol 30mg , decadron 10mg , reglan 10mg , and dilaudid 1mg  IV and was sent home with robaxin and prednisone; prior to that she was seen 07/14/17 and had 1mg  dilaudid, 5mg  decadron, then 0.5mg  dilaudid IV before being discharged with oxycodone rx; no testing was done on either of those visits. Today she had post-void residual scan which showed 28mL, and U/A was essentially unremarkable. She has been given 400mg  ibuprofen, 1 percocet 5-325mg  tablet, and 1mg  dilaudid prior to transfer. Due to concern for possible cord compression, pt was transferred here for MRI L-spine.   Physical Exam  BP 120/84   Pulse 87   Temp 98.8 F (37.1 C)   Resp 16   Ht 5\' 11"  (1.803 m)   Wt 93.9 kg (207 lb)   SpO2 97%   BMI 28.87 kg/m   Physical Exam Gen: afebrile, VSS, NAD HEENT: EOMI, MMM Resp: no resp distress CV: rate WNL Abd: appearance normal, soft, NTND, no r/g/r MsK: ambulating with slightly antalgic gait, favoring left leg; +SLR on R side, NVI with soft compartments, strength and sensation grossly intact in all extremities, distal pulses intact Neuro: A&O x4  ED Course  Procedures Results for orders placed or performed during the [name] encounter of 07/27/17  Urinalysis, Routine w reflex microscopic  Result Value Ref Range   Color, Urine YELLOW YELLOW   APPearance CLEAR CLEAR   Specific Gravity, Urine 1.012 1.005 - 1.030   pH 5.0 5.0 - 8.0   Glucose, UA 50 (A) NEGATIVE mg/dL   Hgb urine dipstick NEGATIVE NEGATIVE   Bilirubin Urine NEGATIVE NEGATIVE   Ketones, ur NEGATIVE NEGATIVE mg/dL   Protein, ur NEGATIVE NEGATIVE mg/dL   Nitrite NEGATIVE NEGATIVE   Leukocytes, UA NEGATIVE NEGATIVE  CBC with Differential   Result Value Ref Range   WBC 13.7 (H) 4.0 - 10.5 K/uL   RBC 4.84 3.87 - 5.11 MIL/uL   Hemoglobin 14.8 12.0 - 15.0 g/dL   HCT 19.141.7 47.836.0 - 29.546.0 %   MCV 86.2 78.0 - 100.0 fL   MCH 30.6 26.0 - 34.0 pg   MCHC 35.5 30.0 - 36.0 g/dL   RDW 62.112.8 30.811.5 - 65.715.5 %   Platelets 296 150 - 400 K/uL   Neutrophils Relative % 67 %   Neutro Abs 9.1 (H) 1.7 - 7.7 K/uL   Lymphocytes Relative 27 %   Lymphs Abs 3.8 0.7 - 4.0 K/uL   Monocytes Relative 5 %   Monocytes Absolute 0.6 0.1 - 1.0 K/uL   Eosinophils Relative 1 %   Eosinophils Absolute 0.2 0.0 - 0.7 K/uL   Basophils Relative 0 %   Basophils Absolute 0.0 0.0 - 0.1 K/uL  Basic metabolic panel  Result Value Ref Range   Sodium 134 (L) 135 - 145 mmol/L   Potassium 3.1 (L) 3.5 - 5.1 mmol/L   Chloride 97 (L) 101 - 111 mmol/L   CO2 26 22 - 32 mmol/L   Glucose, Bld 337 (H) 65 - 99 mg/dL   BUN 9 6 - 20 mg/dL   Creatinine, Ser 8.460.86 0.44 - 1.00 mg/dL   Calcium 9.4 8.9 - 96.210.3 mg/dL   GFR calc non Af Amer >60 >60 mL/min   GFR calc Af Amer >60 >60 mL/min  Anion gap 11 5 - 15   Mr Lumbar Spine Wo Contrast  Result Date: 07/27/2017 CLINICAL DATA:  Low back pain radiating down the right leg EXAM: MRI LUMBAR SPINE WITHOUT CONTRAST TECHNIQUE: Multiplanar, multisequence MR imaging of the lumbar spine was performed. No intravenous contrast was administered. COMPARISON:  04/12/2015 FINDINGS: Segmentation:  Standard. Alignment:  Physiologic. Vertebrae:  No fracture, evidence of discitis, or bone lesion. Conus medullaris: Extends to the T12 level and appears normal. Paraspinal and other soft tissues: No paraspinal abnormality. Disc levels: Disc spaces: Degenerative disc disease with disc desiccation at L4-5 and L5-S1 T12-L1: No significant disc bulge. No evidence of neural foraminal stenosis. No central canal stenosis. L1-L2: No significant disc bulge. No evidence of neural foraminal stenosis. No central canal stenosis. L2-L3: No significant disc bulge. No evidence of  neural foraminal stenosis. No central canal stenosis. L3-L4: No significant disc bulge. No evidence of neural foraminal stenosis. No central canal stenosis. L4-L5: Right paracentral disc protrusion with mild mass effect on the right intraspinal L5 nerve root. No evidence of neural foraminal stenosis. No central canal stenosis. L5-S1: Right paracentral disc protrusion with inferior migration of disc material versus 8 mm sequestered disc fragment with mass effect on the right intraspinal S1 nerve root. No evidence of neural foraminal stenosis. No central canal stenosis. IMPRESSION: 1. At L4-5 there is a right paracentral disc protrusion with mild mass effect on the right intraspinal L5 nerve root. 2. At L5-S1 there is a right paracentral disc protrusion with inferior migration of disc material versus 8 mm sequestered disc fragment with mass effect on the right intraspinal S1 nerve root. Electronically Signed   By: Elige Ko   On: 07/27/2017 14:48     Meds ordered this encounter  Medications  . oxyCODONE-acetaminophen (PERCOCET/ROXICET) 5-325 MG per tablet 1 tablet  . ibuprofen (ADVIL,MOTRIN) tablet 400 mg  . HYDROmorphone (DILAUDID) injection 1 mg  . LORazepam (ATIVAN) injection 1 mg  . morphine 4 MG/ML injection 4 mg  . potassium chloride SA (K-DUR,KLOR-CON) CR tablet 60 mEq  . HYDROmorphone (DILAUDID) injection 1 mg     MDM:   ICD-10-CM   1. Sciatica of right side M54.31   2. Urinary incontinence without sensory awareness N39.42   3. Right leg pain M79.604   4. Type 2 diabetes mellitus with hyperglycemia, without long-term current use of insulin (HCC) E11.65   5. Hypokalemia E87.6    11:55 AM- pt arrived in room, c/o ongoing pain in posterior R buttocks. Ambulatory to rest room. States she's very claustrophobic. Will give ativan and morphine to help with pain and anxiolysis prior to MRI. Pt awaiting to go to MRI at this time. Will get basic labs to ensure no other abnormalities, given that  we're doing advanced imaging. Will reassess shortly  3:40 PM MRI showing L4-5 R paracentral disc protrusion with mild mass effect on R intraspinal L5 nerve root (new finding compared to prior MRI in 2016); also L5-S1 right paracentral disc protrusion with either inferior disc migration vs sequestered disc fragment with mass effect on R intraspinal S1 nerve root (which is fairly similar to prior MRI result). CBC w/diff mildly hemoconcentrated, overall unremarkable. BMP with mildly low K 3.1, given oral repletion here, and hyperglycemia but without anion gap or bicarb changes. Given nerve root impingement, will discuss with neurosurgery; of note, pt hadn't started prednisone or taken robaxin yet. Pt sees Dr. Lovell Sheehan of [facility]. Pt still c/o pain now, will give more  pain meds. Will reassess shortly.   4:01 PM Kim of neurosurgery returning page, will review images and call back with recommendations.   4:16 PM Kim of neurosurgery called back, states since pt's post void residual was minimal, she feels pt could safely be discharged home with instructions to start her steroid dose pack, give pain meds, and f/up with Dr. Lovell Sheehan this week as soon as possible. [place] prior to dispensing controlled substance medications, and 1 year search was notable for: most recent rx's were percocet 10-325mg  #90 tabs dispensed 07/16/17 and Xtampza ER  #60 caps dispensed 07/16/17; rx's for same meds and same quantity filled almost monthly (7/31, 6/25, etc); also has alprazolam 0.5mg  #60 tabs on 06/07/17. Multiple other controlled substance rx's found, filled on fairly regular basis. For this reason, will not be prescribing another rx for controlled substance/narcotic; advised use of home narcotic pain meds/mobic for pain control, in addition to tylenol if needed. Advised use of robaxin from last visit, as well as steroid burst. Discussed other remedies for pain control, such as  warm compresses/etc. F/up with Dr. Lovell Sheehan this week. Strict return precautions advised. Pt ambulatory without difficulty and able to control bladder/bowels during entire ED visit; pain controlled at this time; doubt need for further emergent work up or admission/etc. I explained the diagnosis and have given explicit precautions to return to the ER including for any other new or worsening symptoms. The patient understands and accepts the medical plan as it's been dictated and I have answered their questions. Discharge instructions concerning home care and prescriptions have been given. The patient is STABLE and is discharged to home in good condition.     [address] Tomicka Lover, [place], [place] 07/27/17 1656    Tilden Fossa, MD 07/30/17 1325

## 2017-07-27 NOTE — ED Notes (Signed)
Pt returned from MRI °

## 2017-07-27 NOTE — Discharge Instructions (Signed)
Your MRI shows some mild nerve impingement which is likely the cause of your leg pain. Take your home pain medications as needed for pain, you may also take tylenol/motrin for pain control as well. Start taking the steroids that were given to you at your last ER visit. Use robaxin (given to you last time) as directed as needed for muscle spasms. See the instructions below for further instructions to help with your pain. You will need to follow up with your neurosurgeon as soon as possible for ongoing evaluation of your back pain, call on Monday to make an appointment for this week. Return to the ER for emergent changes or worsening symptoms.   Back Pain: Your back pain should be treated with medicines such as ibuprofen or aleve and this back pain should get better over the next few weeks.  However if you develop severe or worsening pain, low back pain with fever, numbness, weakness or inability to walk or urinate, you should return to the ER immediately.  Please follow up with your doctor this week for a recheck if still having symptoms.  Avoid heavy lifting over 10 pounds over the next two weeks.  Self - care:  The application of heat can help soothe the pain.  Maintaining your daily activities, including walking, is encourged, as it will help you get better faster than just staying in bed. Perform gentle stretching as discussed. Drink plenty of fluids.  Medications are also useful to help with pain control.  A commonly prescribed medication includes your home percocet/xtampza.  Do not drive or operate heavy machinery while taking these medications  Non steroidal anti inflammatory medications including Ibuprofen, naproxen, mobic, etc;  These medications help both pain and swelling and are very useful in treating back pain.  They should be taken with food, as they can cause stomach upset, and more seriously, stomach bleeding.    Muscle relaxants (Robaxin):  These medications can help with muscle tightness  that is a cause of lower back pain.  Most of these medications can cause drowsiness, and it is not safe to drive or use dangerous machinery while taking them.  Steroids: these help with referred pain from nerve impingement; take as directed and until completed.   SEEK IMMEDIATE MEDICAL ATTENTION IF: New numbness, tingling, weakness, or problem with the use of your arms or legs.  Severe back pain not relieved with medications.  Difficulty with or loss of control of your bowel or bladder control.  Increasing pain in any areas of the body (such as chest or abdominal pain).  Shortness of breath, dizziness or fainting.  Nausea (feeling sick to your stomach), vomiting, fever, or sweats.

## 2017-07-27 NOTE — ED Notes (Signed)
EDP at bedside updating patient. 

## 2017-07-27 NOTE — ED Provider Notes (Signed)
[facility] Provider Note   CSN: [id number] Arrival date & time: 07/27/17  0008     History   Chief Complaint Chief Complaint  Patient presents with  . Leg Pain    HPI [name] is a 51 y.o. female.  Patient presents with ongoing right-sided low back pain that radiates down her right leg. This is been ongoing for the past 1 month. She was seen in the ED 3 days ago for the same problem. She is scheduled for an MRI in 2 days.pain radiates down her right leg and is worse with movement. Does have a history of degenerative disc disease and bulging disc with microdiscectomy in the past. She denies any focal weakness, numbness or tingling. She denies any fever or vomiting. She reports episodes where she cannot make it to the bathroom and finds herself dribbling on herself. She denies any saddle anesthesia. She reports taking steroids at home as well as muscle relaxers without relief.   The history is provided by the patient.    Past Medical History:  Diagnosis Date  . BMI (body mass index) 20.0-29.9 2008 192 lbs  . Complication of anesthesia    post op N/V  . Depression   . Diabetes mellitus   . GERD (gastroesophageal reflux disease) 2002   202 lbs  . Hypertension   . Irritable bowel syndrome 2008   diarrhea predominant MEDS TRIED: BENTYL, LEVBID  . LBP (low back pain)   . PONV (postoperative nausea and vomiting)   . PTSD (post-traumatic stress disorder)   . RUQ abdominal pain 2004 HIDA NL CTA w/ IVC-FATTY LIVER, ANGIOMYOLIPOMA LEFT KIDNEY   2008 HIDA NL    Patient Active Problem List   Diagnosis Date Noted  . Shoulder pain, left 04/02/2017  . Postop check 12/02/2015  . Pelvic peritoneal adhesions, female,postsurgical 11/22/2015  . Chronic pelvic pain in female 11/22/2015  . Tubo-ovarian adhesions postsurgical 11/22/2015  . Postoperative female pelvic peritoneal adhesions 11/22/2015  . Para-ovarian adhesion left 11/17/2015  . Globus sensation 05/18/2015  .  Dysphagia, pharyngoesophageal phase 03/29/2015  . Perimenopausal vasomotor symptoms 09/16/2014  . Torticollis 09/16/2013  . Migraine without aura 09/16/2013  . Torticollis, spasmodic 05/26/2013  . Rotator cuff syndrome of right shoulder 05/26/2013  . Pain in joint, upper arm 05/26/2013  . Dyspepsia 03/17/2013  . Dysthymia 01/30/2013  . Pain syndrome, chronic 01/30/2013  . Arthritis of knee 06/21/2011  . Pain in left knee 06/21/2011  . IBS (irritable bowel syndrome) 03/29/2011  . FATIGUE 12/12/2010  . DYSPNEA ON EXERTION 12/12/2010  . GERD 10/21/2008  . Palpitations 10/21/2008  . MURMUR 10/21/2008  . SUBLUXATION PATELLAR (MALALIGNMENT) 04/01/2008  . KNEE PAIN 01/05/2008  . BACK PAIN 01/05/2008    Past Surgical History:  Procedure Laterality Date  . ANKLE SURGERY  RIGHT  . BREAST REDUCTION SURGERY  2006  . COLONOSCOPY  2008 RMR   PAN-COLONIC TICS, NL TI  . COLONOSCOPY  2013   Dr. Teena Dunk: diverticulum in ascending colon, mild diverticulosis in sigmoid. Recommended 5 year surveillance  . ESOPHAGEAL DILATION N/A 04/06/2015   Procedure: ESOPHAGEAL DILATION;  Surgeon: Corbin Ade, MD;  Location: [facility];  Service: Gastroenterology;  Laterality: N/A;  300  . ESOPHAGOGASTRODUODENOSCOPY N/A 04/06/2015   RMR: Normal EGD status post Maloney dilation of teh esophagus as described above  . LAPAROSCOPIC BILATERAL SALPINGO OOPHERECTOMY Bilateral 11/22/2015   Procedure: LAPAROSCOPIC BILATERAL SALPINGO OOPHORECTOMY;  Surgeon: Tilda Burrow, MD;  Location: [facility];  Service: Gynecology;  Laterality: Bilateral;  . LUMBAR DISC SURGERY  2005  . SHOULDER SURGERY  1997 RIGHT  . TOTAL ABDOMINAL HYSTERECTOMY  2000 fibroids/DUB  . UPPER GASTROINTESTINAL ENDOSCOPY  2008 RMR   [facility] HH o/w NL  . vocal cord polyp removal  2013   Dr. Josephina ShihEwain Wilson    OB History    No data available       Home Medications    Prior to Admission medications   Medication Sig Start Date End Date Taking?  Authorizing Provider  aspirin 81 MG tablet Take 81 mg by mouth every morning.     [provider]  atorvastatin (LIPITOR) 10 MG tablet Take 1 tablet by mouth daily. 09/09/13   [provider]  buPROPion (WELLBUTRIN XL) 150 MG 24 hr tablet Take 2 tablets (300 mg total) by mouth every morning. 02/04/17 02/04/18  Myrlene Brokeross, Deborah R, MD  Butalbital-APAP-Caffeine (FIORICET PO) Take by mouth. 1 tablet every 8 hour prn headache    [provider]  Cholecalciferol (VITAMIN D PO) Take 5,000 Units by mouth daily.    [provider]  COMBIVENT RESPIMAT 20-100 MCG/ACT AERS respimat  04/24/16   [provider]  Cyanocobalamin (VITAMIN B-12 PO) Take by mouth daily.    [provider]  DEXILANT 60 MG capsule TAKE 1 CAPSULE BY MOUTH ONCE A DAY. 03/22/17   Tiffany KocherLewis, Leslie S, PA-C  DULoxetine (CYMBALTA) 60 MG capsule Take 1 capsule (60 mg total) by mouth daily. 02/04/17 02/04/18  Myrlene Brokeross, Deborah R, MD  fexofenadine (ALLEGRA) 180 MG tablet Take 180 mg by mouth every morning.     [provider]  fluconazole (DIFLUCAN) 100 MG tablet Take 1 tablet (100 mg total) by mouth daily. 03/16/16   Lazaro ArmsEure, Luther H, MD  furosemide (LASIX) 20 MG tablet Take 20 mg by mouth 2 (two) times daily.      [provider]  HYDROcodone-acetaminophen (NORCO/VICODIN) 5-325 MG tablet One tablet by mouth every six hours as needed for pain.  Seven day limit per Medicaid guidelines. 05/07/17   Darreld McleanKeeling, Wayne, MD  lidocaine (LIDODERM) 5 % Place 1 patch onto the skin every 12 (twelve) hours as needed (for pain). Reported on 03/16/2016 08/24/13   [provider]  Liraglutide (VICTOZA) 18 MG/3ML SOPN Inject 1.8 mg into the skin every evening.     [provider]  LYRICA 75 MG capsule Take 75 mg by mouth 2 (two) times daily.  03/10/13   [provider]  meloxicam (MOBIC) 15 MG tablet 15 mg. 06/26/16   [provider]  metFORMIN (GLUCOPHAGE-XR) 500 MG 24 hr tablet Take  by mouth. Taking 4 Tablets Daily 11/02/14   [provider]  methocarbamol (ROBAXIN) 500 MG tablet Take 1 tablet (500 mg total) by mouth 2 (two) times daily. 07/24/17   Gilda CreasePollina, Christopher J, MD  metoprolol (LOPRESSOR) 100 MG tablet Take 100 mg by mouth 2 (two) times daily.  03/23/11   [provider]  Multiple Vitamin (MULTIVITAMIN) capsule Take 1 capsule by mouth daily.      [provider]  nystatin-triamcinolone ointment (MYCOLOG) Apply 1 application topically 2 (two) times daily. 03/16/16   Lazaro ArmsEure, Luther H, MD  pantoprazole (PROTONIX) 40 MG tablet TAKE (1) TABLET BY MOUTH ONCE DAILY. 12/03/14   Anice PaganiniGill, Eric A, NP  predniSONE (DELTASONE) 20 MG tablet 3 tabs po daily x 3 days, then 2 tabs x 3 days, then 1.5 tabs x 3 days, then 1 tab x 3 days, then 0.5 tabs  x 3 days 07/24/17   Gilda Crease, MD  Probiotic Product (PHILLIPS COLON HEALTH PO) Take 1 capsule by mouth daily.    [provider]  valsartan-hydrochlorothiazide (DIOVAN-HCT) 160-25 MG per tablet Take 1 tablet by mouth daily. 11/02/14   [provider]  VOLTAREN 1 % GEL Apply 2 g topically daily as needed (pain).  10/28/14   [provider]  zolpidem (AMBIEN CR) 12.5 MG CR tablet Take 1 tablet (12.5 mg total) by mouth at bedtime as needed for sleep. 07/12/16 07/12/17  Myrlene Broker, MD    Family History Family History  Problem Relation Age of Onset  . Breast cancer Maternal Aunt   . Anxiety disorder Maternal Aunt   . Anxiety disorder Mother   . Stroke Mother   . COPD Mother   . Hypertension Mother   . Thyroid disease Mother   . Diabetes Mother   . Drug abuse Daughter   . Anxiety disorder Daughter   . Seizures Sister   . Alcohol abuse Maternal Uncle   . Mental illness Cousin   . Dementia Maternal Aunt   . Anxiety disorder Sister   . Stroke Sister   . Anxiety disorder Sister   . Drug abuse Sister   . Early death Sister   . Heart disease Sister   . Heart attack Sister   .  Stroke Maternal Grandmother   . Colon polyps Neg Hx   . Colon cancer Neg Hx   . ADD / ADHD Neg Hx   . Bipolar disorder Neg Hx   . Depression Neg Hx   . OCD Neg Hx   . Paranoid behavior Neg Hx   . Schizophrenia Neg Hx   . Sexual abuse Neg Hx   . Physical abuse Neg Hx     Social History Social History  Substance Use Topics  . Smoking status: Former Smoker    Packs/day: 0.50    Years: 2.00    Types: Cigarettes    Quit date: 05/11/2015  . Smokeless tobacco: Never Used  . Alcohol use No     Allergies   Neurontin [gabapentin]   Review of Systems Review of Systems  Constitutional: Negative for activity change, appetite change, fatigue and fever.  Eyes: Negative for visual disturbance.  Respiratory: Negative for cough, chest tightness and shortness of breath.   Cardiovascular: Negative for chest pain.  Gastrointestinal: Negative for abdominal pain, nausea and vomiting.  Endocrine: Negative for polyuria.  Genitourinary: Positive for urgency. Negative for dysuria, hematuria, vaginal bleeding and vaginal discharge.  Musculoskeletal: Positive for back pain.  Skin: Negative for wound.  Neurological: Negative for dizziness, weakness, numbness and headaches.    all other systems are negative except as noted in the HPI and PMH.    Physical Exam Updated Vital Signs BP (!) 143/87 (BP Location: Right Arm)   Pulse 81   Temp 99.2 F (37.3 C) (Oral)   Resp 15   Ht  (1.803 m)   Wt 93.9 kg (207 lb)   SpO2 98%   BMI 28.87 kg/m   Physical Exam  Constitutional: She is oriented to person, place, and time. She appears well-developed and well-nourished. No distress.  HENT:  Head: Normocephalic and atraumatic.  Mouth/Throat: Oropharynx is clear and moist. No oropharyngeal exudate.  Eyes: Pupils are equal, round, and reactive to light. Conjunctivae and EOM are normal.  Neck: Normal range of motion. Neck supple.  No meningismus.  Cardiovascular: Normal rate, regular rhythm,  normal heart  sounds and intact distal pulses.   No murmur heard. Pulmonary/Chest: Effort normal and breath sounds normal. No respiratory distress.  Abdominal: Soft. There is no tenderness. There is no rebound and no guarding.  Musculoskeletal: Normal range of motion. She exhibits tenderness. She exhibits no edema.  R SI joint pain.  5/5 strength in bilateral lower extremities. Ankle plantar and dorsiflexion intact. Great toe extension intact bilaterally. +2 DP and PT pulses. +2 patellar reflexes bilaterally. Normal gait.   Neurological: She is alert and oriented to person, place, and time. No cranial nerve deficit. She exhibits normal muscle tone. Coordination normal.   5/5 strength throughout. CN 2-12 intact.Equal grip strength.   Skin: Skin is warm.  Psychiatric: She has a normal mood and affect. Her behavior is normal.  Nursing note and vitals reviewed.    ED Treatments / Results  Labs (all labs ordered are listed, but only abnormal results are displayed) Labs Reviewed  URINALYSIS, ROUTINE W REFLEX MICROSCOPIC - Abnormal; Notable for the following:       Result Value   Glucose, UA 50 (*)    All other components within normal limits    EKG  EKG Interpretation None       Radiology No results found.  Procedures Procedures (including critical care time)  Medications Ordered in ED Medications  oxyCODONE-acetaminophen (PERCOCET/ROXICET) 5-325 MG per tablet 1 tablet (1 tablet Oral Given 07/27/17 0351)  ibuprofen (ADVIL,MOTRIN) tablet 400 mg (400 mg Oral Given 07/27/17 0351)     Initial Impression / Assessment and Plan / ED Course  I have reviewed the triage vital signs and the nursing notes.  Pertinent labs & imaging results that were available during my care of the patient were reviewed by me and considered in my medical decision making (see chart for details).    patient reports sciatica-type symptoms over the past 1 month with worsening pain tonight. She was seen 3  days ago for the same thing. She denies any focal weakness, numbness or tingling. She does report 2 separate episodes of urinary incontinence without warning and inability to control her bladder.  She has intact strength and sensation on exam with intact reflexes.  PVR 28 mL.low suspicion for overflow incontinence  Patient continues to complain of ongoing pain. Additional medications given. Given her report of incontinence Will plan for MRI today to rule out significant spinal cord pathology. This is not available at this facility. We'll transfer to [facility]. Discussed with Dr. Adela Lank.  Final Clinical Impressions(s) / ED Diagnoses   Final diagnoses:  Sciatica of right side  Urinary incontinence without sensory awareness    New Prescriptions New Prescriptions   No medications on file     Glynn Octave, MD 07/27/17 [phone]

## 2017-07-27 NOTE — ED Notes (Signed)
Pt transported to MRI 

## 2017-07-27 NOTE — ED Notes (Signed)
Report given to Charge RN [facility]

## 2017-07-27 NOTE — ED Triage Notes (Signed)
Pt states she was seen here earlier in the week for pain shooting down her right leg, told it is sciatica and has mri scheduled for Monday.  Pt states she cannot tolerate the pain

## 2017-12-17 ENCOUNTER — Ambulatory Visit: Payer: Medicare Other | Admitting: Nurse Practitioner

## 2017-12-17 ENCOUNTER — Encounter: Payer: Self-pay | Admitting: Internal Medicine

## 2017-12-17 ENCOUNTER — Telehealth: Payer: Self-pay | Admitting: Nurse Practitioner

## 2017-12-17 NOTE — Telephone Encounter (Signed)
Patient was a no show and letter sent  °

## 2017-12-17 NOTE — Telephone Encounter (Signed)
Noted  

## 2017-12-20 ENCOUNTER — Encounter: Payer: Self-pay | Admitting: Gastroenterology

## 2017-12-20 ENCOUNTER — Ambulatory Visit (INDEPENDENT_AMBULATORY_CARE_PROVIDER_SITE_OTHER): Payer: Medicare Other | Admitting: Gastroenterology

## 2017-12-20 DIAGNOSIS — Z860100 Personal history of colon polyps, unspecified: Secondary | ICD-10-CM

## 2017-12-20 DIAGNOSIS — R1310 Dysphagia, unspecified: Secondary | ICD-10-CM | POA: Diagnosis not present

## 2017-12-20 DIAGNOSIS — R197 Diarrhea, unspecified: Secondary | ICD-10-CM | POA: Diagnosis not present

## 2017-12-20 DIAGNOSIS — R1013 Epigastric pain: Secondary | ICD-10-CM | POA: Diagnosis not present

## 2017-12-20 DIAGNOSIS — R1319 Other dysphagia: Secondary | ICD-10-CM

## 2017-12-20 NOTE — Progress Notes (Signed)
Primary Care Physician: Kirstie Peri, MD  Primary Gastroenterologist:  Roetta Sessions, MD   Chief Complaint  Patient presents with  . Abdominal Pain    everything goes straight through    HPI: [name] is a 52 y.o. female here for follow-up.  Seen in 2015-04-19.  History of GERD, globus, dysphagia, IBS.  Personal history of polyps with last colonoscopy 2012/04/18 by Dr. Teena Dunk, recommending 5-year follow-up.  Maternal aunt with colon cancer in her 55s.  Last endoscopy 04/19/2015 with normal-appearing esophagus status post dilation with out apparent complication.  No other findings.  For the past 5 months she has had increased issues with diarrhea.  Typically was able to control IBS with dietary avoidance.  Complains of postprandial loose stool with most anything she eats.  Explosive diarrhea.  Been going on for about 5 months.  Avoids eating when she has to go out.  Associated abdominal pain mostly on the right side does improve with bowel movement.  Also with epigastric pain which is more persistent.  No recent antibiotic use.  No ill contacts.  No well water.  Under a lot of stress.  Her daughter passed away in 05/31/2017after open heart surgery for mitral valve repair at age 67.  She is now raising her 51 year old granddaughters.  Her mother died in June 19, 2023 of last year.  She also has a history of brother and sister both dying at a young age, her sister was 76 and died of an MI.  She has been in therapy ever since her brother died in Apr 18, 2001.  Denies melena, rectal bleeding, heartburn.  No weight loss.  Weight tends to fluctuate anywhere from 190 pounds to 205 pounds.  Complains of dysphagia to solid foods and large pills.  Previous dilation helped her for over a year.  Current Outpatient Medications  Medication Sig Dispense Refill  . aspirin 81 MG tablet Take 81 mg by mouth every morning.     Marland Kitchen atorvastatin (LIPITOR) 10 MG tablet Take 10 mg by mouth daily.     Marland Kitchen buPROPion (WELLBUTRIN XL) 150 MG 24 hr  tablet Take 2 tablets (300 mg total) by mouth every morning. 60 tablet 0  . busPIRone (BUSPAR) 10 MG tablet Take 10 mg by mouth 3 (three) times daily.    . Butalbital-APAP-Caffeine (FIORICET) 50-300-40 MG CAPS Take 1 tablet by mouth every 8 (eight) hours as needed (headache).     . Cholecalciferol (VITAMIN D PO) Take 5,000 Units by mouth daily.    . COMBIVENT RESPIMAT 20-100 MCG/ACT AERS respimat Inhale 2 puffs into the lungs every 6 (six) hours as needed for wheezing or shortness of breath.     . Cyanocobalamin (VITAMIN B-12 PO) Take 1 tablet by mouth daily.     Marland Kitchen DEXILANT 60 MG capsule TAKE 1 CAPSULE BY MOUTH ONCE A DAY. (Patient taking differently: TAKE 60mg  BY MOUTH ONCE A DAY.) 30 capsule 3  . DULoxetine (CYMBALTA) 60 MG capsule Take 1 capsule (60 mg total) by mouth daily. (Patient taking differently: Take 120 mg by mouth daily. ) 30 capsule 0  . fexofenadine (ALLEGRA) 180 MG tablet Take 180 mg by mouth every morning.     . furosemide (LASIX) 20 MG tablet Take 10 mg by mouth daily as needed for fluid.     . Liraglutide (VICTOZA) 18 MG/3ML SOPN Inject 1.8 mg into the skin every evening.     . metFORMIN (GLUCOPHAGE) 500 MG tablet Take 1,000 mg by  mouth 2 (two) times daily with a meal.    . metoprolol (LOPRESSOR) 100 MG tablet Take 100 mg by mouth 2 (two) times daily.     . mirtazapine (REMERON) 15 MG tablet Take 15 mg by mouth at bedtime.    . Multiple Vitamin (MULTIVITAMIN) capsule Take 1 capsule by mouth daily.      Marland Kitchen oxyCODONE-acetaminophen (PERCOCET) 10-325 MG tablet Take 1 tablet by mouth 3 (three) times daily as needed for pain.  0  . oxyCODONE-acetaminophen (PERCOCET) 10-325 MG tablet Take 1 tablet by mouth every 4 (four) hours as needed for pain.    . Probiotic Product (PHILLIPS COLON HEALTH PO) Take 1 capsule by mouth daily.    . valsartan-hydrochlorothiazide (DIOVAN-HCT) 160-25 MG per tablet Take 1 tablet by mouth daily.    . VOLTAREN 1 % GEL Apply 2 g topically daily as needed  (pain).     Marlowe Kays ER 18 MG C12A Take 18 mg by mouth every 12 (twelve) hours as needed.  0  . zolpidem (AMBIEN CR) 12.5 MG CR tablet Take 1 tablet (12.5 mg total) by mouth at bedtime as needed for sleep. (Patient not taking: Reported on 07/27/2017) 30 tablet 2   No current facility-administered medications for this visit.     Allergies as of 12/20/2017 - Review Complete 12/20/2017  Allergen Reaction Noted  . Neurontin [gabapentin] Other (See Comments) 03/24/2013   Past Medical History:  Diagnosis Date  . BMI (body mass index) 20.0-29.9 2008 192 lbs  . Complication of anesthesia    post op N/V  . Depression   . Diabetes mellitus   . GERD (gastroesophageal reflux disease) 2002   202 lbs  . Hypertension   . Irritable bowel syndrome 2008   diarrhea predominant MEDS TRIED: BENTYL, LEVBID  . LBP (low back pain)   . PONV (postoperative nausea and vomiting)   . PTSD (post-traumatic stress disorder)   . RUQ abdominal pain 2004 HIDA NL CTA w/ IVC-FATTY LIVER, ANGIOMYOLIPOMA LEFT KIDNEY   2008 HIDA NL   Past Surgical History:  Procedure Laterality Date  . ANKLE SURGERY  RIGHT  . BREAST REDUCTION SURGERY  2006  . COLONOSCOPY  2008 RMR   PAN-COLONIC TICS, NL TI  . COLONOSCOPY  2013   Dr. Teena Dunk: diverticulum in ascending colon, mild diverticulosis in sigmoid. Recommended 5 year surveillance  . ESOPHAGEAL DILATION N/A 04/06/2015   Procedure: ESOPHAGEAL DILATION;  Surgeon: Corbin Ade, MD;  Location: [facility];  Service: Gastroenterology;  Laterality: N/A;  300  . ESOPHAGOGASTRODUODENOSCOPY N/A 04/06/2015   RMR: Normal EGD status post Maloney dilation of teh esophagus as described above  . LAPAROSCOPIC BILATERAL SALPINGO OOPHERECTOMY Bilateral 11/22/2015   Procedure: LAPAROSCOPIC BILATERAL SALPINGO OOPHORECTOMY;  Surgeon: Tilda Burrow, MD;  Location: [facility];  Service: Gynecology;  Laterality: Bilateral;  . LUMBAR DISC SURGERY  2005  . SHOULDER SURGERY  1997 RIGHT  . TOTAL  ABDOMINAL HYSTERECTOMY  2000 fibroids/DUB  . UPPER GASTROINTESTINAL ENDOSCOPY  2008 RMR   SML HH o/w NL  . vocal cord polyp removal  2013   Dr. Josephina Shih   Family History  Problem Relation Age of Onset  . Breast cancer Maternal Aunt   . Anxiety disorder Maternal Aunt   . Anxiety disorder Mother   . Stroke Mother   . COPD Mother   . Hypertension Mother   . Thyroid disease Mother   . Diabetes Mother   . Drug abuse Daughter   . Anxiety  disorder Daughter   . Seizures Sister   . Alcohol abuse Maternal Uncle   . Mental illness Cousin   . Dementia Maternal Aunt   . Anxiety disorder Sister   . Stroke Sister   . Anxiety disorder Sister   . Drug abuse Sister   . Early death Sister   . Heart disease Sister   . Heart attack Sister   . Stroke Maternal Grandmother   . Colon cancer Maternal Aunt        in her 950s  . Pancreatic cancer Maternal Aunt        great aunt  . Colon polyps Neg Hx   . ADD / ADHD Neg Hx   . Bipolar disorder Neg Hx   . Depression Neg Hx   . OCD Neg Hx   . Paranoid behavior Neg Hx   . Schizophrenia Neg Hx   . Sexual abuse Neg Hx   . Physical abuse Neg Hx    Social History   Tobacco Use  . Smoking status: Former Smoker    Packs/day: 0.50    Years: 2.00    Pack years: 1.00    Types: Cigarettes    Last attempt to quit: 05/11/2015    Years since quitting: 2.6  . Smokeless tobacco: Never Used  Substance Use Topics  . Alcohol use: No    Alcohol/week: 0.0 oz  . Drug use: No    ROS:  General: Negative for anorexia, weight loss, fever, chills, fatigue, weakness. ENT: Negative for hoarseness, difficulty swallowing , nasal congestion. CV: Negative for chest pain, angina, palpitations, dyspnea on exertion, peripheral edema.  Respiratory: Negative for dyspnea at rest, dyspnea on exertion, cough, sputum, wheezing.  GI: See history of present illness. GU:  Negative for dysuria, hematuria, urinary incontinence, urinary frequency, nocturnal urination.    Endo: Negative for unusual weight change.    Physical Examination:   BP 125/87   Pulse 93   Temp 97.9 F (36.6 C) (Oral)   Ht 5\' 11"  (1.803 m)   Wt 190 lb 3.2 oz (86.3 kg)   BMI 26.53 kg/m   General: Well-nourished, well-developed in no acute distress.  Eyes: No icterus. Mouth: Oropharyngeal mucosa moist and pink , no lesions erythema or exudate. Lungs: Clear to auscultation bilaterally.  Heart: Regular rate and rhythm, no murmurs rubs or gallops.  Abdomen: Bowel sounds are normal, mild epigastric tenderness, nondistended, no hepatosplenomegaly or masses, no abdominal bruits or hernia , no rebound or guarding.   Extremities: No lower extremity edema. No clubbing or deformities. Neuro: Alert and oriented x 4   Skin: Warm and dry, no jaundice.   Psych: Alert and cooperative, normal mood and affect.   Imaging Studies: No results found.

## 2017-12-20 NOTE — Patient Instructions (Signed)
1. Colonoscopy and upper endoscopy as scheduled. See separate instructions.  

## 2017-12-23 ENCOUNTER — Other Ambulatory Visit: Payer: Self-pay

## 2017-12-23 ENCOUNTER — Telehealth: Payer: Self-pay

## 2017-12-23 DIAGNOSIS — Z86010 Personal history of colonic polyps: Secondary | ICD-10-CM

## 2017-12-23 DIAGNOSIS — R197 Diarrhea, unspecified: Secondary | ICD-10-CM

## 2017-12-23 DIAGNOSIS — R1013 Epigastric pain: Secondary | ICD-10-CM

## 2017-12-23 DIAGNOSIS — R1310 Dysphagia, unspecified: Secondary | ICD-10-CM

## 2017-12-23 NOTE — Assessment & Plan Note (Signed)
Epigastric pain, dysphagia uncontrolled by PPI.  Question gastritis/peptic ulcer disease versus functional dyspepsia.  Plan on upper endoscopy with possible esophageal dilation with deep sedation in the near future.  I have discussed the risks, alternatives, benefits with regards to but not limited to the risk of reaction to medication, bleeding, infection, perforation and the patient is agreeable to proceed. Written consent to be obtained.

## 2017-12-23 NOTE — Telephone Encounter (Signed)
Tried to call pt to inform of pre-op appt 01/14/18 at 10:00am, no answer, LMOVM. Letter mailed.

## 2017-12-23 NOTE — Assessment & Plan Note (Addendum)
52 year old female with history of GERD, IBS, dysphagia, personal history of colon polyps, family history of colon cancer who presents for follow-up with complaints of 7168-month history of increased diarrhea/abdominal pain.  She has had increased emotional stress.  Also multiple close family members of the last 2 years.  Raising her grandchildren after her daughter has passed away.  Suspect IVSD however given her personal history of polyps, last colonoscopy over 5 years ago we will update colonoscopy at this time to rule out other etiologies such as IBD.  Doubt infectious etiology.  Given polypharmacy we will plan on deep sedation.  I have discussed the risks, alternatives, benefits with regards to but not limited to the risk of reaction to medication, bleeding, infection, perforation and the patient is agreeable to proceed. Written consent to be obtained.  Previous celiac serologies negative in 2016.

## 2017-12-24 NOTE — Progress Notes (Signed)
CC'ED TO PCP 

## 2018-01-10 NOTE — Patient Instructions (Signed)
[name]  01/10/2018     @PREFPERIOPPHARMACY @   Your procedure is scheduled on  01/20/2018 .  [facility] at  640  A.M.  Call this number if you have problems the morning of surgery:  [phone]   Remember:  Do not eat food or drink liquids after midnight.  Take these medicines the morning of surgery with A SIP OF WATER  Wellbutrin, buspar, fioricet, dexilant, cymbalta, allegra, metoprolol, percocet, diovan, Xtamza. Use your inhaler before you come.   Do not wear jewelry, make-up or nail polish.  Do not wear lotions, powders, or perfumes, or deodorant.  Do not shave 48 hours prior to surgery.  Men may shave face and neck.  Do not bring valuables to the hospital.  [facility] any belongings or valuables.  Contacts, dentures or bridgework may not be worn into surgery.  Leave your suitcase in the car.  After surgery it may be brought to your room.  For patients admitted to the hospital, discharge time will be determined by your treatment team.  Patients discharged the day of surgery will not be allowed to drive home.   Name and phone number of your driver:   family Special instructions:  Follow the diet and prep instructions given to you by Dr Luvenia Starchourk's office.  Please read over the following fact sheets that you were given. Anesthesia Post-op Instructions and Care and Recovery After Surgery       Esophagogastroduodenoscopy Esophagogastroduodenoscopy (EGD) is a procedure to examine the lining of the esophagus, stomach, and first part of the small intestine (duodenum). This procedure is done to check for problems such as inflammation, bleeding, ulcers, or growths. During this procedure, a long, flexible, lighted tube with a camera attached (endoscope) is inserted down the throat. Tell a health care provider about:  Any allergies you have.  All medicines you are taking, including vitamins, herbs, eye drops,  creams, and over-the-counter medicines.  Any problems you or family members have had with anesthetic medicines.  Any blood disorders you have.  Any surgeries you have had.  Any medical conditions you have.  Whether you are pregnant or may be pregnant. What are the risks? Generally, this is a safe procedure. However, problems may occur, including:  Infection.  Bleeding.  A tear (perforation) in the esophagus, stomach, or duodenum.  Trouble breathing.  Excessive sweating.  Spasms of the larynx.  A slowed heartbeat.  Low blood pressure.  What happens before the procedure?  Follow instructions from your health care provider about eating or drinking restrictions.  Ask your health care provider about: ? Changing or stopping your regular medicines. This is especially important if you are taking diabetes medicines or blood thinners. ? Taking medicines such as aspirin and ibuprofen. These medicines can thin your blood. Do not take these medicines before your procedure if your health care provider instructs you not to.  Plan to have someone take you home after the procedure.  If you wear dentures, be ready to remove them before the procedure. What happens during the procedure?  To reduce your risk of infection, your health care team will wash or sanitize their hands.  An IV tube will be put in a vein in your hand or arm. You will get medicines and fluids through this tube.  You will be given one or more of the following: ? A  medicine to help you relax (sedative). ? A medicine to numb the area (local anesthetic). This medicine may be sprayed into your throat. It will make you feel more comfortable and keep you from gagging or coughing during the procedure. ? A medicine for pain.  A mouth guard may be placed in your mouth to protect your teeth and to keep you from biting on the endoscope.  You will be asked to lie on your left side.  The endoscope will be lowered down your  throat into your esophagus, stomach, and duodenum.  Air will be put into the endoscope. This will help your health care provider see better.  The lining of your esophagus, stomach, and duodenum will be examined.  Your health care provider may: ? Take a tissue sample so it can be looked at in a lab (biopsy). ? Remove growths. ? Remove objects (foreign bodies) that are stuck. ? Treat any bleeding with medicines or other devices that stop tissue from bleeding. ? Widen (dilate) or stretch narrowed areas of your esophagus and stomach.  The endoscope will be taken out. The procedure may vary among health care providers and hospitals. What happens after the procedure?  Your blood pressure, heart rate, breathing rate, and blood oxygen level will be monitored often until the medicines you were given have worn off.  Do not eat or drink anything until the numbing medicine has worn off and your gag reflex has returned. This information is not intended to replace advice given to you by your health care provider. Make sure you discuss any questions you have with your health care provider. Document Released: 03/08/2005 Document Revised: 04/12/2016 Document Reviewed: 09/29/2015 Elsevier Interactive Patient Education  2018 [employer]. Esophagogastroduodenoscopy, Care After Refer to this sheet in the next few weeks. These instructions provide you with information about caring for yourself after your procedure. Your health care provider may also give you more specific instructions. Your treatment has been planned according to current medical practices, but problems sometimes occur. Call your health care provider if you have any problems or questions after your procedure. What can I expect after the procedure? After the procedure, it is common to have:  A sore throat.  Nausea.  Bloating.  Dizziness.  Fatigue.  Follow these instructions at home:  Do not eat or drink anything until the numbing  medicine (local anesthetic) has worn off and your gag reflex has returned. You will know that the local anesthetic has worn off when you can swallow comfortably.  Do not drive for 24 hours if you received a medicine to help you relax (sedative).  If your health care provider took a tissue sample for testing during the procedure, make sure to get your test results. This is your responsibility. Ask your health care provider or the department performing the test when your results will be ready.  Keep all follow-up visits as told by your health care provider. This is important. Contact a health care provider if:  You cannot stop coughing.  You are not urinating.  You are urinating less than usual. Get help right away if:  You have trouble swallowing.  You cannot eat or drink.  You have throat or chest pain that gets worse.  You are dizzy or light-headed.  You faint.  You have nausea or vomiting.  You have chills.  You have a fever.  You have severe abdominal pain.  You have black, tarry, or bloody stools. This information is not intended to replace  advice given to you by your health care provider. Make sure you discuss any questions you have with your health care provider. Document Released: 10/22/2012 Document Revised: 04/12/2016 Document Reviewed: 09/29/2015 Elsevier Interactive Patient Education  2018 [employer].  Esophageal Dilatation Esophageal dilatation is a procedure to open a blocked or narrowed part of the esophagus. The esophagus is the long tube in your throat that carries food and liquid from your mouth to your stomach. The procedure is also called esophageal dilation. You may need this procedure if you have a buildup of scar tissue in your esophagus that makes it difficult, painful, or even impossible to swallow. This can be caused by gastroesophageal reflux disease (GERD). In rare cases, people need this procedure because they have cancer of the esophagus or a  problem with the way food moves through the esophagus. Sometimes you may need to have another dilatation to enlarge the opening of the esophagus gradually. Tell a health care provider about:  Any allergies you have.  All medicines you are taking, including vitamins, herbs, eye drops, creams, and over-the-counter medicines.  Any problems you or family members have had with anesthetic medicines.  Any blood disorders you have.  Any surgeries you have had.  Any medical conditions you have.  Any antibiotic medicines you are required to take before dental procedures. What are the risks? Generally, this is a safe procedure. However, problems can occur and include:  Bleeding from a tear in the lining of the esophagus.  A hole (perforation) in the esophagus.  What happens before the procedure?  Do not eat or drink anything after midnight on the night before the procedure or as directed by your health care provider.  Ask your health care provider about changing or stopping your regular medicines. This is especially important if you are taking diabetes medicines or blood thinners.  Plan to have someone take you home after the procedure. What happens during the procedure?  You will be given a medicine that makes you relaxed and sleepy (sedative).  A medicine may be sprayed or gargled to numb the back of the throat.  Your health care provider can use various instruments to do an esophageal dilatation. During the procedure, the instrument used will be placed in your mouth and passed down into your esophagus. Options include: ? Simple dilators. This instrument is carefully placed in the esophagus to stretch it. ? Guided wire bougies. In this method, a flexible tube (endoscope) is used to insert a wire into the esophagus. The dilator is passed over this wire to enlarge the esophagus. Then the wire is removed. ? Balloon dilators. An endoscope with a small balloon at the end is passed down into  the esophagus. Inflating the balloon gently stretches the esophagus and opens it up. What happens after the procedure?  Your blood pressure, heart rate, breathing rate, and blood oxygen level will be monitored often until the medicines you were given have worn off.  Your throat may feel slightly sore and will probably still feel numb. This will improve slowly over time.  You will not be allowed to eat or drink until the throat numbness has resolved.  If this is a same-day procedure, you may be allowed to go home once you have been able to drink, urinate, and sit on the edge of the bed without nausea or dizziness.  If this is a same-day procedure, you should have a friend or family member with you for the next 24 hours after the  procedure. This information is not intended to replace advice given to you by your health care provider. Make sure you discuss any questions you have with your health care provider. Document Released: 12/27/2005 Document Revised: 04/12/2016 Document Reviewed: 03/17/2014 Elsevier Interactive Patient Education  [employer].  Colonoscopy, Adult A colonoscopy is an exam to look at the large intestine. It is done to check for problems, such as:  Lumps (tumors).  Growths (polyps).  Swelling (inflammation).  Bleeding.  What happens before the procedure? Eating and drinking Follow instructions from your doctor about eating and drinking. These instructions may include:  A few days before the procedure - follow a low-fiber diet. ? Avoid nuts. ? Avoid seeds. ? Avoid dried fruit. ? Avoid raw fruits. ? Avoid vegetables.  1-3 days before the procedure - follow a clear liquid diet. Avoid liquids that have red or purple dye. Drink only clear liquids, such as: ? Clear broth or bouillon. ? Black coffee or tea. ? Clear juice. ? Clear soft drinks or sports drinks. ? Gelatin dessert. ? Popsicles.  On the day of the procedure - do not eat or drink anything  during the 2 hours before the procedure.  Bowel prep If you were prescribed an oral bowel prep:  Take it as told by your doctor. Starting the day before your procedure, you will need to drink a lot of liquid. The liquid will cause you to poop (have bowel movements) until your poop is almost clear or light green.  If your skin or butt gets irritated from diarrhea, you may: ? Wipe the area with wipes that have medicine in them, such as adult wet wipes with aloe and vitamin E. ? Put something on your skin that soothes the area, such as petroleum jelly.  If you throw up (vomit) while drinking the bowel prep, take a break for up to 60 minutes. Then begin the bowel prep again. If you keep throwing up and you cannot take the bowel prep without throwing up, call your doctor.  General instructions  Ask your doctor about changing or stopping your normal medicines. This is important if you take diabetes medicines or blood thinners.  Plan to have someone take you home from the hospital or clinic. What happens during the procedure?  An IV tube may be put into one of your veins.  You will be given medicine to help you relax (sedative).  To reduce your risk of infection: ? Your doctors will wash their hands. ? Your anal area will be washed with soap.  You will be asked to lie on your side with your knees bent.  Your doctor will get a long, thin, flexible tube ready. The tube will have a camera and a light on the end.  The tube will be put into your anus.  The tube will be gently put into your large intestine.  Air will be delivered into your large intestine to keep it open. You may feel some pressure or cramping.  The camera will be used to take photos.  A small tissue sample may be removed from your body to be looked at under a microscope (biopsy). If any possible problems are found, the tissue will be sent to a lab for testing.  If small growths are found, your doctor may remove them and  have them checked for cancer.  The tube that was put into your anus will be slowly removed. The procedure may vary among doctors and hospitals. What happens after  the procedure?  Your doctor will check on you often until the medicines you were given have worn off.  Do not drive for 24 hours after the procedure.  You may have a small amount of blood in your poop.  You may pass gas.  You may have mild cramps or bloating in your belly (abdomen).  It is up to you to get the results of your procedure. Ask your doctor, or the department performing the procedure, when your results will be ready. This information is not intended to replace advice given to you by your health care provider. Make sure you discuss any questions you have with your health care provider. Document Released: 12/08/2010 Document Revised: 09/05/2016 Document Reviewed: 01/17/2016 Elsevier Interactive Patient Education  2017 [employer]  Colonoscopy, Adult, Care After This sheet gives you information about how to care for yourself after your procedure. Your health care provider may also give you more specific instructions. If you have problems or questions, contact your health care provider. What can I expect after the procedure? After the procedure, it is common to have:  A small amount of blood in your stool for 24 hours after the procedure.  Some gas.  Mild abdominal cramping or bloating.  Follow these instructions at home: General instructions   For the first 24 hours after the procedure: ? Do not drive or use machinery. ? Do not sign important documents. ? Do not drink alcohol. ? Do your regular daily activities at a slower pace than normal. ? Eat soft, easy-to-digest foods. ? Rest often.  Take over-the-counter or prescription medicines only as told by your health care provider.  It is up to you to get the results of your procedure. Ask your health care provider, or the department performing the  procedure, when your results will be ready. Relieving cramping and bloating  Try walking around when you have cramps or feel bloated.  Apply heat to your abdomen as told by your health care provider. Use a heat source that your health care provider recommends, such as a moist heat pack or a heating pad. ? Place a towel between your skin and the heat source. ? Leave the heat on for 20-30 minutes. ? Remove the heat if your skin turns bright red. This is especially important if you are unable to feel pain, heat, or cold. You may have a greater risk of getting burned. Eating and drinking  Drink enough fluid to keep your urine clear or pale yellow.  Resume your normal diet as instructed by your health care provider. Avoid heavy or fried foods that are hard to digest.  Avoid drinking alcohol for as long as instructed by your health care provider. Contact a health care provider if:  You have blood in your stool 2-3 days after the procedure. Get help right away if:  You have more than a small spotting of blood in your stool.  You pass large blood clots in your stool.  Your abdomen is swollen.  You have nausea or vomiting.  You have a fever.  You have increasing abdominal pain that is not relieved with medicine. This information is not intended to replace advice given to you by your health care provider. Make sure you discuss any questions you have with your health care provider. Document Released: 06/19/2004 Document Revised: 07/30/2016 Document Reviewed: 01/17/2016 Elsevier Interactive Patient Education  2018 [place] Anesthesia is a term that refers to techniques, procedures, and medicines that help  a person stay safe and comfortable during a medical procedure. Monitored anesthesia care, or sedation, is one type of anesthesia. Your anesthesia specialist may recommend sedation if you will be having a procedure that does not require you to be unconscious,  such as:  Cataract surgery.  A dental procedure.  A biopsy.  A colonoscopy.  During the procedure, you may receive a medicine to help you relax (sedative). There are three levels of sedation:  Mild sedation. At this level, you may feel awake and relaxed. You will be able to follow directions.  Moderate sedation. At this level, you will be sleepy. You may not remember the procedure.  Deep sedation. At this level, you will be asleep. You will not remember the procedure.  The more medicine you are given, the deeper your level of sedation will be. Depending on how you respond to the procedure, the anesthesia specialist may change your level of sedation or the type of anesthesia to fit your needs. An anesthesia specialist will monitor you closely during the procedure. Let your health care provider know about:  Any allergies you have.  All medicines you are taking, including vitamins, herbs, eye drops, creams, and over-the-counter medicines.  Any use of steroids (by mouth or as a cream).  Any problems you or family members have had with sedatives and anesthetic medicines.  Any blood disorders you have.  Any surgeries you have had.  Any medical conditions you have, such as sleep apnea.  Whether you are pregnant or may be pregnant.  Any use of cigarettes, alcohol, or street drugs. What are the risks? Generally, this is a safe procedure. However, problems may occur, including:  Getting too much medicine (oversedation).  Nausea.  Allergic reaction to medicines.  Trouble breathing. If this happens, a breathing tube may be used to help with breathing. It will be removed when you are awake and breathing on your own.  Heart trouble.  Lung trouble.  Before the procedure Staying hydrated Follow instructions from your health care provider about hydration, which may include:  Up to 2 hours before the procedure - you may continue to drink clear liquids, such as water, clear  fruit juice, black coffee, and plain tea.  Eating and drinking restrictions Follow instructions from your health care provider about eating and drinking, which may include:  8 hours before the procedure - stop eating heavy meals or foods such as meat, fried foods, or fatty foods.  6 hours before the procedure - stop eating light meals or foods, such as toast or cereal.  6 hours before the procedure - stop drinking milk or drinks that contain milk.  2 hours before the procedure - stop drinking clear liquids.  Medicines Ask your health care provider about:  Changing or stopping your regular medicines. This is especially important if you are taking diabetes medicines or blood thinners.  Taking medicines such as aspirin and ibuprofen. These medicines can thin your blood. Do not take these medicines before your procedure if your health care provider instructs you not to.  Tests and exams  You will have a physical exam.  You may have blood tests done to show: ? How well your kidneys and liver are working. ? How well your blood can clot.  General instructions  Plan to have someone take you home from the hospital or clinic.  If you will be going home right after the procedure, plan to have someone with you for 24 hours.  What happens during the  procedure?  Your blood pressure, heart rate, breathing, level of pain and overall condition will be monitored.  An IV tube will be inserted into one of your veins.  Your anesthesia specialist will give you medicines as needed to keep you comfortable during the procedure. This may mean changing the level of sedation.  The procedure will be performed. After the procedure  Your blood pressure, heart rate, breathing rate, and blood oxygen level will be monitored until the medicines you were given have worn off.  Do not drive for 24 hours if you received a sedative.  You may: ? Feel sleepy, clumsy, or nauseous. ? Feel forgetful about what  happened after the procedure. ? Have a sore throat if you had a breathing tube during the procedure. ? Vomit. This information is not intended to replace advice given to you by your health care provider. Make sure you discuss any questions you have with your health care provider. Document Released: 08/01/2005 Document Revised: 04/13/2016 Document Reviewed: 02/26/2016 Elsevier Interactive Patient Education  2018 [place], Care After These instructions provide you with information about caring for yourself after your procedure. Your health care provider may also give you more specific instructions. Your treatment has been planned according to current medical practices, but problems sometimes occur. Call your health care provider if you have any problems or questions after your procedure. What can I expect after the procedure? After your procedure, it is common to:  Feel sleepy for several hours.  Feel clumsy and have poor balance for several hours.  Feel forgetful about what happened after the procedure.  Have poor judgment for several hours.  Feel nauseous or vomit.  Have a sore throat if you had a breathing tube during the procedure.  Follow these instructions at home: For at least 24 hours after the procedure:   Do not: ? Participate in activities in which you could fall or become injured. ? Drive. ? Use heavy machinery. ? Drink alcohol. ? Take sleeping pills or medicines that cause drowsiness. ? Make important decisions or sign legal documents. ? Take care of children on your own.  Rest. Eating and drinking  Follow the diet that is recommended by your health care provider.  If you vomit, drink water, juice, or soup when you can drink without vomiting.  Make sure you have little or no nausea before eating solid foods. General instructions  Have a responsible adult stay with you until you are awake and alert.  Take over-the-counter and  prescription medicines only as told by your health care provider.  If you smoke, do not smoke without supervision.  Keep all follow-up visits as told by your health care provider. This is important. Contact a health care provider if:  You keep feeling nauseous or you keep vomiting.  You feel light-headed.  You develop a rash.  You have a fever. Get help right away if:  You have trouble breathing. This information is not intended to replace advice given to you by your health care provider. Make sure you discuss any questions you have with your health care provider. Document Released: 02/26/2016 Document Revised: 06/27/2016 Document Reviewed: 02/26/2016 Elsevier Interactive Patient Education  [employer].

## 2018-01-14 ENCOUNTER — Encounter (HOSPITAL_COMMUNITY): Payer: Self-pay

## 2018-01-14 ENCOUNTER — Other Ambulatory Visit: Payer: Self-pay

## 2018-01-14 ENCOUNTER — Encounter (HOSPITAL_COMMUNITY)
Admission: RE | Admit: 2018-01-14 | Discharge: 2018-01-14 | Disposition: A | Discharge status: Home / Self Care | Payer: Medicare Other | Source: Ambulatory Visit | Attending: Internal Medicine | Admitting: Internal Medicine

## 2018-01-14 DIAGNOSIS — Z86010 Personal history of colonic polyps: Secondary | ICD-10-CM | POA: Diagnosis not present

## 2018-01-14 DIAGNOSIS — R197 Diarrhea, unspecified: Secondary | ICD-10-CM | POA: Insufficient documentation

## 2018-01-14 DIAGNOSIS — R1013 Epigastric pain: Secondary | ICD-10-CM | POA: Insufficient documentation

## 2018-01-14 DIAGNOSIS — R1310 Dysphagia, unspecified: Secondary | ICD-10-CM | POA: Diagnosis not present

## 2018-01-14 DIAGNOSIS — Z01818 Encounter for other preprocedural examination: Secondary | ICD-10-CM | POA: Diagnosis present

## 2018-01-14 HISTORY — DX: Unspecified osteoarthritis, unspecified site: M19.90

## 2018-01-14 HISTORY — DX: Fibromyalgia: M79.7

## 2018-01-14 LAB — BASIC METABOLIC PANEL
Anion gap: 16 — ABNORMAL HIGH (ref 5–15)
BUN: 10 mg/dL (ref 6–20)
CHLORIDE: 100 mmol/L — AB (ref 101–111)
CO2: 24 mmol/L (ref 22–32)
CREATININE: 0.71 mg/dL (ref 0.44–1.00)
Calcium: 9 mg/dL (ref 8.9–10.3)
GFR calc Af Amer: >60 mL/min (ref >60)
GFR calc non Af Amer: >60 mL/min (ref >60)
GLUCOSE: 164 mg/dL — AB (ref 65–99)
POTASSIUM: 2.9 mmol/L — AB (ref 3.5–5.1)
Sodium: 140 mmol/L (ref 135–145)

## 2018-01-14 LAB — CBC WITH DIFFERENTIAL/PLATELET
Basophils Absolute: 0 10*3/uL (ref 0.0–0.1)
Basophils Relative: 0 %
EOS ABS: 0.2 10*3/uL (ref 0.0–0.7)
Eosinophils Relative: 2 %
HEMATOCRIT: 41.6 % (ref 36.0–46.0)
HEMOGLOBIN: 14 g/dL (ref 12.0–15.0)
LYMPHS ABS: 4.2 10*3/uL — AB (ref 0.7–4.0)
LYMPHS PCT: 32 %
MCH: 30.6 pg (ref 26.0–34.0)
MCHC: 33.7 g/dL (ref 30.0–36.0)
MCV: 91 fL (ref 78.0–100.0)
MONOS PCT: 6 %
Monocytes Absolute: 0.8 10*3/uL (ref 0.1–1.0)
NEUTROS PCT: 60 %
Neutro Abs: 7.8 10*3/uL — ABNORMAL HIGH (ref 1.7–7.7)
Platelets: 334 10*3/uL (ref 150–400)
RBC: 4.57 MIL/uL (ref 3.87–5.11)
RDW: 13.4 % (ref 11.5–15.5)
WBC: 13 10*3/uL — AB (ref 4.0–10.5)

## 2018-01-15 ENCOUNTER — Telehealth: Payer: Self-pay | Admitting: Internal Medicine

## 2018-01-15 MED ORDER — POTASSIUM CHLORIDE ER 20 MEQ PO TBCR: 20.0000 meq | EXTENDED_RELEASE_TABLET | Freq: Two times a day (BID) | ORAL | 0 refills | Status: DC

## 2018-01-15 NOTE — Addendum Note (Signed)
Addended by: Tiffany KocherLEWIS, Sander Speckman S on: 01/15/2018 02:25 PM   Modules accepted: Orders

## 2018-01-15 NOTE — Pre-Procedure Instructions (Signed)
[facility] 2.9 [facility]. She will give this to Glennie IsleLeslie Lewis,PA when she arrives.

## 2018-01-15 NOTE — Telephone Encounter (Signed)
Kim from [employer] called asking if [facility] was in the office. I told her that LSL was still rounding at the hospital. She wanted to let LSL know that the patient's potassium level was 2.9

## 2018-01-15 NOTE — Telephone Encounter (Signed)
Pt notified, will pick meds up and notify pcp.

## 2018-01-15 NOTE — Telephone Encounter (Addendum)
Patient needs KCL BID for 5 days. rx sent  She should address low potassium with PCP. She is on lasix, this can drop potassium if she is taking it regularly.

## 2018-01-15 NOTE — Telephone Encounter (Signed)
Routing message 

## 2018-01-16 NOTE — Telephone Encounter (Signed)
Pt notified, documented in other note 01/15/18

## 2018-01-18 NOTE — Progress Notes (Signed)
Potassium already addressed.  Please let patient know that she has elevated WBC. Would recommend repeating in four weeks.

## 2018-01-20 ENCOUNTER — Ambulatory Visit (HOSPITAL_COMMUNITY): Admission: RE | Admit: 2018-01-20 | Payer: Medicare Other | Source: Ambulatory Visit | Admitting: Internal Medicine

## 2018-01-20 ENCOUNTER — Encounter (HOSPITAL_COMMUNITY): Admission: RE | Payer: Self-pay | Source: Ambulatory Visit

## 2018-01-20 SURGERY — COLONOSCOPY WITH PROPOFOL
Anesthesia: Monitor Anesthesia Care

## 2018-01-21 ENCOUNTER — Other Ambulatory Visit: Payer: Self-pay

## 2018-01-21 DIAGNOSIS — D72829 Elevated white blood cell count, unspecified: Secondary | ICD-10-CM

## 2018-01-21 NOTE — Progress Notes (Signed)
cb

## 2018-02-03 ENCOUNTER — Other Ambulatory Visit: Payer: Self-pay

## 2018-02-03 DIAGNOSIS — D72829 Elevated white blood cell count, unspecified: Secondary | ICD-10-CM

## 2018-02-14 ENCOUNTER — Telehealth: Payer: Self-pay | Admitting: *Deleted

## 2018-02-14 ENCOUNTER — Ambulatory Visit (INDEPENDENT_AMBULATORY_CARE_PROVIDER_SITE_OTHER): Payer: Medicare Other | Admitting: Gastroenterology

## 2018-02-14 ENCOUNTER — Encounter: Payer: Self-pay | Admitting: *Deleted

## 2018-02-14 ENCOUNTER — Other Ambulatory Visit: Payer: Self-pay | Admitting: *Deleted

## 2018-02-14 ENCOUNTER — Encounter: Payer: Self-pay | Admitting: Gastroenterology

## 2018-02-14 VITALS — BP 130/85 | HR 100 | Temp 98.6°F | Ht 71.0 in | Wt 196.2 lb

## 2018-02-14 DIAGNOSIS — E876 Hypokalemia: Secondary | ICD-10-CM

## 2018-02-14 DIAGNOSIS — R1310 Dysphagia, unspecified: Secondary | ICD-10-CM

## 2018-02-14 DIAGNOSIS — D72828 Other elevated white blood cell count: Secondary | ICD-10-CM | POA: Diagnosis not present

## 2018-02-14 DIAGNOSIS — R1013 Epigastric pain: Secondary | ICD-10-CM | POA: Diagnosis not present

## 2018-02-14 DIAGNOSIS — K580 Irritable bowel syndrome with diarrhea: Secondary | ICD-10-CM

## 2018-02-14 DIAGNOSIS — Z1211 Encounter for screening for malignant neoplasm of colon: Secondary | ICD-10-CM

## 2018-02-14 DIAGNOSIS — D72829 Elevated white blood cell count, unspecified: Secondary | ICD-10-CM | POA: Insufficient documentation

## 2018-02-14 LAB — HEPATIC FUNCTION PANEL
AG RATIO: 1.6 (calc) (ref 1.0–2.5)
ALKALINE PHOSPHATASE (APISO): 99 U/L (ref 33–130)
ALT: 27 U/L (ref 6–29)
AST: 19 U/L (ref 10–35)
Albumin: 4.4 g/dL (ref 3.6–5.1)
BILIRUBIN INDIRECT: 0.4 mg/dL (ref 0.2–1.2)
Bilirubin, Direct: 0.1 mg/dL (ref 0.0–0.2)
Globulin: 2.7 g/dL (calc) (ref 1.9–3.7)
TOTAL PROTEIN: 7.1 g/dL (ref 6.1–8.1)
Total Bilirubin: 0.5 mg/dL (ref 0.2–1.2)

## 2018-02-14 LAB — CBC WITH DIFFERENTIAL/PLATELET
BASOS ABS: 36 {cells}/uL (ref 0–200)
Basophils Relative: 0.4 %
Eosinophils Absolute: 137 cells/uL (ref 15–500)
Eosinophils Relative: 1.5 %
HCT: 40 % (ref 35.0–45.0)
Hemoglobin: 13.8 g/dL (ref 11.7–15.5)
Lymphs Abs: 2275 cells/uL (ref 850–3900)
MCH: 29.6 pg (ref 27.0–33.0)
MCHC: 34.5 g/dL (ref 32.0–36.0)
MCV: 85.8 fL (ref 80.0–100.0)
MONOS PCT: 6.5 %
MPV: 9.3 fL (ref 7.5–12.5)
NEUTROS PCT: 66.6 %
Neutro Abs: 6061 cells/uL (ref 1500–7800)
PLATELETS: 343 10*3/uL (ref 140–400)
RBC: 4.66 10*6/uL (ref 3.80–5.10)
RDW: 13 % (ref 11.0–15.0)
TOTAL LYMPHOCYTE: 25 %
WBC mixed population: 592 cells/uL (ref 200–950)
WBC: 9.1 10*3/uL (ref 3.8–10.8)

## 2018-02-14 LAB — BASIC METABOLIC PANEL WITH GFR
BUN: 9 mg/dL (ref 7–25)
CO2: 33 mmol/L — ABNORMAL HIGH (ref 20–32)
Calcium: 9.8 mg/dL (ref 8.6–10.4)
Chloride: 101 mmol/L (ref 98–110)
Creat: 0.95 mg/dL (ref 0.50–1.05)
GFR, Est African American: 80 mL/min/{1.73_m2} (ref >=60)
GFR, Est Non African American: 69 mL/min/{1.73_m2} (ref >=60)
GLUCOSE: 194 mg/dL — AB (ref 65–139)
Potassium: 3.7 mmol/L (ref 3.5–5.3)
SODIUM: 139 mmol/L (ref 135–146)

## 2018-02-14 MED ORDER — DICYCLOMINE HCL 10 MG PO CAPS: 10.0000 mg | ORAL_CAPSULE | Freq: Three times a day (TID) | ORAL | 3 refills | Status: DC

## 2018-02-14 NOTE — Patient Instructions (Signed)
Please have blood work done today. I have also ordered an ultrasound to look at your gallbladder.   I have sent in Bentyl to take 30 minutes before meals and at bedtime. Watch for constipation, dry mouth, dizziness. This is for loose stool and cramping. We may need to try something else in the future.  We have scheduled you for a colonoscopy, upper endoscopy, and dilation in the near future.  I enjoyed our talk today!  It was a pleasure to see you today. I strive to create trusting relationships with patients to provide genuine, compassionate, and quality care. I value your feedback. If you receive a survey regarding your visit,  I greatly appreciate you taking time to fill this out.   Gelene MinkAnna W. Aubrianne Molyneux, PhD, ANP-BC [facility]

## 2018-02-14 NOTE — Assessment & Plan Note (Signed)
Acute on chronic epigastric pain, worsened over past few days. Physical exam with epigastric/RUQ pain. Remains on PPI. Gallbladder in situ. Due to pain with eating, ordering updated US abdomen, HFP along with other labs (CBC, BMP). Plans for EGD at time of colonoscopy due to dysphagia. She has done well with empiric dilation historically.   Proceed with upper endoscopy/dilation in the near future with Dr. Jena Gaussourk. The risks, benefits, and alternatives have been discussed in detail with patient. They have stated understanding and desire to proceed.  Propofol due to polypharmacy US abdomen in near future Labs as ordered

## 2018-02-14 NOTE — Telephone Encounter (Signed)
Per AB regarding Diabetic meds: Victoza 1/2 dose night before procedure, no metformin day of procedure.

## 2018-02-14 NOTE — Assessment & Plan Note (Signed)
52 year old female with history of IBS, now with more diarrhea predominant symptoms. Does not seem consistent with infectious process. Currently without any supportive measures. Will trial Bentyl now. Celiac serologies negative in the past. Last colonoscopy by Dr. Teena DunkBenson in 2013 without polyps, but she reports a remote history of polyps. Actually due for surveillance now.  Proceed with TCS with Dr. Jena Gaussourk in near future: the risks, benefits, and alternatives have been discussed with the patient in detail. The patient states understanding and desires to proceed. Propofol due to polypharmacy Trial of Bentyl

## 2018-02-14 NOTE — Assessment & Plan Note (Signed)
Noted on pre-op labs. Recheck BMP now.

## 2018-02-14 NOTE — Assessment & Plan Note (Signed)
Empirically dilated in 2016 and noted improvement thereafter. EGD/dilation as planned.

## 2018-02-14 NOTE — Assessment & Plan Note (Signed)
Mildly elevated on last CBC, prior checks mildly elevated. Recheck now.

## 2018-02-14 NOTE — Progress Notes (Signed)
Primary Care Physician:  Kirstie Peri, MD Primary GI: Dr. Jena Gauss   Chief Complaint  Patient presents with  . Abdominal Pain    upper area x 3 days, constant    HPI:   [name] is a 52 y.o. female presenting today with a history of GERD, globus sensation, dysphagia, IBS. Personal history of polyps in past with last colonoscopy in 2013 by Dr. Teena Dunk. Last endoscopy 2016 with normal-appearing esophagus s/p empiric dilation. She was seen in Feb 2019 and reporting increased diarrhea, postprandial loose stool, and solid food dysphagia. TCS/EGD/dilation had been arranged, but her potassium was low at 2.9. WBC count 13, but it appears this has been slightly elevated on several prior occasions.    Still with intermittent solid food dysphagia. In the last few days she has noted new onset abdominal pain that is different than her IBS symptoms. Feels like a rock in her epigastric area. Hurts worse after eating. As soon as she eats, has to run to the bathroom. Diarrhea predominant. Sometimes "black" and liquid, sometimes green. No rectal bleeding. Some days will eat and be fine, but for the most part has 3 BMs per day. No NSAIDs. Has noted improvement in empiric dilation historically. Has been on metformin chronically. No recent antibiotics.   Past Medical History:  Diagnosis Date  . Arthritis   . BMI (body mass index) 20.0-29.9 2008 192 lbs  . Complication of anesthesia    post op N/V  . Depression   . Diabetes mellitus   . Fibromyalgia   . GERD (gastroesophageal reflux disease) 2002   202 lbs  . Hypertension   . Irritable bowel syndrome 2008   diarrhea predominant MEDS TRIED: BENTYL, LEVBID  . LBP (low back pain)   . PONV (postoperative nausea and vomiting)   . PTSD (post-traumatic stress disorder)   . RUQ abdominal pain 2004 HIDA NL CTA w/ IVC-FATTY LIVER, ANGIOMYOLIPOMA LEFT KIDNEY   2008 HIDA NL    Past Surgical History:  Procedure Laterality Date  . ANKLE SURGERY  RIGHT  .  BREAST REDUCTION SURGERY  2006  . COLONOSCOPY  2008 RMR   PAN-COLONIC TICS, NL TI  . COLONOSCOPY  2013   Dr. Teena Dunk: diverticulum in ascending colon, mild diverticulosis in sigmoid. Recommended 5 year surveillance  . ESOPHAGEAL DILATION N/A 04/06/2015   Procedure: ESOPHAGEAL DILATION;  Surgeon: Corbin Ade, MD;  Location: [facility];  Service: Gastroenterology;  Laterality: N/A;  300  . ESOPHAGOGASTRODUODENOSCOPY N/A 04/06/2015   RMR: Normal EGD status post Maloney dilation of teh esophagus as described above  . LAPAROSCOPIC BILATERAL SALPINGO OOPHERECTOMY Bilateral 11/22/2015   Procedure: LAPAROSCOPIC BILATERAL SALPINGO OOPHORECTOMY;  Surgeon: Tilda Burrow, MD;  Location: [facility];  Service: Gynecology;  Laterality: Bilateral;  . LUMBAR DISC SURGERY  2005   x2  . SHOULDER SURGERY  1997 RIGHT  . TOTAL ABDOMINAL HYSTERECTOMY  2000 fibroids/DUB  . UPPER GASTROINTESTINAL ENDOSCOPY  2008 RMR   [facility] o/w NL  . vocal cord polyp removal  2013   Dr. Josephina Shih    Current Outpatient Medications  Medication Sig Dispense Refill  . aspirin 81 MG tablet Take 81 mg by mouth daily.     Marland Kitchen atorvastatin (LIPITOR) 10 MG tablet Take 10 mg by mouth daily.     Marland Kitchen buPROPion (WELLBUTRIN XL) 150 MG 24 hr tablet Take 2 tablets (300 mg total) by mouth every morning. (Patient taking differently: Take 150 mg by mouth daily. )  60 tablet 0  . busPIRone (BUSPAR) 10 MG tablet Take 10 mg by mouth 3 (three) times daily.    . Butalbital-APAP-Caffeine (FIORICET) 50-300-40 MG CAPS Take 1 tablet by mouth every 8 (eight) hours as needed (headache).     . Cholecalciferol (VITAMIN D-3) 5000 units TABS Take 5,000 Units by mouth daily.    . COMBIVENT RESPIMAT 20-100 MCG/ACT AERS respimat Inhale 2 puffs into the lungs every 6 (six) hours as needed for wheezing or shortness of breath.     . DEXILANT 60 MG capsule TAKE 1 CAPSULE BY MOUTH ONCE A DAY. 30 capsule 3  . DULoxetine (CYMBALTA) 60 MG capsule Take 1 capsule (60 mg  total) by mouth daily. (Patient taking differently: Take 120 mg by mouth daily. ) 30 capsule 0  . fexofenadine (ALLEGRA) 180 MG tablet Take 180 mg by mouth daily.     . fluticasone (FLONASE) 50 MCG/ACT nasal spray Place 2 sprays into both nostrils daily.    . furosemide (LASIX) 20 MG tablet Take 10 mg by mouth daily as needed for fluid.     Marland Kitchen insulin lispro (HUMALOG) 100 UNIT/ML injection Inject 1-4 Units into the skin 3 (three) times daily as needed for high blood sugar.    . lidocaine (LIDODERM) 5 % Place 1 patch onto the skin daily as needed (pain). Remove & Discard patch within 12 hours or as directed by MD    . Liraglutide (VICTOZA) 18 MG/3ML SOPN Inject 1.8 mg into the skin every evening.     . metFORMIN (GLUCOPHAGE) 500 MG tablet Take 1,000 mg by mouth 2 (two) times daily with a meal.    . metoprolol (LOPRESSOR) 100 MG tablet Take 100 mg by mouth 2 (two) times daily.     . mirtazapine (REMERON) 15 MG tablet Take 15 mg by mouth at bedtime.    . Multiple Vitamin (MULTIVITAMIN) capsule Take 1 capsule by mouth daily.      Marland Kitchen oxyCODONE-acetaminophen (PERCOCET) 10-325 MG tablet Take 1 tablet by mouth 3 (three) times daily as needed for pain.  0  . Potassium Chloride ER 20 MEQ TBCR Take 20 mEq by mouth 2 (two) times daily. 10 tablet 0  . Probiotic Product (PHILLIPS COLON HEALTH PO) Take 1 capsule by mouth daily.    . valsartan-hydrochlorothiazide (DIOVAN-HCT) 160-25 MG per tablet Take 1 tablet by mouth daily.    . VOLTAREN 1 % GEL Apply 2 g topically daily as needed (pain).     Marlowe Kays ER 18 MG C12A Take 18 mg by mouth every 12 (twelve) hours as needed (pain).   0   No current facility-administered medications for this visit.     Allergies as of 02/14/2018 - Review Complete 02/14/2018  Allergen Reaction Noted  . Neurontin [gabapentin] Other (See Comments) 03/24/2013    Family History  Problem Relation Age of Onset  . Breast cancer Maternal Aunt   . Anxiety disorder Maternal Aunt   .  Anxiety disorder Mother   . Stroke Mother   . COPD Mother   . Hypertension Mother   . Thyroid disease Mother   . Diabetes Mother   . Drug abuse Daughter   . Anxiety disorder Daughter   . Seizures Sister   . Alcohol abuse Maternal Uncle   . Mental illness Cousin   . Dementia Maternal Aunt   . Anxiety disorder Sister   . Stroke Sister   . Anxiety disorder Sister   . Drug abuse Sister   . Early  death Sister   . Heart disease Sister   . Heart attack Sister   . Stroke Maternal Grandmother   . Colon cancer Maternal Aunt        in her 1850s  . Pancreatic cancer Maternal Aunt        great aunt  . Colon polyps Neg Hx   . ADD / ADHD Neg Hx   . Bipolar disorder Neg Hx   . Depression Neg Hx   . OCD Neg Hx   . Paranoid behavior Neg Hx   . Schizophrenia Neg Hx   . Sexual abuse Neg Hx   . Physical abuse Neg Hx     Social History   Socioeconomic History  . Marital status: Single    Spouse name: Not on file  . Number of children: 3  . Years of education: 12th  . Highest education level: Not on file  Occupational History    Employer: RETIRED    Comment: [employer]  Social Needs  . Financial resource strain: Not on file  . Food insecurity:    Worry: Not on file    Inability: Not on file  . Transportation needs:    Medical: Not on file    Non-medical: Not on file  Tobacco Use  . Smoking status: Former Smoker    Packs/day: 0.50    Years: 2.00    Pack years: 1.00    Types: Cigarettes    Last attempt to quit: 05/11/2015    Years since quitting: 2.7  . Smokeless tobacco: Never Used  Substance and Sexual Activity  . Alcohol use: No    Alcohol/week: 0.0 oz  . Drug use: No  . Sexual activity: Yes    Birth control/protection: Surgical  Lifestyle  . Physical activity:    Days per week: Not on file    Minutes per session: Not on file  . Stress: Not on file  Relationships  . Social connections:    Talks on phone: Not on file    Gets together: Not on file    Attends  religious service: Not on file    Active member of club or organization: Not on file    Attends meetings of clubs or organizations: Not on file    Relationship status: Not on file  Other Topics Concern  . Not on file  Social History Narrative   Patient lives at home with her family. Raising two grand daughters, daughter passed away 03/20/16   Caffeine Use: 2 cups of coffee daily    Review of Systems: Gen: Denies fever, chills, anorexia. Denies fatigue, weakness, weight loss.  CV: Denies chest pain, palpitations, syncope, peripheral edema, and claudication. Resp: Denies dyspnea at rest, cough, wheezing, coughing up blood, and pleurisy. GI: see HPI  Derm: Denies rash, itching, dry skin Psych: Denies depression, anxiety, memory loss, confusion. No homicidal or suicidal ideation.  Heme: Denies bruising, bleeding, and enlarged lymph nodes.  Physical Exam: BP 130/85   Pulse 100   Temp 98.6 F (37 C) (Oral)   Ht 5\' 11"  (1.803 m)   Wt 196 lb 3.2 oz (89 kg)   BMI 27.36 kg/m  General:   Alert and oriented. No distress noted. Pleasant and cooperative.  Head:  Normocephalic and atraumatic. Eyes:  Conjuctiva clear without scleral icterus. Mouth:  Oral mucosa pink and moist.  Abdomen:  +BS, soft, mild TTP epigastric and RUQ and non-distended. No rebound or guarding. No HSM or masses noted. Msk:  Symmetrical without gross  deformities. Normal posture. Extremities:  Without edema. Neurologic:  Alert and  oriented x4 Psych:  Alert and cooperative. Normal mood and affect.

## 2018-02-17 ENCOUNTER — Telehealth: Payer: Self-pay | Admitting: *Deleted

## 2018-02-17 NOTE — Telephone Encounter (Signed)
Pre-op scheduled for 03/20/18 at 12:45pm. Patient aware. Letter mailed.

## 2018-02-17 NOTE — Progress Notes (Signed)
cc'd to pcp 

## 2018-02-18 NOTE — Progress Notes (Signed)
CBC normal. LFTs unrevealing. Awaiting US abdomen.

## 2018-02-20 ENCOUNTER — Ambulatory Visit (HOSPITAL_COMMUNITY): Payer: Medicare Other

## 2018-02-27 ENCOUNTER — Ambulatory Visit (HOSPITAL_COMMUNITY)
Admission: RE | Admit: 2018-02-27 | Discharge: 2018-02-27 | Disposition: A | Discharge status: Home / Self Care | Payer: Medicare Other | Source: Ambulatory Visit | Attending: Gastroenterology | Admitting: Gastroenterology

## 2018-02-27 DIAGNOSIS — R1013 Epigastric pain: Secondary | ICD-10-CM

## 2018-02-27 DIAGNOSIS — K760 Fatty (change of) liver, not elsewhere classified: Secondary | ICD-10-CM | POA: Diagnosis not present

## 2018-03-04 NOTE — Progress Notes (Signed)
US with fatty liver, no gallstones. Await EGD. IF this is unrevealing, pursue HIDA.

## 2018-03-05 ENCOUNTER — Other Ambulatory Visit: Payer: Self-pay | Admitting: Neurosurgery

## 2018-03-05 DIAGNOSIS — G8929 Other chronic pain: Secondary | ICD-10-CM

## 2018-03-05 DIAGNOSIS — M545 Low back pain: Principal | ICD-10-CM

## 2018-03-10 ENCOUNTER — Encounter: Payer: Self-pay | Admitting: *Deleted

## 2018-03-10 ENCOUNTER — Telehealth: Payer: Self-pay | Admitting: *Deleted

## 2018-03-10 NOTE — Telephone Encounter (Signed)
Pre-op scheduled for 05/05/18 Monday at 10:00am. Letter mailed. LMOVM

## 2018-03-10 NOTE — Telephone Encounter (Signed)
Spoke with pt and procedure date has been moved to 05/12/18 at 12:15 pm. I have mailed new instructions to patient. Will call back with new pre-op date/time

## 2018-03-12 ENCOUNTER — Other Ambulatory Visit: Payer: Self-pay

## 2018-03-20 ENCOUNTER — Other Ambulatory Visit (HOSPITAL_COMMUNITY): Payer: Self-pay

## 2018-04-08 ENCOUNTER — Ambulatory Visit
Admission: RE | Admit: 2018-04-08 | Discharge: 2018-04-08 | Disposition: A | Discharge status: Home / Self Care | Payer: Medicare Other | Source: Ambulatory Visit | Attending: Neurosurgery | Admitting: Neurosurgery

## 2018-04-08 DIAGNOSIS — G8929 Other chronic pain: Secondary | ICD-10-CM

## 2018-04-08 DIAGNOSIS — M545 Low back pain: Principal | ICD-10-CM

## 2018-04-08 MED ORDER — GADOBENATE DIMEGLUMINE 529 MG/ML IV SOLN
20.0000 mL | Freq: Once | INTRAVENOUS | Status: AC | PRN
  Administered 2018-04-08: 20 mL via INTRAVENOUS

## 2018-05-02 ENCOUNTER — Encounter (HOSPITAL_COMMUNITY): Payer: Self-pay

## 2018-05-05 ENCOUNTER — Encounter (HOSPITAL_COMMUNITY)
Admission: RE | Admit: 2018-05-05 | Discharge: 2018-05-05 | Disposition: A | Discharge status: Home / Self Care | Payer: Medicare Other | Source: Ambulatory Visit | Attending: Internal Medicine | Admitting: Internal Medicine

## 2018-05-12 ENCOUNTER — Ambulatory Visit (HOSPITAL_COMMUNITY): Payer: Medicare Other | Admitting: Anesthesiology

## 2018-05-12 ENCOUNTER — Encounter (HOSPITAL_COMMUNITY): Payer: Self-pay | Admitting: Anesthesiology

## 2018-05-12 ENCOUNTER — Encounter (HOSPITAL_COMMUNITY)
Admission: RE | Disposition: A | Discharge status: Home / Self Care | Payer: Self-pay | Source: Ambulatory Visit | Attending: Internal Medicine

## 2018-05-12 ENCOUNTER — Ambulatory Visit (HOSPITAL_COMMUNITY)
Admission: RE | Admit: 2018-05-12 | Discharge: 2018-05-12 | Disposition: A | Discharge status: Home / Self Care | Payer: Medicare Other | Source: Ambulatory Visit | Attending: Internal Medicine | Admitting: Internal Medicine

## 2018-05-12 DIAGNOSIS — I10 Essential (primary) hypertension: Secondary | ICD-10-CM | POA: Diagnosis not present

## 2018-05-12 DIAGNOSIS — R1310 Dysphagia, unspecified: Secondary | ICD-10-CM

## 2018-05-12 DIAGNOSIS — Z7982 Long term (current) use of aspirin: Secondary | ICD-10-CM | POA: Diagnosis not present

## 2018-05-12 DIAGNOSIS — M797 Fibromyalgia: Secondary | ICD-10-CM | POA: Diagnosis not present

## 2018-05-12 DIAGNOSIS — K5730 Diverticulosis of large intestine without perforation or abscess without bleeding: Secondary | ICD-10-CM | POA: Diagnosis not present

## 2018-05-12 DIAGNOSIS — F4310 Post-traumatic stress disorder, unspecified: Secondary | ICD-10-CM | POA: Diagnosis not present

## 2018-05-12 DIAGNOSIS — Z86010 Personal history of colonic polyps: Secondary | ICD-10-CM | POA: Insufficient documentation

## 2018-05-12 DIAGNOSIS — Z87891 Personal history of nicotine dependence: Secondary | ICD-10-CM | POA: Insufficient documentation

## 2018-05-12 DIAGNOSIS — M1990 Unspecified osteoarthritis, unspecified site: Secondary | ICD-10-CM | POA: Diagnosis not present

## 2018-05-12 DIAGNOSIS — E119 Type 2 diabetes mellitus without complications: Secondary | ICD-10-CM | POA: Diagnosis not present

## 2018-05-12 DIAGNOSIS — Z1211 Encounter for screening for malignant neoplasm of colon: Secondary | ICD-10-CM | POA: Diagnosis not present

## 2018-05-12 DIAGNOSIS — Z79899 Other long term (current) drug therapy: Secondary | ICD-10-CM | POA: Diagnosis not present

## 2018-05-12 DIAGNOSIS — Z800 Family history of malignant neoplasm of digestive organs: Secondary | ICD-10-CM | POA: Insufficient documentation

## 2018-05-12 DIAGNOSIS — Z794 Long term (current) use of insulin: Secondary | ICD-10-CM | POA: Insufficient documentation

## 2018-05-12 HISTORY — PX: COLONOSCOPY WITH PROPOFOL: SHX5780

## 2018-05-12 HISTORY — PX: MALONEY DILATION: SHX5535

## 2018-05-12 HISTORY — PX: ESOPHAGOGASTRODUODENOSCOPY (EGD) WITH PROPOFOL: SHX5813

## 2018-05-12 LAB — GLUCOSE, CAPILLARY
GLUCOSE-CAPILLARY: 96 mg/dL (ref 65–99)
Glucose-Capillary: 141 mg/dL — ABNORMAL HIGH (ref 65–99)

## 2018-05-12 SURGERY — COLONOSCOPY WITH PROPOFOL
Anesthesia: Monitor Anesthesia Care

## 2018-05-12 MED ORDER — PROPOFOL 500 MG/50ML IV EMUL
INTRAVENOUS | Status: DC | PRN
  Administered 2018-05-12 (×2): via INTRAVENOUS
  Administered 2018-05-12: 125 ug/kg/min via INTRAVENOUS
  Administered 2018-05-12: 14:00:00 via INTRAVENOUS

## 2018-05-12 MED ORDER — GLYCOPYRROLATE 0.2 MG/ML IJ SOLN
INTRAMUSCULAR | Status: AC
  Filled 2018-05-12: qty 2

## 2018-05-12 MED ORDER — MIDAZOLAM HCL 2 MG/2ML IJ SOLN
INTRAMUSCULAR | Status: AC
  Filled 2018-05-12: qty 2

## 2018-05-12 MED ORDER — LACTATED RINGERS IV SOLN
INTRAVENOUS | Status: DC
  Administered 2018-05-12: 1000 mL via INTRAVENOUS

## 2018-05-12 MED ORDER — PROPOFOL 10 MG/ML IV BOLUS
INTRAVENOUS | Status: AC
  Filled 2018-05-12: qty 40

## 2018-05-12 MED ORDER — GLYCOPYRROLATE 0.2 MG/ML IJ SOLN
INTRAMUSCULAR | Status: DC | PRN
  Administered 2018-05-12: 0.2 mg via INTRAVENOUS

## 2018-05-12 MED ORDER — LIDOCAINE VISCOUS HCL 2 % MT SOLN
6.0000 mL | Freq: Once | OROMUCOSAL | Status: DC
Start: 2018-05-12 — End: 2018-05-12

## 2018-05-12 MED ORDER — PROPOFOL 10 MG/ML IV BOLUS
INTRAVENOUS | Status: AC
  Filled 2018-05-12: qty 20

## 2018-05-12 MED ORDER — LIDOCAINE VISCOUS HCL 2 % MT SOLN
OROMUCOSAL | Status: AC
  Filled 2018-05-12: qty 15

## 2018-05-12 MED ORDER — MIDAZOLAM HCL 5 MG/5ML IJ SOLN
INTRAMUSCULAR | Status: DC | PRN
  Administered 2018-05-12: 2 mg via INTRAVENOUS

## 2018-05-12 MED ORDER — SODIUM CHLORIDE 0.9% FLUSH
INTRAVENOUS | Status: AC
  Filled 2018-05-12: qty 10

## 2018-05-12 NOTE — Op Note (Signed)
[facility] Patient Name: [name] Procedure Date: 05/12/2018 1:53 PM MRN: [id number] Date of Birth: March 12, 1966 Attending MD: Gennette Pac , MD CSN: [id number] Age: 52 Admit Type: Outpatient Procedure:                Colonoscopy Indications:              High risk colon cancer surveillance: Personal                            history of colonic polyps Providers:                Gennette Pac, MD, Jannett Celestine, RN, Nena Polio, RN Referring MD:              Medicines:                Propofol per Anesthesia Complications:            No immediate complications. Estimated Blood Loss:     Estimated blood loss: none. Procedure:                Pre-Anesthesia Assessment:                           - Prior to the procedure, a History and Physical                            was performed, and patient medications and                            allergies were reviewed. The patient's tolerance of                            previous anesthesia was also reviewed. The risks                            and benefits of the procedure and the sedation                            options and risks were discussed with the patient.                            All questions were answered, and informed consent                            was obtained. ASA Grade Assessment: II - A patient                            with mild systemic disease. After reviewing the                            risks and benefits, the patient was deemed in  satisfactory condition to undergo the procedure.                           - Prior to the procedure, a History and Physical                            was performed, and patient medications and                            allergies were reviewed. The patient's tolerance of                            previous anesthesia was also reviewed. The risks                            and benefits of the procedure and the  sedation                            options and risks were discussed with the patient.                            All questions were answered, and informed consent                            was obtained. Prior Anticoagulants: The patient has                            taken no previous anticoagulant or antiplatelet                            agents. ASA Grade Assessment: II - A patient with                            mild systemic disease. After reviewing the risks                            and benefits, the patient was deemed in                            satisfactory condition to undergo the procedure.                           After obtaining informed consent, the colonoscope                            was passed under direct vision. Throughout the                            procedure, the patient's blood pressure, pulse, and                            oxygen saturations were monitored continuously. The  [id number]-[id number] ([id number]) scope was introduced through                            the and advanced to the the cecum, identified by                            appendiceal orifice and ileocecal valve. The                            colonoscopy was performed without difficulty. The                            patient tolerated the procedure well. The quality                            of the bowel preparation was adequate. The                            colonoscopy was performed without difficulty. The                            ileocecal valve, appendiceal orifice, and rectum                            were photographed. The entire colon was well                            visualized. Scope In: 1:58:07 PM Scope Out: 2:21:46 PM Scope Withdrawal Time: 0 hours 8 minutes 37 seconds  Total Procedure Duration: 0 hours 23 minutes 39 seconds  Findings:      The perianal and digital rectal examinations were normal. redundant       colon. Changing of the passure required to  reach the cecum.      A few medium-mouthed diverticula were found in the entire colon.      The exam was otherwise without abnormality on direct and retroflexion       views. Impression:               - Diverticulosis in the entire examined colon.                           - The examination was otherwise normal on direct                            and retroflexion views.                           - No specimens collected. Redundant colon. Moderate Sedation:      Moderate (conscious) sedation was personally administered by an       anesthesia professional. The following parameters were monitored: oxygen       saturation, heart rate, blood pressure, respiratory rate, EKG, adequacy       of pulmonary ventilation, and response to care. Total physician       intraservice time was 53 minutes. Recommendation:           -  Patient has a contact number available for                            emergencies. The signs and symptoms of potential                            delayed complications were discussed with the                            patient. Return to normal activities tomorrow.                            Written discharge instructions were provided to the                            patient.                           - Resume previous diet.                           - Continue present medications.                           - Repeat colonoscopy in 5 years for surveillance.                           - Return to GI office in 2 months. See EGD report. Procedure Code(s):        --- Professional ---                           [phone], Colonoscopy, flexible; diagnostic, including                            collection of specimen(s) by brushing or washing,                            when performed (separate procedure) Diagnosis Code(s):        --- Professional ---                           Z86.010, Personal history of colonic polyps                           K57.30, Diverticulosis of large intestine  without                            perforation or abscess without bleeding CPT copyright 2017 American Medical Association. All rights reserved. The codes documented in this report are preliminary and upon coder review may  be revised to meet current compliance requirements. Gerrit Friendsobert M. Rourk, MD Gennette Pacobert Michael Rourk, MD 05/12/2018 2:32:15 PM This report has been signed electronically. Number of Addenda: 0

## 2018-05-12 NOTE — Anesthesia Preprocedure Evaluation (Signed)
Anesthesia Evaluation  Patient identified by MRN, date of birth, ID band Patient awake    Reviewed: Allergy & Precautions, H&P , NPO status , Patient's Chart, lab work & pertinent test results, reviewed documented beta blocker date and time   History of Anesthesia Complications (+) PONV and history of anesthetic complications  Airway Mallampati: I  TM Distance: >3 FB Neck ROM: full    Dental  (+) Edentulous Upper, Edentulous Lower   Pulmonary neg pulmonary ROS, former smoker,    Pulmonary exam normal breath sounds clear to auscultation       Cardiovascular Exercise Tolerance: Good hypertension, negative cardio ROS  + Valvular Problems/Murmurs  Rhythm:regular Rate:Normal     Neuro/Psych  Headaches, PSYCHIATRIC DISORDERS Anxiety Depression  Neuromuscular disease negative neurological ROS  negative psych ROS   GI/Hepatic negative GI ROS, Neg liver ROS, GERD  ,  Endo/Other  negative endocrine ROSdiabetes  Renal/GU negative Renal ROS  negative genitourinary   Musculoskeletal   Abdominal   Peds  Hematology negative hematology ROS (+)   Anesthesia Other Findings   Reproductive/Obstetrics negative OB ROS                             Anesthesia Physical Anesthesia Plan  ASA: III  Anesthesia Plan: MAC   Post-op Pain Management:    Induction:   PONV Risk Score and Plan:   Airway Management Planned:   Additional Equipment:   Intra-op Plan:   Post-operative Plan:   Informed Consent: I have reviewed the patients History and Physical, chart, labs and discussed the procedure including the risks, benefits and alternatives for the proposed anesthesia with the patient or authorized representative who has indicated his/her understanding and acceptance.   Dental Advisory Given  Plan Discussed with: CRNA  Anesthesia Plan Comments:         Anesthesia Quick Evaluation

## 2018-05-12 NOTE — H&P (Signed)
@LOGO @   Primary Care Physician:  Kirstie Peri, MD Primary Gastroenterologist:  Dr.   Pre-Procedure History & Physical: HPI:  [name] is a 52 y.o. female here for   Past Medical History:  Diagnosis Date  . Arthritis   . BMI (body mass index) 20.0-29.9 2008 192 lbs  . Complication of anesthesia    post op N/V  . Depression   . Diabetes mellitus   . Fibromyalgia   . GERD (gastroesophageal reflux disease) 2002   202 lbs  . Hypertension   . Irritable bowel syndrome 2008   diarrhea predominant MEDS TRIED: BENTYL, LEVBID  . LBP (low back pain)   . PONV (postoperative nausea and vomiting)   . PTSD (post-traumatic stress disorder)   . RUQ abdominal pain 2004 HIDA NL CTA w/ IVC-FATTY LIVER, ANGIOMYOLIPOMA LEFT KIDNEY   2008 HIDA NL    Past Surgical History:  Procedure Laterality Date  . ANKLE SURGERY  RIGHT  . BREAST REDUCTION SURGERY  2006  . COLONOSCOPY  2008 RMR   PAN-COLONIC TICS, NL TI  . COLONOSCOPY  2013   Dr. Teena Dunk: diverticulum in ascending colon, mild diverticulosis in sigmoid. Recommended 5 year surveillance  . ESOPHAGEAL DILATION N/A 04/06/2015   Procedure: ESOPHAGEAL DILATION;  Surgeon: Corbin Ade, MD;  Location: [facility];  Service: Gastroenterology;  Laterality: N/A;  300  . ESOPHAGOGASTRODUODENOSCOPY N/A 04/06/2015   RMR: Normal EGD status post Maloney dilation of teh esophagus as described above  . LAPAROSCOPIC BILATERAL SALPINGO OOPHERECTOMY Bilateral 11/22/2015   Procedure: LAPAROSCOPIC BILATERAL SALPINGO OOPHORECTOMY;  Surgeon: Tilda Burrow, MD;  Location: [facility];  Service: Gynecology;  Laterality: Bilateral;  . LUMBAR DISC SURGERY  2005   x2  . SHOULDER SURGERY  1997 RIGHT  . TOTAL ABDOMINAL HYSTERECTOMY  2000 fibroids/DUB  . UPPER GASTROINTESTINAL ENDOSCOPY  2008 RMR   [facility] o/w NL  . vocal cord polyp removal  2013   Dr. Josephina Shih    Prior to Admission medications   Medication Sig Start Date End Date Taking? Authorizing Provider   aspirin 81 MG tablet Take 81 mg by mouth daily.    Yes [provider]  atorvastatin (LIPITOR) 10 MG tablet Take 10 mg by mouth daily.  09/09/13  Yes [provider]  buPROPion (WELLBUTRIN XL) 150 MG 24 hr tablet Take 150 mg by mouth daily. 04/06/18  Yes [provider]  busPIRone (BUSPAR) 15 MG tablet Take 15 mg by mouth 3 (three) times daily. 02/04/18  Yes [provider]  Cholecalciferol (VITAMIN D-3) 5000 units TABS Take 5,000 Units by mouth daily.   Yes [provider]  COMBIVENT RESPIMAT 20-100 MCG/ACT AERS respimat Inhale 2 puffs into the lungs every 6 (six) hours as needed for wheezing or shortness of breath.  04/24/16  Yes [provider]  DEXILANT 60 MG capsule TAKE 1 CAPSULE BY MOUTH ONCE A DAY. 03/22/17  Yes Tiffany Kocher, PA-C  dicyclomine (BENTYL) 10 MG capsule Take 1 capsule (10 mg total) by mouth 4 (four) times daily -  before meals and at bedtime. 02/14/18  Yes Gelene Mink, NP  DULoxetine (CYMBALTA) 60 MG capsule Take 1 capsule (60 mg total) by mouth daily. 02/04/17 04/30/19 Yes Myrlene Broker, MD  fexofenadine (ALLEGRA) 180 MG tablet Take 180 mg by mouth daily as needed for allergies.    Yes [provider]  fluticasone (FLONASE) 50 MCG/ACT nasal spray Place 2 sprays into both nostrils daily as needed  for allergies.    Yes [provider]  furosemide (LASIX) 20 MG tablet Take 10 mg by mouth daily as needed for fluid.    Yes [provider]  insulin lispro (HUMALOG) 100 UNIT/ML injection Inject 1-4 Units into the skin 3 (three) times daily as needed for high blood sugar (for blood sugars over 150).    Yes [provider]  lidocaine (LIDODERM) 5 % Place 1 patch onto the skin daily as needed (pain). Remove & Discard patch within 12 hours or as directed by MD   Yes [provider]  Liraglutide (VICTOZA) 18 MG/3ML SOPN Inject 1.8 mg into the skin at bedtime.    Yes [provider]   metFORMIN (GLUCOPHAGE) 500 MG tablet Take 1,000 mg by mouth 2 (two) times daily with a meal.   Yes [provider]  metoprolol (LOPRESSOR) 100 MG tablet Take 100 mg by mouth daily.  03/23/11  Yes [provider]  mirtazapine (REMERON) 15 MG tablet Take 15 mg by mouth at bedtime.   Yes [provider]  Multiple Vitamin (MULTIVITAMIN WITH MINERALS) TABS tablet Take 1 tablet by mouth daily.   Yes [provider]  oxyCODONE-acetaminophen (PERCOCET) 10-325 MG tablet Take 1 tablet by mouth every 4 (four) hours as needed for pain.  07/16/17  Yes [provider]  pioglitazone (ACTOS) 15 MG tablet Take 15 mg by mouth daily. 03/27/18  Yes [provider]  Polyethyl Glycol-Propyl Glycol (SYSTANE) 0.4-0.3 % SOLN Place 1-2 drops into both eyes 3 (three) times daily as needed (for dry eyes.).   Yes [provider]  potassium chloride (K-DUR) 10 MEQ tablet Take 10 mEq by mouth daily as needed. For fluid retention with lasix use. 02/28/18  Yes [provider]  prazosin (MINIPRESS) 1 MG capsule Take 1 mg by mouth at bedtime. 04/06/18  Yes [provider]  Probiotic Product (PHILLIPS COLON HEALTH PO) Take 1 capsule by mouth daily.   Yes [provider]  valsartan-hydrochlorothiazide (DIOVAN-HCT) 160-25 MG per tablet Take 1 tablet by mouth daily. 11/02/14  Yes [provider]  VOLTAREN 1 % GEL Apply 2 g topically daily as needed (pain).  10/28/14  Yes [provider]  XTAMPZA ER 18 MG C12A Take 18 mg by mouth every 12 (twelve) hours as needed (pain).  07/16/17  Yes [provider]    Allergies as of 02/14/2018 - Review Complete 02/14/2018  Allergen Reaction Noted  . Neurontin [gabapentin] Other (See Comments) 03/24/2013    Family History  Problem Relation Age of Onset  . Breast cancer Maternal Aunt   . Anxiety disorder Maternal Aunt   . Anxiety disorder Mother   . Stroke Mother   . COPD Mother   .  Hypertension Mother   . Thyroid disease Mother   . Diabetes Mother   . Drug abuse Daughter   . Anxiety disorder Daughter   . Seizures Sister   . Alcohol abuse Maternal Uncle   . Mental illness Cousin   . Dementia Maternal Aunt   . Anxiety disorder Sister   . Stroke Sister   . Anxiety disorder Sister   . Drug abuse Sister   . Early death Sister   . Heart disease Sister   . Heart attack Sister   . Stroke Maternal Grandmother   . Colon cancer Maternal Aunt        in her 26s  . Pancreatic cancer Maternal Aunt        great  aunt  . Colon polyps Neg Hx   . ADD / ADHD Neg Hx   . Bipolar disorder Neg Hx   . Depression Neg Hx   . OCD Neg Hx   . Paranoid behavior Neg Hx   . Schizophrenia Neg Hx   . Sexual abuse Neg Hx   . Physical abuse Neg Hx     Social History   Socioeconomic History  . Marital status: Single    Spouse name: Not on file  . Number of children: 3  . Years of education: 12th  . Highest education level: Not on file  Occupational History    Employer: RETIRED    Comment: [employer]  Social Needs  . Financial resource strain: Not on file  . Food insecurity:    Worry: Not on file    Inability: Not on file  . Transportation needs:    Medical: Not on file    Non-medical: Not on file  Tobacco Use  . Smoking status: Former Smoker    Packs/day: 0.50    Years: 2.00    Pack years: 1.00    Types: Cigarettes    Last attempt to quit: 05/11/2015    Years since quitting: 3.0  . Smokeless tobacco: Never Used  Substance and Sexual Activity  . Alcohol use: No    Alcohol/week: 0.0 oz  . Drug use: No  . Sexual activity: Yes    Birth control/protection: Surgical  Lifestyle  . Physical activity:    Days per week: Not on file    Minutes per session: Not on file  . Stress: Not on file  Relationships  . Social connections:    Talks on phone: Not on file    Gets together: Not on file    Attends religious service: Not on file    Active member of club or  organization: Not on file    Attends meetings of clubs or organizations: Not on file    Relationship status: Not on file  . Intimate partner violence:    Fear of current or ex partner: Not on file    Emotionally abused: Not on file    Physically abused: Not on file    Forced sexual activity: Not on file  Other Topics Concern  . Not on file  Social History Narrative   Patient lives at home with her family. Raising two grand daughters, daughter passed away 2016-04-01   Caffeine Use: 2 cups of coffee daily    Review of Systems: See HPI, otherwise negative ROS  Physical Exam: BP (!) 144/88   Temp 98.3 F (36.8 C) (Oral)   Resp 16   SpO2 95%  General:   Alert,  Well-developed, well-nourished, pleasant and cooperative in NAD Skin:  Intact without significant lesions or rashes. Eyes:  Sclera clear, no icterus.   Conjunctiva pink. Ears:  Normal auditory acuity. Nose:  No deformity, discharge,  or lesions. Mouth:  No deformity or lesions. Neck:  Supple; no masses or thyromegaly. No significant cervical adenopathy. Lungs:  Clear throughout to auscultation.   No wheezes, crackles, or rhonchi. No acute distress. Heart:  Regular rate and rhythm; no murmurs, clicks, rubs,  or gallops. Abdomen: Non-distended, normal bowel sounds.  Soft and nontender without appreciable mass or hepatosplenomegaly.  Pulses:  Normal pulses noted. Extremities:  Without clubbing or edema.  Impression/Plan:  52 year old lady with history of colonic polyps here for surveillance colonoscopy. Chronic diarrhea. Dysphagia and epigastric pain. EGD with esophageal dilation with a colonoscopy  today per plan.  The risks, benefits, limitations, imponderables and alternatives regarding both EGD and colonoscopy have been reviewed with the patient. Questions have been answered. All parties agreeable.      Notice: This dictation was prepared with Dragon dictation along with smaller phrase technology. Any transcriptional errors  that result from this process are unintentional and may not be corrected upon review.

## 2018-05-12 NOTE — Discharge Instructions (Signed)
°Colonoscopy °Discharge Instructions ° °Read the instructions outlined below and refer to this sheet in the next few weeks. These discharge instructions provide you with general information on caring for yourself after you leave the hospital. Your doctor may also give you specific instructions. While your treatment has been planned according to the most current medical practices available, unavoidable complications occasionally occur. If you have any problems or questions after discharge, call Dr. Rourk at [phone]. °ACTIVITY °· You may resume your regular activity, but move at a slower pace for the next 24 hours.  °· Take frequent rest periods for the next 24 hours.  °· Walking will help get rid of the air and reduce the bloated feeling in your belly (abdomen).  °· No driving for 24 hours (because of the medicine (anesthesia) used during the test).   °· Do not sign any important legal documents or operate any machinery for 24 hours (because of the anesthesia used during the test).  °NUTRITION °· Drink plenty of fluids.  °· You may resume your normal diet as instructed by your doctor.  °· Begin with a light meal and progress to your normal diet. Heavy or fried foods are harder to digest and may make you feel sick to your stomach (nauseated).  °· Avoid alcoholic beverages for 24 hours or as instructed.  °MEDICATIONS °· You may resume your normal medications unless your doctor tells you otherwise.  °WHAT YOU CAN EXPECT TODAY °· Some feelings of bloating in the abdomen.  °· Passage of more gas than usual.  °· Spotting of blood in your stool or on the toilet paper.  °IF YOU HAD POLYPS REMOVED DURING THE COLONOSCOPY: °· No aspirin products for 7 days or as instructed.  °· No alcohol for 7 days or as instructed.  °· Eat a soft diet for the next 24 hours.  °FINDING OUT THE RESULTS OF YOUR TEST °Not all test results are available during your visit. If your test results are not back during the visit, make an appointment  with your caregiver to find out the results. Do not assume everything is normal if you have not heard from your caregiver or the medical facility. It is important for you to follow up on all of your test results.  °SEEK IMMEDIATE MEDICAL ATTENTION IF: °· You have more than a spotting of blood in your stool.  °· Your belly is swollen (abdominal distention).  °· You are nauseated or vomiting.  °· You have a temperature over 101.  °· You have abdominal pain or discomfort that is severe or gets worse throughout the day.  °EGD °Discharge instructions °Please read the instructions outlined below and refer to this sheet in the next few weeks. These discharge instructions provide you with general information on caring for yourself after you leave the hospital. Your doctor may also give you specific instructions. While your treatment has been planned according to the most current medical practices available, unavoidable complications occasionally occur. If you have any problems or questions after discharge, please call your doctor. °ACTIVITY °· You may resume your regular activity but move at a slower pace for the next 24 hours.  °· Take frequent rest periods for the next 24 hours.  °· Walking will help expel (get rid of) the air and reduce the bloated feeling in your abdomen.  °· No driving for 24 hours (because of the anesthesia (medicine) used during the test).  °· You may shower.  °· Do not sign any important   legal documents or operate any machinery for 24 hours (because of the anesthesia used during the test).  NUTRITION  Drink plenty of fluids.   You may resume your normal diet.   Begin with a light meal and progress to your normal diet.   Avoid alcoholic beverages for 24 hours or as instructed by your caregiver.  MEDICATIONS  You may resume your normal medications unless your caregiver tells you otherwise.  WHAT YOU CAN EXPECT TODAY  You may experience abdominal discomfort such as a feeling of fullness  or gas pains.  FOLLOW-UP  Your doctor will discuss the results of your test with you.  SEEK IMMEDIATE MEDICAL ATTENTION IF ANY OF THE FOLLOWING OCCUR:  Excessive nausea (feeling sick to your stomach) and/or vomiting.   Severe abdominal pain and distention (swelling).   Trouble swallowing.   Temperature over 101 F (37.8 C).   Rectal bleeding or vomiting of blood.    Colon Diverticulosis information provided  Repeat colonoscopy in 5 years  Office visit with us in 2 months    Diverticulosis Diverticulosis is a condition that develops when small pouches (diverticula) form in the wall of the large intestine (colon). The colon is where water is absorbed and stool is formed. The pouches form when the inside layer of the colon pushes through weak spots in the outer layers of the colon. You may have a few pouches or many of them. What are the causes? The cause of this condition is not known. What increases the risk? The following factors may make you more likely to develop this condition:  Being older than age 52. Your risk for this condition increases with age. Diverticulosis is rare among people younger than age 52. By age 52, many people have it.  Eating a low-fiber diet.  Having frequent constipation.  Being overweight.  Not getting enough exercise.  Smoking.  Taking over-the-counter pain medicines, like aspirin and ibuprofen.  Having a family history of diverticulosis.  What are the signs or symptoms? In most people, there are no symptoms of this condition. If you do have symptoms, they may include:  Bloating.  Cramps in the abdomen.  Constipation or diarrhea.  Pain in the lower left side of the abdomen.  How is this diagnosed? This condition is most often diagnosed during an exam for other colon problems. Because diverticulosis usually has no symptoms, it often cannot be diagnosed independently. This condition may be diagnosed by:  Using a flexible scope  to examine the colon (colonoscopy).  Taking an X-ray of the colon after dye has been put into the colon (barium enema).  Doing a CT scan.  How is this treated? You may not need treatment for this condition if you have never developed an infection related to diverticulosis. If you have had an infection before, treatment may include:  Eating a high-fiber diet. This may include eating more fruits, vegetables, and grains.  Taking a fiber supplement.  Taking a live bacteria supplement (probiotic).  Taking medicine to relax your colon.  Taking antibiotic medicines.  Follow these instructions at home:  Drink 6-8 glasses of water or more each day to prevent constipation.  Try not to strain when you have a bowel movement.  If you have had an infection before: ? Eat more fiber as directed by your health care provider or your diet and nutrition specialist (dietitian). ? Take a fiber supplement or probiotic, if your health care provider approves.  Take over-the-counter and prescription medicines only as  told by your health care provider.  If you were prescribed an antibiotic, take it as told by your health care provider. Do not stop taking the antibiotic even if you start to feel better.  Keep all follow-up visits as told by your health care provider. This is important. Contact a health care provider if:  You have pain in your abdomen.  You have bloating.  You have cramps.  You have not had a bowel movement in 3 days. Get help right away if:  Your pain gets worse.  Your bloating becomes very bad.  You have a fever or chills, and your symptoms suddenly get worse.  You vomit.  You have bowel movements that are bloody or black.  You have bleeding from your rectum. Summary  Diverticulosis is a condition that develops when small pouches (diverticula) form in the wall of the large intestine (colon).  You may have a few pouches or many of them.  This condition is most often  diagnosed during an exam for other colon problems.  If you have had an infection related to diverticulosis, treatment may include increasing the fiber in your diet, taking supplements, or taking medicines. This information is not intended to replace advice given to you by your health care provider. Make sure you discuss any questions you have with your health care provider. Document Released: 08/02/2004 Document Revised: 09/24/2016 Document Reviewed: 09/24/2016 Elsevier Interactive Patient Education  2017 [employer].

## 2018-05-12 NOTE — Op Note (Signed)
[facility] Patient Name: [name] Procedure Date: 05/12/2018 1:24 PM MRN: [id number] Date of Birth: [birth year] Attending MD: Gennette Pac , MD CSN: [id number] Age: 52 Admit Type: Outpatient Procedure:                Upper GI endoscopy Indications:              Dysphagia Providers:                Gennette Pac, MD, Jannett Celestine, RN, Nena Polio, RN Referring MD:             Carmelina Peal Sherryll Burger MD, MD Medicines:                Propofol per Anesthesia Complications:            No immediate complications. Estimated Blood Loss:     Estimated blood loss: none. Procedure:                Pre-Anesthesia Assessment:                           - Prior to the procedure, a History and Physical                            was performed, and patient medications and                            allergies were reviewed. The patient's tolerance of                            previous anesthesia was also reviewed. The risks                            and benefits of the procedure and the sedation                            options and risks were discussed with the patient.                            All questions were answered, and informed consent                            was obtained. Prior Anticoagulants: The patient has                            taken no previous anticoagulant or antiplatelet                            agents. ASA Grade Assessment: II - A patient with                            mild systemic disease. After reviewing the risks  and benefits, the patient was deemed in                            satisfactory condition to undergo the procedure.                           After obtaining informed consent, the endoscope was                            passed under direct vision. Throughout the                            procedure, the patient's blood pressure, pulse, and                            oxygen saturations were  monitored continuously. The                            EG-[id number] ([id number]) scope was introduced through the                            and advanced to the second part of duodenum. The                            upper GI endoscopy was accomplished without                            difficulty. The patient tolerated the procedure                            well. Scope In: 1:44:04 PM Scope Out: 1:50:51 PM Total Procedure Duration: 0 hours 6 minutes 47 seconds  Findings:      The examined esophagus was normal.      The entire examined stomach was normal.      The duodenal bulb and second portion of the duodenum were normal. The       scope was withdrawn. Dilation was performed with a Maloney dilator with       mild resistance at 54 Fr. The dilation site was examined following       endoscope reinsertion and showed no change. Estimated blood loss: none. Impression:               - Normal esophagus. Dilated.                           - Normal stomach.                           - Normal duodenal bulb and second portion of the                            duodenum.                           - No specimens collected. Moderate Sedation:      Moderate (conscious) sedation was personally administered by  an       anesthesia professional. The following parameters were monitored: oxygen       saturation, heart rate, blood pressure, respiratory rate, EKG, adequacy       of pulmonary ventilation, and response to care. Total physician       intraservice time was 22 minutes. Recommendation:           - Patient has a contact number available for                            emergencies. The signs and symptoms of potential                            delayed complications were discussed with the                            patient. Return to normal activities tomorrow.                            Written discharge instructions were provided to the                            patient.                           -  Resume previous diet. See colonoscopy report. Procedure Code(s):        --- Professional ---                           [id number], Esophagogastroduodenoscopy, flexible,                            transoral; diagnostic, including collection of                            specimen(s) by brushing or washing, when performed                            (separate procedure)                           43450, Dilation of esophagus, by unguided sound or                            bougie, single or multiple passes Diagnosis Code(s):        --- Professional ---                           R13.10, Dysphagia, unspecified CPT copyright 2017 American Medical Association. All rights reserved. The codes documented in this report are preliminary and upon coder review may  be revised to meet current compliance requirements. Gerrit Friendsobert M. Dajae Kizer, MD Gennette Pacobert Michael Johonna Binette, MD 05/12/2018 1:55:52 PM This report has been signed electronically. Number of Addenda: 0

## 2018-05-12 NOTE — Anesthesia Postprocedure Evaluation (Signed)
Anesthesia Post Note  Patient: [name]  Procedure(s) Performed: COLONOSCOPY WITH PROPOFOL (N/A ) ESOPHAGOGASTRODUODENOSCOPY (EGD) WITH PROPOFOL (N/A ) MALONEY DILATION (N/A )  Patient location during evaluation: PACU Anesthesia Type: MAC Level of consciousness: awake and alert and patient cooperative Pain management: pain level controlled Vital Signs Assessment: post-procedure vital signs reviewed and stable Respiratory status: spontaneous breathing, nonlabored ventilation and respiratory function stable Cardiovascular status: blood pressure returned to baseline Postop Assessment: no apparent nausea or vomiting Anesthetic complications: no     Last Vitals:  Vitals:   05/12/18 1141 05/12/18 1145  BP: (!) 144/88 (!) 144/88  Resp: 18 16  Temp: 36.8 C   SpO2: 95% 95%    Last Pain:  Vitals:   05/12/18 1141  TempSrc: Oral  PainSc: 0-No pain                 Kennia Vanvorst J

## 2018-05-12 NOTE — Transfer of Care (Signed)
Immediate Anesthesia Transfer of Care Note  Patient: [name]  Procedure(s) Performed: COLONOSCOPY WITH PROPOFOL (N/A ) ESOPHAGOGASTRODUODENOSCOPY (EGD) WITH PROPOFOL (N/A ) MALONEY DILATION (N/A )  Patient Location: PACU  Anesthesia Type:MAC  Level of Consciousness: awake and patient cooperative  Airway & Oxygen Therapy: Patient Spontanous Breathing and Patient connected to nasal cannula oxygen  Post-op Assessment: Report given to RN and Post -op Vital signs reviewed and stable  Post vital signs: Reviewed and stable  Last Vitals:  Vitals Value Taken Time  BP    Temp    Pulse 42 05/12/2018  2:33 PM  Resp 21 05/12/2018  2:33 PM  SpO2 86 % 05/12/2018  2:33 PM  Vitals shown include unvalidated device data.  Last Pain:  Vitals:   05/12/18 1141  TempSrc: Oral  PainSc: 0-No pain      Patients Stated Pain Goal: 6 (14/70/92 9574)  Complications: No apparent anesthesia complications

## 2018-05-12 NOTE — Interval H&P Note (Signed)
History and Physical Interval Note:  05/12/2018 2:39 PM  [name]  has presented today for surgery, with the diagnosis of screening colonoscopy, dysphagia  The various methods of treatment have been discussed with the patient and family. After consideration of risks, benefits and other options for treatment, the patient has consented to  Procedure(s) with comments: COLONOSCOPY WITH PROPOFOL (N/A) - 12:15pm ESOPHAGOGASTRODUODENOSCOPY (EGD) WITH PROPOFOL (N/A) MALONEY DILATION (N/A) as a surgical intervention .  The patient's history has been reviewed, patient examined, no change in status, stable for surgery.  I have reviewed the patient's chart and labs.  Questions were answered to the patient's satisfaction.     [name]

## 2018-05-14 ENCOUNTER — Encounter (HOSPITAL_COMMUNITY): Payer: Self-pay | Admitting: Internal Medicine

## 2018-07-17 ENCOUNTER — Other Ambulatory Visit: Payer: Self-pay | Admitting: Neurosurgery

## 2018-07-31 ENCOUNTER — Other Ambulatory Visit: Payer: Self-pay | Admitting: Neurosurgery

## 2018-08-04 ENCOUNTER — Other Ambulatory Visit: Payer: Self-pay | Admitting: Gastroenterology

## 2018-08-06 ENCOUNTER — Encounter: Payer: Self-pay | Admitting: Gastroenterology

## 2018-08-06 ENCOUNTER — Ambulatory Visit: Payer: Medicare Other | Admitting: Gastroenterology

## 2018-08-06 ENCOUNTER — Telehealth: Payer: Self-pay | Admitting: Gastroenterology

## 2018-08-06 NOTE — Telephone Encounter (Signed)
PATIENT WAS A NO SHOW AND LETTER SENT  °

## 2018-08-12 NOTE — Pre-Procedure Instructions (Signed)
[name]  08/12/2018      [id number] - [place], [place] - [address] AT [facility] [address] [place] [address] Phone: [phone] Fax: [phone]    Your procedure is scheduled on August 21, 2018.  Report to [facility] Admitting at 945 AM.  Call this number if you have problems the morning of surgery:  [phone]   Remember:  Do not eat or drink after midnight.     Take these medicines the morning of surgery with A SIP OF WATER  Metoprolol (lopressor) wellbutrin (bupropion) Buspirone (buspar) Combivent inhaler-if needed Dexilant Duloxetine (cymbalta) Flonase nasal spray-If needed Fexofenadine (allegra) Methocarbamol (robaxin)-if needed Oxycodone-acetaminophen (percocet)-if needed for pain Eye drops-if needed  Follow your surgeon's instructions on when to hold/resume aspirin.  If no instructions were given call the office to determine how they would like to you take aspirin  7 days prior to surgery STOP taking any Voltaren gel, Aleve, Naproxen, Ibuprofen, Motrin, Advil, Goody's, BC's, all herbal medications, fish oil, and all vitamins   WHAT DO I DO ABOUT MY DIABETES MEDICATION?  Marland Kitchen Do not take oral diabetes medicines (pills) the morning of surgery-Metformin (glucophage), pioglitazone (actos)  . THE NIGHT BEFORE SURGERY, take your normal dose of humalog insulin with dinner.       . THE MORNING OF SURGERY, take no humolog insulin unless your CBG is greater than 220 mg/dL, then you may take  of your sliding scale (correction) dose of insulin.  . The day of surgery, do not take other diabetes injectables, including Byetta (exenatide), Bydureon (exenatide ER), Victoza (liraglutide), or Trulicity (dulaglutide).  . If your CBG is greater than 220 mg/dL, you may take  of your sliding scale (correction) dose of insulin.  Reviewed and Endorsed by [facility] Patient Education Committee,  August 2015  How to Manage Your Diabetes Before and After Surgery  Why is it important to control my blood sugar before and after surgery? . Improving blood sugar levels before and after surgery helps healing and can limit problems. . A way of improving blood sugar control is eating a healthy diet by: o  Eating less sugar and carbohydrates o  Increasing activity/exercise o  Talking with your doctor about reaching your blood sugar goals . High blood sugars (greater than 180 mg/dL) can raise your risk of infections and slow your recovery, so you will need to focus on controlling your diabetes during the weeks before surgery. . Make sure that the doctor who takes care of your diabetes knows about your planned surgery including the date and location.  How do I manage my blood sugar before surgery? . Check your blood sugar at least 4 times a day, starting 2 days before surgery, to make sure that the level is not too high or low. o Check your blood sugar the morning of your surgery when you wake up and every 2 hours until you get to the Short Stay unit. . If your blood sugar is less than 70 mg/dL, you will need to treat for low blood sugar: o Do not take insulin. o Treat a low blood sugar (less than 70 mg/dL) with  cup of clear juice (cranberry or apple), 4 glucose tablets, OR glucose gel. Recheck blood sugar in 15 minutes after treatment (to make sure it is greater than 70 mg/dL). If your blood sugar is not greater than 70 mg/dL  on recheck, call [phone] o  for further instructions. . Report your blood sugar to the short stay nurse when you get to Short Stay.  . If you are admitted to the hospital after surgery: o Your blood sugar will be checked by the staff and you will probably be given insulin after surgery (instead of oral diabetes medicines) to make sure you have good blood sugar levels. o The goal for blood sugar control after surgery is 80-180 mg/dL.  [place]- Preparing For  Surgery  Before surgery, you can play an important role. Because skin is not sterile, your skin needs to be as free of germs as possible. You can reduce the number of germs on your skin by washing with CHG (chlorahexidine gluconate) Soap before surgery.  CHG is an antiseptic cleaner which kills germs and bonds with the skin to continue killing germs even after washing.    Oral Hygiene is also important to reduce your risk of infection.  Remember - BRUSH YOUR TEETH THE MORNING OF SURGERY WITH YOUR REGULAR TOOTHPASTE  Please do not use if you have an allergy to CHG or antibacterial soaps. If your skin becomes reddened/irritated stop using the CHG.  Do not shave (including legs and underarms) for at least 48 hours prior to first CHG shower. It is OK to shave your face.  Please follow these instructions carefully.   1. Shower the NIGHT BEFORE SURGERY and the MORNING OF SURGERY with CHG.   2. If you chose to wash your hair, wash your hair first as usual with your normal shampoo.  3. After you shampoo, rinse your hair and body thoroughly to remove the shampoo.  4. Use CHG as you would any other liquid soap. You can apply CHG directly to the skin and wash gently with a scrungie or a clean washcloth.   5. Apply the CHG Soap to your body ONLY FROM THE NECK DOWN.  Do not use on open wounds or open sores. Avoid contact with your eyes, ears, mouth and genitals (private parts). Wash Face and genitals (private parts)  with your normal soap.  6. Wash thoroughly, paying special attention to the area where your surgery will be performed.  7. Thoroughly rinse your body with warm water from the neck down.  8. DO NOT shower/wash with your normal soap after using and rinsing off the CHG Soap.  9. Pat yourself dry with a CLEAN TOWEL.  10. Wear CLEAN PAJAMAS to bed the night before surgery, wear comfortable clothes the morning of surgery  11. Place CLEAN SHEETS on your bed the night of your first shower and  DO NOT SLEEP WITH PETS   Day of Surgery:  Do not apply any deodorants/lotions.  Please wear clean clothes to the hospital/surgery center.   Remember to brush your teeth WITH YOUR REGULAR TOOTHPASTE.               Do not wear jewelry, make-up or nail polish.  Do not wear lotions, powders, or perfumes, or deodorant.  Do not shave 48 hours prior to surgery.    Do not bring valuables to the hospital.  [facility] is not responsible for any belongings or valuables.  Contacts, dentures or bridgework may not be worn into surgery.  Leave your suitcase in the car.  After surgery it may be brought to your room.  For patients admitted to the hospital, discharge time will be determined by your treatment team.  Patients discharged the day of surgery  will not be allowed to drive home.   Please read over the following fact sheets that you were given.

## 2018-08-13 ENCOUNTER — Other Ambulatory Visit: Payer: Self-pay

## 2018-08-13 ENCOUNTER — Encounter (HOSPITAL_COMMUNITY): Payer: Self-pay

## 2018-08-13 ENCOUNTER — Encounter (HOSPITAL_COMMUNITY)
Admission: RE | Admit: 2018-08-13 | Discharge: 2018-08-13 | Disposition: A | Discharge status: Home / Self Care | Payer: Medicare Other | Source: Ambulatory Visit | Attending: Neurosurgery | Admitting: Neurosurgery

## 2018-08-13 DIAGNOSIS — Z79899 Other long term (current) drug therapy: Secondary | ICD-10-CM | POA: Insufficient documentation

## 2018-08-13 DIAGNOSIS — Z01818 Encounter for other preprocedural examination: Secondary | ICD-10-CM | POA: Insufficient documentation

## 2018-08-13 DIAGNOSIS — Z794 Long term (current) use of insulin: Secondary | ICD-10-CM | POA: Diagnosis not present

## 2018-08-13 DIAGNOSIS — Z7982 Long term (current) use of aspirin: Secondary | ICD-10-CM | POA: Insufficient documentation

## 2018-08-13 DIAGNOSIS — E119 Type 2 diabetes mellitus without complications: Secondary | ICD-10-CM | POA: Diagnosis not present

## 2018-08-13 DIAGNOSIS — M5126 Other intervertebral disc displacement, lumbar region: Secondary | ICD-10-CM | POA: Insufficient documentation

## 2018-08-13 LAB — TYPE AND SCREEN
ABO/RH(D): A POS
Antibody Screen: NEGATIVE

## 2018-08-13 LAB — BASIC METABOLIC PANEL
ANION GAP: 13 (ref 5–15)
BUN: 8 mg/dL (ref 6–20)
CALCIUM: 9.6 mg/dL (ref 8.9–10.3)
CO2: 28 mmol/L (ref 22–32)
Chloride: 100 mmol/L (ref 98–111)
Creatinine, Ser: 0.81 mg/dL (ref 0.44–1.00)
GFR calc Af Amer: >60 mL/min (ref >60)
GLUCOSE: 137 mg/dL — AB (ref 70–99)
POTASSIUM: 3.5 mmol/L (ref 3.5–5.1)
Sodium: 141 mmol/L (ref 135–145)

## 2018-08-13 LAB — CBC
HEMATOCRIT: 43.9 % (ref 36.0–46.0)
HEMOGLOBIN: 14.4 g/dL (ref 12.0–15.0)
MCH: 30.4 pg (ref 26.0–34.0)
MCHC: 32.8 g/dL (ref 30.0–36.0)
MCV: 92.6 fL (ref 78.0–100.0)
Platelets: 309 10*3/uL (ref 150–400)
RBC: 4.74 MIL/uL (ref 3.87–5.11)
RDW: 12.7 % (ref 11.5–15.5)
WBC: 8.1 10*3/uL (ref 4.0–10.5)

## 2018-08-13 LAB — HEMOGLOBIN A1C
HEMOGLOBIN A1C: 7.1 % — AB (ref 4.8–5.6)
MEAN PLASMA GLUCOSE: 157.07 mg/dL

## 2018-08-13 LAB — SURGICAL PCR SCREEN
MRSA, PCR: NEGATIVE
STAPHYLOCOCCUS AUREUS: NEGATIVE

## 2018-08-13 LAB — ABO/RH: ABO/RH(D): A POS

## 2018-08-13 NOTE — Progress Notes (Addendum)
PCP: Kirstie Peri, MD  Cardiologist: pt denies  EKG: 01/14/18 in EPIC  Stress test: pt denies  ECHO:05/2015 in EPIC  Cardiac Cath: pt denies  Chest x-ray: pt denies past year, no recent respiratory infections

## 2018-08-14 NOTE — Progress Notes (Signed)
Anesthesia Chart Review:  Case:  [id number] Date/Time:  08/21/18 1134   Procedure:  POSTERIOR LUMBAR INTERBODY FUSION, INTERBODY PROSTHESIS, POSTERIOR INSTRUMENTATION/FUSION LUMBAR 4- LUMBAR 5 (N/A ) - POSTERIOR LUMBAR INTERBODY FUSION, INTERBODY PROSTHESIS, POSTERIOR INSTRUMENTATION/FUSION LUMBAR 4- LUMBAR 5   Anesthesia type:  General   Pre-op diagnosis:  RECURRENT HERNIATION OF LUMBAR DISC   Location:  [facility] OR ROOM 19 / [facility] OR   Surgeon:  Tressie Stalker, MD      DISCUSSION: 52 yo female former smoker for above procedure. Pertinent hx includes PONV, IDDM, PTSD, Fibromyalgia, Depression, GERD, HTN.  Pt was seen by Dr. Wyline Mood in 2016 for eval of atypical chest pain. Echo 2016 showed EF 65-70%, normal wall motion, grade 1dd, mild MR, trivial TR. Myocardial perfusion scan 2016 showed no ST segment deviation, low risk.  Anticipate she can proceed as planned barring acute status change.  VS: BP (!) 140/94   Pulse 86   Temp 36.8 C   Resp 20   Ht 5\' 11"  (1.803 m)   Wt 95.2 kg   SpO2 100%   BMI 29.26 kg/m   PROVIDERS: Kirstie Peri, MD is PCP   LABS: Labs reviewed: Acceptable for surgery. (all labs ordered are listed, but only abnormal results are displayed)  Labs Reviewed  BASIC METABOLIC PANEL - Abnormal; Notable for the following components:      Result Value   Glucose, Bld 137 (*)    All other components within normal limits  HEMOGLOBIN A1C - Abnormal; Notable for the following components:   Hgb A1c MFr Bld 7.1 (*)    All other components within normal limits  SURGICAL PCR SCREEN  CBC  TYPE AND SCREEN  ABO/RH     IMAGES: CHEST  2 VIEW 12/24/2014  COMPARISON:  05/25/2014  FINDINGS: The heart size and mediastinal contours are within normal limits. Both lungs are clear. The visualized skeletal structures are unremarkable.  IMPRESSION: No active cardiopulmonary disease.  EKG: 01/14/2018: Sinus tachycardia 109bpm. Septal infarct , age undetermined  CV: TTE  06/02/2015: Study Conclusions  - Left ventricle: The cavity size was normal. Systolic function was   vigorous. The estimated ejection fraction was in the range of 65%   to 70%. Wall motion was normal; there were no regional wall   motion abnormalities. Doppler parameters are consistent with   abnormal left ventricular relaxation (grade 1 diastolic   dysfunction). Very mild septal thickening (1 cm). - Mitral valve: Mildly thickened leaflets . There was no systolic   anterior motion. There was mild regurgitation. - Left atrium: The atrium was mildly dilated. - Tricuspid valve: There was trivial regurgitation.  Myocardial perfusion scan 06/13/2015:  There was no ST segment deviation noted during stress.  The study is normal.  This is a low risk study.  The left ventricular ejection fraction is normal (55-65%).  Past Medical History:  Diagnosis Date  . Arthritis   . BMI (body mass index) 20.0-29.9 2008 192 lbs  . Complication of anesthesia    post op N/V  . Depression   . Diabetes mellitus   . Fibromyalgia   . GERD (gastroesophageal reflux disease) 2002   202 lbs  . Hypertension   . Irritable bowel syndrome 2008   diarrhea predominant MEDS TRIED: BENTYL, LEVBID  . LBP (low back pain)   . PONV (postoperative nausea and vomiting)   . PTSD (post-traumatic stress disorder)   . RUQ abdominal pain 2004 HIDA NL CTA w/ IVC-FATTY LIVER, ANGIOMYOLIPOMA LEFT KIDNEY  2008 HIDA NL    Past Surgical History:  Procedure Laterality Date  . ANKLE SURGERY  RIGHT  . BREAST REDUCTION SURGERY  2006  . COLONOSCOPY  2008 RMR   PAN-COLONIC TICS, NL TI  . COLONOSCOPY  2013   Dr. Teena Dunk: diverticulum in ascending colon, mild diverticulosis in sigmoid. Recommended 5 year surveillance  . COLONOSCOPY WITH PROPOFOL N/A 05/12/2018   Procedure: COLONOSCOPY WITH PROPOFOL;  Surgeon: Corbin Ade, MD;  [facility]: [facility];  Service: Endoscopy;  Laterality: N/A;  12:15pm  . ESOPHAGEAL DILATION  N/A 04/06/2015   Procedure: ESOPHAGEAL DILATION;  Surgeon: Corbin Ade, MD;  Location: [facility];  Service: Gastroenterology;  Laterality: N/A;  300  . ESOPHAGOGASTRODUODENOSCOPY N/A 04/06/2015   RMR: Normal EGD status post Maloney dilation of teh esophagus as described above  . ESOPHAGOGASTRODUODENOSCOPY (EGD) WITH PROPOFOL N/A 05/12/2018   Procedure: ESOPHAGOGASTRODUODENOSCOPY (EGD) WITH PROPOFOL;  Surgeon: Corbin Ade, MD;  [facility]: [facility];  Service: Endoscopy;  Laterality: N/A;  . LAPAROSCOPIC BILATERAL SALPINGO OOPHERECTOMY Bilateral 11/22/2015   Procedure: LAPAROSCOPIC BILATERAL SALPINGO OOPHORECTOMY;  Surgeon: Tilda Burrow, MD;  Location: [facility] ORS;  Service: Gynecology;  Laterality: Bilateral;  . LUMBAR DISC SURGERY  2005   x2  . MALONEY DILATION N/A 05/12/2018   Procedure: Elease Hashimoto DILATION;  Surgeon: Corbin Ade, MD;  Location: [facility];  Service: Endoscopy;  Laterality: N/A;  . SHOULDER SURGERY  1997 RIGHT  . TOTAL ABDOMINAL HYSTERECTOMY  2000 fibroids/DUB  . UPPER GASTROINTESTINAL ENDOSCOPY  2008 RMR   SML HH o/w NL  . vocal cord polyp removal  2013   Dr. Josephina Shih    MEDICATIONS: . aspirin 81 MG tablet  . atorvastatin (LIPITOR) 10 MG tablet  . buPROPion (WELLBUTRIN XL) 150 MG 24 hr tablet  . busPIRone (BUSPAR) 15 MG tablet  . Cholecalciferol (VITAMIN D-3) 5000 units TABS  . COMBIVENT RESPIMAT 20-100 MCG/ACT AERS respimat  . DEXILANT 60 MG capsule  . dicyclomine (BENTYL) 10 MG capsule  . DULoxetine (CYMBALTA) 60 MG capsule  . fexofenadine (ALLEGRA) 180 MG tablet  . fluticasone (FLONASE) 50 MCG/ACT nasal spray  . furosemide (LASIX) 20 MG tablet  . insulin lispro (HUMALOG) 100 UNIT/ML KiwkPen  . lidocaine (LIDODERM) 5 %  . Liraglutide (VICTOZA) 18 MG/3ML SOPN  . metFORMIN (GLUCOPHAGE) 500 MG tablet  . methocarbamol (ROBAXIN) 500 MG tablet  . metoprolol (LOPRESSOR) 100 MG tablet  . mirtazapine (REMERON) 15 MG tablet  . Multiple Vitamin  (MULTIVITAMIN WITH MINERALS) TABS tablet  . oxyCODONE-acetaminophen (PERCOCET) 10-325 MG tablet  . pioglitazone (ACTOS) 15 MG tablet  . Polyethyl Glycol-Propyl Glycol (SYSTANE) 0.4-0.3 % SOLN  . potassium chloride (K-DUR) 10 MEQ tablet  . prazosin (MINIPRESS) 1 MG capsule  . Probiotic Product (PHILLIPS COLON HEALTH PO)  . valsartan-hydrochlorothiazide (DIOVAN-HCT) 160-25 MG per tablet  . VOLTAREN 1 % GEL  . XTAMPZA ER 18 MG C12A   No current facility-administered medications for this encounter.      Zannie Cove [facility] Short Stay Center/Anesthesiology Phone [phone] 08/14/2018 1:39 PM'

## 2018-08-21 ENCOUNTER — Other Ambulatory Visit: Payer: Self-pay

## 2018-08-21 ENCOUNTER — Inpatient Hospital Stay (HOSPITAL_COMMUNITY): Payer: Medicare Other

## 2018-08-21 ENCOUNTER — Encounter (HOSPITAL_COMMUNITY)
Admission: RE | Disposition: A | Discharge status: Home / Self Care | Payer: Self-pay | Source: Home / Self Care | Attending: Neurosurgery

## 2018-08-21 ENCOUNTER — Inpatient Hospital Stay (HOSPITAL_COMMUNITY)
Admission: RE | Admit: 2018-08-21 | Discharge: 2018-08-23 | DRG: 455 | Disposition: A | Discharge status: Home / Self Care | Payer: Medicare Other | Attending: Neurosurgery | Admitting: Neurosurgery

## 2018-08-21 ENCOUNTER — Encounter (HOSPITAL_COMMUNITY): Payer: Self-pay | Admitting: Anesthesiology

## 2018-08-21 ENCOUNTER — Inpatient Hospital Stay (HOSPITAL_COMMUNITY): Payer: Medicare Other | Admitting: Anesthesiology

## 2018-08-21 ENCOUNTER — Inpatient Hospital Stay (HOSPITAL_COMMUNITY): Payer: Medicare Other | Admitting: Physician Assistant

## 2018-08-21 DIAGNOSIS — K589 Irritable bowel syndrome without diarrhea: Secondary | ICD-10-CM | POA: Diagnosis not present

## 2018-08-21 DIAGNOSIS — Z833 Family history of diabetes mellitus: Secondary | ICD-10-CM | POA: Diagnosis not present

## 2018-08-21 DIAGNOSIS — Z7982 Long term (current) use of aspirin: Secondary | ICD-10-CM | POA: Diagnosis not present

## 2018-08-21 DIAGNOSIS — M48061 Spinal stenosis, lumbar region without neurogenic claudication: Secondary | ICD-10-CM | POA: Diagnosis present

## 2018-08-21 DIAGNOSIS — Z823 Family history of stroke: Secondary | ICD-10-CM | POA: Diagnosis not present

## 2018-08-21 DIAGNOSIS — Z825 Family history of asthma and other chronic lower respiratory diseases: Secondary | ICD-10-CM | POA: Diagnosis not present

## 2018-08-21 DIAGNOSIS — Z794 Long term (current) use of insulin: Secondary | ICD-10-CM | POA: Diagnosis not present

## 2018-08-21 DIAGNOSIS — E119 Type 2 diabetes mellitus without complications: Secondary | ICD-10-CM | POA: Diagnosis present

## 2018-08-21 DIAGNOSIS — M5116 Intervertebral disc disorders with radiculopathy, lumbar region: Secondary | ICD-10-CM | POA: Diagnosis present

## 2018-08-21 DIAGNOSIS — Z419 Encounter for procedure for purposes other than remedying health state, unspecified: Secondary | ICD-10-CM

## 2018-08-21 DIAGNOSIS — Z8249 Family history of ischemic heart disease and other diseases of the circulatory system: Secondary | ICD-10-CM | POA: Diagnosis not present

## 2018-08-21 DIAGNOSIS — Z800 Family history of malignant neoplasm of digestive organs: Secondary | ICD-10-CM | POA: Diagnosis not present

## 2018-08-21 DIAGNOSIS — M4726 Other spondylosis with radiculopathy, lumbar region: Secondary | ICD-10-CM | POA: Diagnosis present

## 2018-08-21 DIAGNOSIS — I10 Essential (primary) hypertension: Secondary | ICD-10-CM | POA: Diagnosis present

## 2018-08-21 DIAGNOSIS — F329 Major depressive disorder, single episode, unspecified: Secondary | ICD-10-CM | POA: Diagnosis present

## 2018-08-21 DIAGNOSIS — M62838 Other muscle spasm: Secondary | ICD-10-CM | POA: Diagnosis present

## 2018-08-21 DIAGNOSIS — Z87891 Personal history of nicotine dependence: Secondary | ICD-10-CM | POA: Diagnosis not present

## 2018-08-21 DIAGNOSIS — K219 Gastro-esophageal reflux disease without esophagitis: Secondary | ICD-10-CM | POA: Diagnosis present

## 2018-08-21 DIAGNOSIS — M5126 Other intervertebral disc displacement, lumbar region: Secondary | ICD-10-CM | POA: Diagnosis present

## 2018-08-21 DIAGNOSIS — Z8349 Family history of other endocrine, nutritional and metabolic diseases: Secondary | ICD-10-CM | POA: Diagnosis not present

## 2018-08-21 LAB — GLUCOSE, CAPILLARY
GLUCOSE-CAPILLARY: 203 mg/dL — AB (ref 70–99)
Glucose-Capillary: 155 mg/dL — ABNORMAL HIGH (ref 70–99)
Glucose-Capillary: 218 mg/dL — ABNORMAL HIGH (ref 70–99)

## 2018-08-21 SURGERY — POSTERIOR LUMBAR FUSION 1 LEVEL
Anesthesia: General

## 2018-08-21 MED ORDER — IPRATROPIUM-ALBUTEROL 20-100 MCG/ACT IN AERS: 2.0000 | INHALATION_SPRAY | Freq: Four times a day (QID) | RESPIRATORY_TRACT | Status: DC | PRN

## 2018-08-21 MED ORDER — MIDAZOLAM HCL 5 MG/5ML IJ SOLN
INTRAMUSCULAR | Status: DC | PRN
  Administered 2018-08-21: 2 mg via INTRAVENOUS

## 2018-08-21 MED ORDER — POLYVINYL ALCOHOL 1.4 % OP SOLN: 1.0000 [drp] | OPHTHALMIC | Status: DC | PRN

## 2018-08-21 MED ORDER — FENTANYL CITRATE (PF) 100 MCG/2ML IJ SOLN
INTRAMUSCULAR | Status: DC | PRN
  Administered 2018-08-21 (×2): 50 ug via INTRAVENOUS
  Administered 2018-08-21: 150 ug via INTRAVENOUS

## 2018-08-21 MED ORDER — ONDANSETRON HCL 4 MG PO TABS: 4.0000 mg | ORAL_TABLET | Freq: Four times a day (QID) | ORAL | Status: DC | PRN

## 2018-08-21 MED ORDER — PROPOFOL 10 MG/ML IV BOLUS
INTRAVENOUS | Status: AC
  Filled 2018-08-21: qty 20

## 2018-08-21 MED ORDER — HYDROCHLOROTHIAZIDE 25 MG PO TABS
25.0000 mg | ORAL_TABLET | Freq: Every day | ORAL | Status: DC
  Administered 2018-08-22: 25 mg via ORAL
  Filled 2018-08-21: qty 1

## 2018-08-21 MED ORDER — MENTHOL 3 MG MT LOZG: 1.0000 | LOZENGE | OROMUCOSAL | Status: DC | PRN

## 2018-08-21 MED ORDER — INSULIN ASPART 100 UNIT/ML ~~LOC~~ SOLN
0.0000 [IU] | Freq: Three times a day (TID) | SUBCUTANEOUS | Status: DC
  Administered 2018-08-22: 4 [IU] via SUBCUTANEOUS
  Administered 2018-08-22: 7 [IU] via SUBCUTANEOUS
  Administered 2018-08-22: 4 [IU] via SUBCUTANEOUS
  Administered 2018-08-23: 3 [IU] via SUBCUTANEOUS

## 2018-08-21 MED ORDER — ACETAMINOPHEN 325 MG PO TABS: 650.0000 mg | ORAL_TABLET | ORAL | Status: DC | PRN

## 2018-08-21 MED ORDER — BACITRACIN ZINC 500 UNIT/GM EX OINT
TOPICAL_OINTMENT | CUTANEOUS | Status: AC
  Filled 2018-08-21: qty 28.35

## 2018-08-21 MED ORDER — HYDROMORPHONE HCL 1 MG/ML IJ SOLN
INTRAMUSCULAR | Status: AC
  Filled 2018-08-21: qty 1

## 2018-08-21 MED ORDER — BACITRACIN ZINC 500 UNIT/GM EX OINT
TOPICAL_OINTMENT | CUTANEOUS | Status: DC | PRN
  Administered 2018-08-21: 1 via TOPICAL

## 2018-08-21 MED ORDER — BUPIVACAINE LIPOSOME 1.3 % IJ SUSP
INTRAMUSCULAR | Status: DC | PRN
  Administered 2018-08-21: 20 mL

## 2018-08-21 MED ORDER — LACTATED RINGERS IV SOLN
INTRAVENOUS | Status: DC
  Administered 2018-08-21 (×2): via INTRAVENOUS

## 2018-08-21 MED ORDER — ROCURONIUM BROMIDE 50 MG/5ML IV SOSY
PREFILLED_SYRINGE | INTRAVENOUS | Status: AC
  Filled 2018-08-21: qty 5

## 2018-08-21 MED ORDER — POTASSIUM CHLORIDE ER 10 MEQ PO TBCR
10.0000 meq | EXTENDED_RELEASE_TABLET | Freq: Every day | ORAL | Status: DC | PRN
  Filled 2018-08-21: qty 1

## 2018-08-21 MED ORDER — CHLORHEXIDINE GLUCONATE CLOTH 2 % EX PADS: 6.0000 | MEDICATED_PAD | Freq: Once | CUTANEOUS | Status: DC

## 2018-08-21 MED ORDER — BUPIVACAINE-EPINEPHRINE (PF) 0.5% -1:200000 IJ SOLN
INTRAMUSCULAR | Status: DC | PRN
  Administered 2018-08-21: 10 mL

## 2018-08-21 MED ORDER — CEFAZOLIN SODIUM-DEXTROSE 2-4 GM/100ML-% IV SOLN
2.0000 g | INTRAVENOUS | Status: AC
  Administered 2018-08-21: 2 g via INTRAVENOUS

## 2018-08-21 MED ORDER — ONDANSETRON HCL 4 MG/2ML IJ SOLN
INTRAMUSCULAR | Status: AC
  Filled 2018-08-21: qty 2

## 2018-08-21 MED ORDER — ROCURONIUM BROMIDE 10 MG/ML (PF) SYRINGE
PREFILLED_SYRINGE | INTRAVENOUS | Status: DC | PRN
  Administered 2018-08-21: 20 mg via INTRAVENOUS
  Administered 2018-08-21: 50 mg via INTRAVENOUS

## 2018-08-21 MED ORDER — OXYCODONE-ACETAMINOPHEN 10-325 MG PO TABS: 1.0000 | ORAL_TABLET | ORAL | Status: DC | PRN

## 2018-08-21 MED ORDER — PANTOPRAZOLE SODIUM 40 MG PO TBEC
40.0000 mg | DELAYED_RELEASE_TABLET | Freq: Every day | ORAL | Status: DC
  Administered 2018-08-22: 40 mg via ORAL
  Filled 2018-08-21: qty 1

## 2018-08-21 MED ORDER — SODIUM CHLORIDE 0.9 % IV SOLN
INTRAVENOUS | Status: DC | PRN
  Administered 2018-08-21: 13:00:00

## 2018-08-21 MED ORDER — THROMBIN 5000 UNITS EX SOLR
CUTANEOUS | Status: AC
  Filled 2018-08-21: qty 5000

## 2018-08-21 MED ORDER — PHENOL 1.4 % MT LIQD: 1.0000 | OROMUCOSAL | Status: DC | PRN

## 2018-08-21 MED ORDER — DEXAMETHASONE SODIUM PHOSPHATE 10 MG/ML IJ SOLN
INTRAMUSCULAR | Status: AC
  Filled 2018-08-21: qty 1

## 2018-08-21 MED ORDER — MORPHINE SULFATE (PF) 4 MG/ML IV SOLN
4.0000 mg | INTRAVENOUS | Status: DC | PRN
  Administered 2018-08-21: 4 mg via INTRAVENOUS
  Filled 2018-08-21: qty 1

## 2018-08-21 MED ORDER — BUPIVACAINE LIPOSOME 1.3 % IJ SUSP
20.0000 mL | INTRAMUSCULAR | Status: DC
  Filled 2018-08-21: qty 20

## 2018-08-21 MED ORDER — ACETAMINOPHEN 500 MG PO TABS
1000.0000 mg | ORAL_TABLET | Freq: Four times a day (QID) | ORAL | Status: AC
  Administered 2018-08-21 – 2018-08-22 (×4): 1000 mg via ORAL
  Filled 2018-08-21 (×4): qty 2

## 2018-08-21 MED ORDER — MIRTAZAPINE 15 MG PO TABS
15.0000 mg | ORAL_TABLET | Freq: Every day | ORAL | Status: DC
  Filled 2018-08-21: qty 1

## 2018-08-21 MED ORDER — VITAMIN D 1000 UNITS PO TABS
5000.0000 [IU] | ORAL_TABLET | Freq: Every day | ORAL | Status: DC
  Filled 2018-08-21 (×2): qty 5

## 2018-08-21 MED ORDER — THROMBIN 5000 UNITS EX SOLR
OROMUCOSAL | Status: DC | PRN
  Administered 2018-08-21: 13:00:00 via TOPICAL

## 2018-08-21 MED ORDER — METOPROLOL TARTRATE 100 MG PO TABS
100.0000 mg | ORAL_TABLET | Freq: Every day | ORAL | Status: DC
  Administered 2018-08-22: 100 mg via ORAL
  Filled 2018-08-21: qty 1

## 2018-08-21 MED ORDER — OXYCODONE-ACETAMINOPHEN 5-325 MG PO TABS
1.0000 | ORAL_TABLET | ORAL | Status: DC | PRN
  Administered 2018-08-22: 1 via ORAL
  Filled 2018-08-21: qty 1

## 2018-08-21 MED ORDER — PIOGLITAZONE HCL 15 MG PO TABS
15.0000 mg | ORAL_TABLET | Freq: Every day | ORAL | Status: DC
  Administered 2018-08-22: 15 mg via ORAL
  Filled 2018-08-21 (×2): qty 1

## 2018-08-21 MED ORDER — INSULIN ASPART 100 UNIT/ML ~~LOC~~ SOLN
0.0000 [IU] | Freq: Every day | SUBCUTANEOUS | Status: DC
  Administered 2018-08-21: 2 [IU] via SUBCUTANEOUS

## 2018-08-21 MED ORDER — MIDAZOLAM HCL 2 MG/2ML IJ SOLN
INTRAMUSCULAR | Status: AC
  Filled 2018-08-21: qty 2

## 2018-08-21 MED ORDER — 0.9 % SODIUM CHLORIDE (POUR BTL) OPTIME
TOPICAL | Status: DC | PRN
  Administered 2018-08-21: 1000 mL

## 2018-08-21 MED ORDER — METFORMIN HCL 500 MG PO TABS
1000.0000 mg | ORAL_TABLET | Freq: Two times a day (BID) | ORAL | Status: DC
  Administered 2018-08-21 – 2018-08-23 (×4): 1000 mg via ORAL
  Filled 2018-08-21 (×4): qty 2

## 2018-08-21 MED ORDER — VANCOMYCIN HCL 1000 MG IV SOLR
INTRAVENOUS | Status: AC
  Filled 2018-08-21: qty 1000

## 2018-08-21 MED ORDER — VANCOMYCIN HCL 1 G IV SOLR
INTRAVENOUS | Status: DC | PRN
  Administered 2018-08-21: 1000 mg via TOPICAL

## 2018-08-21 MED ORDER — HYDROMORPHONE HCL 1 MG/ML IJ SOLN
0.2500 mg | INTRAMUSCULAR | Status: DC | PRN
  Administered 2018-08-21 (×2): 0.5 mg via INTRAVENOUS

## 2018-08-21 MED ORDER — DOCUSATE SODIUM 100 MG PO CAPS
100.0000 mg | ORAL_CAPSULE | Freq: Two times a day (BID) | ORAL | Status: DC
  Administered 2018-08-21 – 2018-08-22 (×2): 100 mg via ORAL
  Filled 2018-08-21 (×3): qty 1

## 2018-08-21 MED ORDER — DULOXETINE HCL 30 MG PO CPEP
60.0000 mg | ORAL_CAPSULE | Freq: Every day | ORAL | Status: DC
  Administered 2018-08-22: 60 mg via ORAL
  Filled 2018-08-21: qty 2

## 2018-08-21 MED ORDER — SODIUM CHLORIDE 0.9% FLUSH: 3.0000 mL | INTRAVENOUS | Status: DC | PRN

## 2018-08-21 MED ORDER — ONDANSETRON HCL 4 MG/2ML IJ SOLN: 4.0000 mg | Freq: Four times a day (QID) | INTRAMUSCULAR | Status: DC | PRN

## 2018-08-21 MED ORDER — MEPERIDINE HCL 50 MG/ML IJ SOLN: 6.2500 mg | INTRAMUSCULAR | Status: DC | PRN

## 2018-08-21 MED ORDER — BUPIVACAINE-EPINEPHRINE 0.5% -1:200000 IJ SOLN
INTRAMUSCULAR | Status: AC
  Filled 2018-08-21: qty 1

## 2018-08-21 MED ORDER — KETAMINE HCL 10 MG/ML IJ SOLN
INTRAMUSCULAR | Status: DC | PRN
  Administered 2018-08-21: 30 mg via INTRAVENOUS
  Administered 2018-08-21: 20 mg via INTRAVENOUS

## 2018-08-21 MED ORDER — PRAZOSIN HCL 1 MG PO CAPS
1.0000 mg | ORAL_CAPSULE | Freq: Every day | ORAL | Status: DC
  Administered 2018-08-21 – 2018-08-22 (×2): 1 mg via ORAL
  Filled 2018-08-21 (×2): qty 1

## 2018-08-21 MED ORDER — ACETAMINOPHEN 650 MG RE SUPP: 650.0000 mg | RECTAL | Status: DC | PRN

## 2018-08-21 MED ORDER — ADULT MULTIVITAMIN W/MINERALS CH
1.0000 | ORAL_TABLET | Freq: Every day | ORAL | Status: DC
  Administered 2018-08-22: 1 via ORAL
  Filled 2018-08-21: qty 1

## 2018-08-21 MED ORDER — PROMETHAZINE HCL 25 MG/ML IJ SOLN: 6.2500 mg | INTRAMUSCULAR | Status: DC | PRN

## 2018-08-21 MED ORDER — POLYETHYL GLYCOL-PROPYL GLYCOL 0.4-0.3 % OP SOLN: 1.0000 [drp] | Freq: Three times a day (TID) | OPHTHALMIC | Status: DC | PRN

## 2018-08-21 MED ORDER — CEFAZOLIN SODIUM-DEXTROSE 2-4 GM/100ML-% IV SOLN
2.0000 g | Freq: Three times a day (TID) | INTRAVENOUS | Status: AC
  Administered 2018-08-21 – 2018-08-22 (×2): 2 g via INTRAVENOUS
  Filled 2018-08-21 (×2): qty 100

## 2018-08-21 MED ORDER — ZOLPIDEM TARTRATE 5 MG PO TABS: 5.0000 mg | ORAL_TABLET | Freq: Every evening | ORAL | Status: DC | PRN

## 2018-08-21 MED ORDER — DICYCLOMINE HCL 10 MG PO CAPS
10.0000 mg | ORAL_CAPSULE | Freq: Three times a day (TID) | ORAL | Status: DC
  Administered 2018-08-21 – 2018-08-23 (×6): 10 mg via ORAL
  Filled 2018-08-21 (×6): qty 1

## 2018-08-21 MED ORDER — INSULIN ASPART 100 UNIT/ML ~~LOC~~ SOLN: 0.0000 [IU] | SUBCUTANEOUS | Status: DC

## 2018-08-21 MED ORDER — LACTATED RINGERS IV SOLN: INTRAVENOUS | Status: DC

## 2018-08-21 MED ORDER — LIRAGLUTIDE 18 MG/3ML ~~LOC~~ SOPN: 1.8000 mg | PEN_INJECTOR | Freq: Every day | SUBCUTANEOUS | Status: DC

## 2018-08-21 MED ORDER — BUPROPION HCL ER (XL) 150 MG PO TB24: 150.0000 mg | ORAL_TABLET | Freq: Every day | ORAL | Status: DC

## 2018-08-21 MED ORDER — BISACODYL 10 MG RE SUPP: 10.0000 mg | Freq: Every day | RECTAL | Status: DC | PRN

## 2018-08-21 MED ORDER — KETAMINE HCL 50 MG/5ML IJ SOSY
PREFILLED_SYRINGE | INTRAMUSCULAR | Status: AC
  Filled 2018-08-21: qty 5

## 2018-08-21 MED ORDER — FENTANYL CITRATE (PF) 250 MCG/5ML IJ SOLN
INTRAMUSCULAR | Status: AC
  Filled 2018-08-21: qty 5

## 2018-08-21 MED ORDER — CYCLOBENZAPRINE HCL 10 MG PO TABS
10.0000 mg | ORAL_TABLET | Freq: Three times a day (TID) | ORAL | Status: DC | PRN
  Administered 2018-08-21 – 2018-08-23 (×3): 10 mg via ORAL
  Filled 2018-08-21 (×3): qty 1

## 2018-08-21 MED ORDER — DEXAMETHASONE SODIUM PHOSPHATE 10 MG/ML IJ SOLN
INTRAMUSCULAR | Status: DC | PRN
  Administered 2018-08-21: 5 mg via INTRAVENOUS

## 2018-08-21 MED ORDER — ACETAMINOPHEN 10 MG/ML IV SOLN
1000.0000 mg | INTRAVENOUS | Status: AC
  Administered 2018-08-21: 1000 mg via INTRAVENOUS
  Filled 2018-08-21: qty 100

## 2018-08-21 MED ORDER — IRBESARTAN 150 MG PO TABS
150.0000 mg | ORAL_TABLET | Freq: Every day | ORAL | Status: DC
  Administered 2018-08-22: 150 mg via ORAL
  Filled 2018-08-21 (×2): qty 1

## 2018-08-21 MED ORDER — INSULIN ASPART 100 UNIT/ML ~~LOC~~ SOLN: 0.0000 [IU] | Freq: Three times a day (TID) | SUBCUTANEOUS | Status: DC

## 2018-08-21 MED ORDER — ATORVASTATIN CALCIUM 20 MG PO TABS
10.0000 mg | ORAL_TABLET | Freq: Every day | ORAL | Status: DC
  Administered 2018-08-21 – 2018-08-22 (×2): 10 mg via ORAL
  Filled 2018-08-21 (×2): qty 1

## 2018-08-21 MED ORDER — THROMBIN 20000 UNITS EX KIT
PACK | CUTANEOUS | Status: AC
  Filled 2018-08-21: qty 1

## 2018-08-21 MED ORDER — OXYCODONE HCL 5 MG PO TABS
5.0000 mg | ORAL_TABLET | ORAL | Status: DC | PRN
  Administered 2018-08-22: 5 mg via ORAL
  Filled 2018-08-21: qty 1

## 2018-08-21 MED ORDER — LIDOCAINE 2% (20 MG/ML) 5 ML SYRINGE
INTRAMUSCULAR | Status: AC
  Filled 2018-08-21: qty 5

## 2018-08-21 MED ORDER — THROMBIN 20000 UNITS EX SOLR
CUTANEOUS | Status: DC | PRN
  Administered 2018-08-21: 13:00:00 via TOPICAL

## 2018-08-21 MED ORDER — FLUTICASONE PROPIONATE 50 MCG/ACT NA SUSP: 2.0000 | Freq: Every day | NASAL | Status: DC | PRN

## 2018-08-21 MED ORDER — ONDANSETRON HCL 4 MG/2ML IJ SOLN
INTRAMUSCULAR | Status: DC | PRN
  Administered 2018-08-21: 4 mg via INTRAVENOUS

## 2018-08-21 MED ORDER — LORATADINE 10 MG PO TABS: 10.0000 mg | ORAL_TABLET | Freq: Every day | ORAL | Status: DC | PRN

## 2018-08-21 MED ORDER — SUGAMMADEX SODIUM 200 MG/2ML IV SOLN
INTRAVENOUS | Status: DC | PRN
  Administered 2018-08-21: 200 mg via INTRAVENOUS

## 2018-08-21 MED ORDER — OXYCODONE HCL 5 MG PO TABS
10.0000 mg | ORAL_TABLET | ORAL | Status: DC | PRN
  Administered 2018-08-21 – 2018-08-23 (×12): 10 mg via ORAL
  Filled 2018-08-21 (×12): qty 2

## 2018-08-21 MED ORDER — PROPOFOL 10 MG/ML IV BOLUS
INTRAVENOUS | Status: DC | PRN
  Administered 2018-08-21: 130 mg via INTRAVENOUS

## 2018-08-21 MED ORDER — BUSPIRONE HCL 15 MG PO TABS
15.0000 mg | ORAL_TABLET | Freq: Three times a day (TID) | ORAL | Status: DC
  Administered 2018-08-21 – 2018-08-22 (×4): 15 mg via ORAL
  Filled 2018-08-21 (×5): qty 1

## 2018-08-21 MED ORDER — LIDOCAINE 2% (20 MG/ML) 5 ML SYRINGE
INTRAMUSCULAR | Status: DC | PRN
  Administered 2018-08-21: 60 mg via INTRAVENOUS

## 2018-08-21 MED ORDER — FUROSEMIDE 20 MG PO TABS: 10.0000 mg | ORAL_TABLET | Freq: Every day | ORAL | Status: DC | PRN

## 2018-08-21 MED ORDER — SODIUM CHLORIDE 0.9% FLUSH: 3.0000 mL | Freq: Two times a day (BID) | INTRAVENOUS | Status: DC

## 2018-08-21 MED ORDER — VALSARTAN-HYDROCHLOROTHIAZIDE 160-25 MG PO TABS: 1.0000 | ORAL_TABLET | Freq: Every day | ORAL | Status: DC

## 2018-08-21 SURGICAL SUPPLY — 73 items
BAG DECANTER FOR FLEXI CONT (MISCELLANEOUS) ×3 IMPLANT
BENZOIN TINCTURE PRP APPL 2/3 (GAUZE/BANDAGES/DRESSINGS) ×3 IMPLANT
BLADE CLIPPER SURG (BLADE) IMPLANT
BUR MATCHSTICK NEURO 3.0 LAGG (BURR) ×3 IMPLANT
BUR PRECISION FLUTE 6.0 (BURR) ×3 IMPLANT
CAGE ALTERA 10X31MM-10-14-15 (Cage) ×1 IMPLANT
CAGE ALTERA 10X31X10-14 15D (Cage) ×2 IMPLANT
CANISTER SUCT 3000ML PPV (MISCELLANEOUS) ×3 IMPLANT
CAP REVERE LOCKING (Cap) ×12 IMPLANT
CARTRIDGE OIL MAESTRO DRILL (MISCELLANEOUS) ×1 IMPLANT
CLOSURE WOUND 1/2 X4 (GAUZE/BANDAGES/DRESSINGS) ×1
CONT SPEC 4OZ CLIKSEAL STRL BL (MISCELLANEOUS) ×3 IMPLANT
COVER BACK TABLE 60X90IN (DRAPES) ×3 IMPLANT
COVER WAND RF STERILE (DRAPES) IMPLANT
DECANTER SPIKE VIAL GLASS SM (MISCELLANEOUS) ×3 IMPLANT
DIFFUSER DRILL AIR PNEUMATIC (MISCELLANEOUS) ×3 IMPLANT
DRAPE C-ARM 42X72 X-RAY (DRAPES) ×6 IMPLANT
DRAPE HALF SHEET 40X57 (DRAPES) ×3 IMPLANT
DRAPE LAPAROTOMY 100X72X124 (DRAPES) ×3 IMPLANT
DRAPE SURG 17X23 STRL (DRAPES) ×12 IMPLANT
DRSG OPSITE POSTOP 4X6 (GAUZE/BANDAGES/DRESSINGS) ×3 IMPLANT
ELECT BLADE 4.0 EZ CLEAN MEGAD (MISCELLANEOUS) ×3
ELECT REM PT RETURN 9FT ADLT (ELECTROSURGICAL) ×3
ELECTRODE BLDE 4.0 EZ CLN MEGD (MISCELLANEOUS) ×1 IMPLANT
ELECTRODE REM PT RTRN 9FT ADLT (ELECTROSURGICAL) ×1 IMPLANT
EVACUATOR 1/8 PVC DRAIN (DRAIN) IMPLANT
GAUZE 4X4 16PLY RFD (DISPOSABLE) ×3 IMPLANT
GAUZE SPONGE 4X4 12PLY STRL (GAUZE/BANDAGES/DRESSINGS) ×3 IMPLANT
GLOVE BIO SURGEON STRL SZ 6.5 (GLOVE) ×2 IMPLANT
GLOVE BIO SURGEON STRL SZ7.5 (GLOVE) ×3 IMPLANT
GLOVE BIO SURGEON STRL SZ8 (GLOVE) ×6 IMPLANT
GLOVE BIO SURGEON STRL SZ8.5 (GLOVE) ×6 IMPLANT
GLOVE BIO SURGEONS STRL SZ 6.5 (GLOVE) ×1
GLOVE BIOGEL PI IND STRL 6.5 (GLOVE) ×1 IMPLANT
GLOVE BIOGEL PI IND STRL 7.5 (GLOVE) ×1 IMPLANT
GLOVE BIOGEL PI INDICATOR 6.5 (GLOVE) ×2
GLOVE BIOGEL PI INDICATOR 7.5 (GLOVE) ×2
GLOVE EXAM NITRILE LRG STRL (GLOVE) IMPLANT
GLOVE EXAM NITRILE XL STR (GLOVE) IMPLANT
GLOVE EXAM NITRILE XS STR PU (GLOVE) IMPLANT
GLOVE SURG SS PI 7.5 STRL IVOR (GLOVE) ×12 IMPLANT
GOWN STRL REUS W/ TWL LRG LVL3 (GOWN DISPOSABLE) ×3 IMPLANT
GOWN STRL REUS W/ TWL XL LVL3 (GOWN DISPOSABLE) ×2 IMPLANT
GOWN STRL REUS W/TWL 2XL LVL3 (GOWN DISPOSABLE) IMPLANT
GOWN STRL REUS W/TWL LRG LVL3 (GOWN DISPOSABLE) ×6
GOWN STRL REUS W/TWL XL LVL3 (GOWN DISPOSABLE) ×4
HEMOSTAT POWDER KIT SURGIFOAM (HEMOSTASIS) ×3 IMPLANT
KIT BASIN OR (CUSTOM PROCEDURE TRAY) ×3 IMPLANT
KIT TURNOVER KIT B (KITS) ×3 IMPLANT
MILL MEDIUM DISP (BLADE) ×3 IMPLANT
NEEDLE HYPO 21X1.5 SAFETY (NEEDLE) ×3 IMPLANT
NEEDLE HYPO 22GX1.5 SAFETY (NEEDLE) ×3 IMPLANT
NS IRRIG 1000ML POUR BTL (IV SOLUTION) ×3 IMPLANT
OIL CARTRIDGE MAESTRO DRILL (MISCELLANEOUS) ×3
PACK LAMINECTOMY NEURO (CUSTOM PROCEDURE TRAY) ×3 IMPLANT
PAD ARMBOARD 7.5X6 YLW CONV (MISCELLANEOUS) ×9 IMPLANT
PATTIES SURGICAL .5 X1 (DISPOSABLE) IMPLANT
ROD REVERE 6.35 45MM (Rod) ×6 IMPLANT
SCREW REVERE 6.35 7.5X40 (Screw) ×12 IMPLANT
SPONGE LAP 4X18 RFD (DISPOSABLE) IMPLANT
SPONGE NEURO XRAY DETECT 1X3 (DISPOSABLE) IMPLANT
SPONGE SURGIFOAM ABS GEL 100 (HEMOSTASIS) ×3 IMPLANT
STRIP BIOACTIVE 20CC 25X100X8 (Miscellaneous) ×3 IMPLANT
STRIP CLOSURE SKIN 1/2X4 (GAUZE/BANDAGES/DRESSINGS) ×2 IMPLANT
SUT VIC AB 1 CT1 18XBRD ANBCTR (SUTURE) ×2 IMPLANT
SUT VIC AB 1 CT1 8-18 (SUTURE) ×4
SUT VIC AB 2-0 CP2 18 (SUTURE) ×6 IMPLANT
SYR 20CC LL (SYRINGE) ×3 IMPLANT
TOWEL GREEN STERILE (TOWEL DISPOSABLE) ×3 IMPLANT
TOWEL GREEN STERILE FF (TOWEL DISPOSABLE) ×3 IMPLANT
TRAY FOLEY CATH 14FR (SET/KITS/TRAYS/PACK) ×3 IMPLANT
TRAY FOLEY MTR SLVR 16FR STAT (SET/KITS/TRAYS/PACK) IMPLANT
WATER STERILE IRR 1000ML POUR (IV SOLUTION) ×3 IMPLANT

## 2018-08-21 NOTE — Anesthesia Procedure Notes (Signed)
Procedure Name: Intubation Date/Time: 08/21/2018 12:00 PM Performed by: Lovie Chol, CRNA Pre-anesthesia Checklist: Patient identified, Emergency Drugs available, Suction available and Patient being monitored Patient Re-evaluated:Patient Re-evaluated prior to induction Oxygen Delivery Method: Circle System Utilized Preoxygenation: Pre-oxygenation with 100% oxygen Induction Type: IV induction Ventilation: Mask ventilation without difficulty Laryngoscope Size: Miller and 3 Grade View: Grade I Tube type: Oral Tube size: 7.5 mm Number of attempts: 1 Airway Equipment and Method: Stylet and Oral airway Placement Confirmation: ETT inserted through vocal cords under direct vision,  positive ETCO2 and breath sounds checked- equal and bilateral Secured at: 21 cm Tube secured with: Tape Dental Injury: Teeth and Oropharynx as per pre-operative assessment

## 2018-08-21 NOTE — Transfer of Care (Signed)
Immediate Anesthesia Transfer of Care Note  Patient: [name]  Procedure(s) Performed: POSTERIOR LUMBAR INTERBODY FUSION, INTERBODY PROSTHESIS, POSTERIOR INSTRUMENTATION/FUSION LUMBAR FOUR-FIVE (N/A )  Patient Location: PACU  Anesthesia Type:General  Level of Consciousness: oriented, drowsy and patient cooperative  Airway & Oxygen Therapy: Patient Spontanous Breathing and Patient connected to face mask oxygen  Post-op Assessment: Report given to RN and Post -op Vital signs reviewed and stable  Post vital signs: Reviewed  Last Vitals:  Vitals Value Taken Time  BP 151/83 08/21/2018  3:36 PM  Temp    Pulse 84 08/21/2018  3:38 PM  Resp 16 08/21/2018  3:38 PM  SpO2 95 % 08/21/2018  3:38 PM  Vitals shown include unvalidated device data.  Last Pain:  Vitals:   08/21/18 1106  TempSrc:   PainSc: 7          Complications: No apparent anesthesia complications

## 2018-08-21 NOTE — Anesthesia Preprocedure Evaluation (Addendum)
Anesthesia Evaluation  Patient identified by MRN, date of birth, ID band Patient awake    Reviewed: Allergy & Precautions, NPO status , Patient's Chart, lab work & pertinent test results, reviewed documented beta blocker date and time   History of Anesthesia Complications (+) PONV and history of anesthetic complications  Airway Mallampati: II  TM Distance: >3 FB Neck ROM: Full    Dental  (+) Upper Dentures, Lower Dentures, Dental Advisory Given   Pulmonary former smoker,    breath sounds clear to auscultation       Cardiovascular hypertension, Pt. on medications and Pt. on home beta blockers  Rhythm:Regular Rate:Normal     Neuro/Psych  Headaches, PSYCHIATRIC DISORDERS Anxiety Depression    GI/Hepatic Neg liver ROS, GERD  ,  Endo/Other  diabetes, Type 2, Insulin Dependent, Oral Hypoglycemic Agents  Renal/GU negative Renal ROS     Musculoskeletal  (+) Arthritis , Fibromyalgia -  Abdominal Normal abdominal exam  (+)   Peds  Hematology   Anesthesia Other Findings   Reproductive/Obstetrics                           Lab Results  Component Value Date   WBC 8.1 08/13/2018   HGB 14.4 08/13/2018   HCT 43.9 08/13/2018   MCV 92.6 08/13/2018   PLT 309 08/13/2018   Lab Results  Component Value Date   CREATININE 0.81 08/13/2018   BUN 8 08/13/2018   NA 141 08/13/2018   K 3.5 08/13/2018   CL 100 08/13/2018   CO2 28 08/13/2018   No results found for: INR, PROTIME  EKG: sinus tachycardia.   Anesthesia Physical Anesthesia Plan  ASA: II  Anesthesia Plan: General   Post-op Pain Management:    Induction: Intravenous  PONV Risk Score and Plan: 4 or greater and Ondansetron, Dexamethasone, Midazolam and Scopolamine patch - Pre-op  Airway Management Planned: Oral ETT  Additional Equipment: None  Intra-op Plan:   Post-operative Plan: Extubation in OR  Informed Consent: I have reviewed  the patients History and Physical, chart, labs and discussed the procedure including the risks, benefits and alternatives for the proposed anesthesia with the patient or authorized representative who has indicated his/her understanding and acceptance.   Dental advisory given  Plan Discussed with: CRNA  Anesthesia Plan Comments:         Anesthesia Quick Evaluation

## 2018-08-21 NOTE — Anesthesia Postprocedure Evaluation (Signed)
Anesthesia Post Note  Patient: Aahana Elza Bowne  Procedure(s) Performed: POSTERIOR LUMBAR INTERBODY FUSION, INTERBODY PROSTHESIS, POSTERIOR INSTRUMENTATION/FUSION LUMBAR FOUR-FIVE (N/A )     Patient location during evaluation: PACU Anesthesia Type: General Level of consciousness: awake and alert Pain management: pain level controlled Vital Signs Assessment: post-procedure vital signs reviewed and stable Respiratory status: spontaneous breathing, nonlabored ventilation, respiratory function stable and patient connected to nasal cannula oxygen Cardiovascular status: blood pressure returned to baseline and stable Postop Assessment: no apparent nausea or vomiting Anesthetic complications: no    Last Vitals:  Vitals:   08/21/18 1706 08/21/18 1730  BP: 140/87 112/71  Pulse: 94 83  Resp: 17 19  Temp:  36.8 C  SpO2: 95% 100%    Last Pain:  Vitals:   08/21/18 1730  TempSrc: Oral  PainSc:                  Shelton Silvas

## 2018-08-21 NOTE — Op Note (Signed)
Brief history: The patient is a 52 year old white female who has had previous back surgeries.  She has complained of recurrent back and left greater than right leg pain.  She has failed medical management and was worked up with a lumbar MRI.  This demonstrated a recurrent herniated disc and disc degeneration most prominent at L4-5.  I discussed the various treatment options with the patient.  She has weighed the risks, benefits, and alternatives surgery and decided proceed with an L4-5 decompression, instrumentation and fusion.  Preoperative diagnosis: L4-5 recurrent herniated disc, degenerative disc disease,  lumbago; lumbar radiculopathy  Postoperative diagnosis: The same  Procedure: Bilateral L4-5 laminotomy/foraminotomies to decompress the bilateral L4 and L5 nerve roots; L4-5 transforaminal lumbar interbody fusion with local morselized autograft bone and Kinnex graft extender; insertion of interbody prosthesis at L4-5 (globus peek expandable interbody prosthesis); posterior nonsegmental instrumentation from L4 to L5 with globus titanium pedicle screws and rods; posterior lateral arthrodesis at L4-5 with local morselized autograft bone and Kinnex bone graft extender.  Surgeon: Dr. Delma Officer  Asst.: Dr. Johnsie Cancel and Hildred Priest nurse practitioner  Anesthesia: Gen. endotracheal  Estimated blood loss: 250 cc  Drains: None  Complications: None  Description of procedure: The patient was brought to the operating room by the anesthesia team. General endotracheal anesthesia was induced. The patient was turned to the prone position on the Wilson frame. The patient's lumbosacral region was then prepared with Betadine scrub and Betadine solution. Sterile drapes were applied.  I then injected the area to be incised with Marcaine with epinephrine solution. I then used the scalpel to make a linear midline incision over the L4-5 interspace, incising through the old surgical scar. I then used  electrocautery to perform a bilateral subperiosteal dissection exposing the spinous process and lamina of L4 and L5. We then obtained intraoperative radiograph to confirm our location. We then inserted the Verstrac retractor to provide exposure.  I began the decompression by using the high speed drill to perform widen the previous laminotomies at L4-5. We then used the Kerrison punches to widen the laminotomy and removed the ligamentum flavum at L4-5 on the left, and the scar tissue on the right. We used the Kerrison punches to remove the medial facets at L4-5 bilaterally. We performed wide foraminotomies about the bilateral L4 and L5 nerve roots completing the decompression.  We now turned our attention to the posterior lumbar interbody fusion. I used a scalpel to incise the intervertebral disc at L4-5 bilaterally. I then performed a partial intervertebral discectomy at L4-5 bilaterally using the pituitary forceps. We prepared the vertebral endplates at L4-5 bilaterally for the fusion by removing the soft tissues with the curettes. We then used the trial spacers to pick the appropriate sized interbody prosthesis. We prefilled his prosthesis with a combination of local morselized autograft bone that we obtained during the decompression as well as Kinnex bone graft extender. We inserted the prefilled prosthesis into the interspace at L4-5 from the left, we then turned and expanded the prosthesis. There was a good snug fit of the prosthesis in the interspace. We then filled and the remainder of the intervertebral disc space with local morselized autograft bone and Kinnex. This completed the posterior lumbar interbody arthrodesis.  We now turned attention to the instrumentation. Under fluoroscopic guidance we cannulated the bilateral L4 and L5 pedicles with the bone probe. We then removed the bone probe. We then tapped the pedicle with a 6.5 millimeter tap. We then removed the tap. We probed  inside the tapped  pedicle with a ball probe to rule out cortical breaches. We then inserted a 7.5 x 40 millimeter pedicle screw into the L4 and L5 pedicles bilaterally under fluoroscopic guidance. We then palpated along the medial aspect of the pedicles to rule out cortical breaches. There were none. The nerve roots were not injured. We then connected the unilateral pedicle screws with a lordotic rod. We compressed the construct and secured the rod in place with the caps. We then tightened the caps appropriately. This completed the instrumentation from L4-5 bilaterally.  We now turned our attention to the posterior lateral arthrodesis at L4-5 bilaterally. We used the high-speed drill to decorticate the remainder of the facets, pars, transverse process at L4-5 bilaterally. We then applied a combination of local morselized autograft bone and Kinnex bone graft extender over these decorticated posterior lateral structures. This completed the posterior lateral arthrodesis.  We then obtained hemostasis using bipolar electrocautery. We irrigated the wound out with bacitracin solution. We inspected the thecal sac and nerve roots and noted they were well decompressed. We then removed the retractor. We placed vancomycin powder in the wound. We reapproximated patient's thoracolumbar fascia with interrupted #1 Vicryl suture. We reapproximated patient's subcutaneous tissue with interrupted 2-0 Vicryl suture. The reapproximated patient's skin with Steri-Strips and benzoin. The wound was then coated with bacitracin ointment. A sterile dressing was applied. The drapes were removed. The patient was subsequently returned to the supine position where they were extubated by the anesthesia team. He was then transported to the post anesthesia care unit in stable condition. All sponge instrument and needle counts were reportedly correct at the end of this case.

## 2018-08-21 NOTE — H&P (Signed)
Subjective: The patient is a 52 year old black female whose had a previous lumbar laminectomy discectomy.  She is developed recurrent back and leg pain.  She has failed medical management.  She was worked up with a lumbar MRI.  This demonstrated L4-5 recurrent disc, degenerative disease, etc.  I discussed the various treatment options with the patient including surgery.  She has decided to proceed with an L4-5 decompression, nstrumentation and fusion after weighing the risks, benefits and alternatives.  Past Medical History:  Diagnosis Date  . Arthritis   . BMI (body mass index) 20.0-29.9 2008 192 lbs  . Complication of anesthesia    post op N/V  . Depression   . Diabetes mellitus   . Fibromyalgia   . GERD (gastroesophageal reflux disease) 2002   202 lbs  . Hypertension   . Irritable bowel syndrome 2008   diarrhea predominant MEDS TRIED: BENTYL, LEVBID  . LBP (low back pain)   . PONV (postoperative nausea and vomiting)   . PTSD (post-traumatic stress disorder)   . RUQ abdominal pain 2004 HIDA NL CTA w/ IVC-FATTY LIVER, ANGIOMYOLIPOMA LEFT KIDNEY   2008 HIDA NL    Past Surgical History:  Procedure Laterality Date  . ANKLE SURGERY  RIGHT  . BREAST REDUCTION SURGERY  2006  . COLONOSCOPY  2008 RMR   PAN-COLONIC TICS, NL TI  . COLONOSCOPY  2013   Dr. Teena Dunk: diverticulum in ascending colon, mild diverticulosis in sigmoid. Recommended 5 year surveillance  . COLONOSCOPY WITH PROPOFOL N/A 05/12/2018   Procedure: COLONOSCOPY WITH PROPOFOL;  Surgeon: Corbin Ade, MD;  Location: [facility];  Service: Endoscopy;  Laterality: N/A;  12:15pm  . ESOPHAGEAL DILATION N/A 04/06/2015   Procedure: ESOPHAGEAL DILATION;  Surgeon: Corbin Ade, MD;  Location: [facility];  Service: Gastroenterology;  Laterality: N/A;  300  . ESOPHAGOGASTRODUODENOSCOPY N/A 04/06/2015   RMR: Normal EGD status post Maloney dilation of teh esophagus as described above  . ESOPHAGOGASTRODUODENOSCOPY (EGD) WITH  PROPOFOL N/A 05/12/2018   Procedure: ESOPHAGOGASTRODUODENOSCOPY (EGD) WITH PROPOFOL;  Surgeon: Corbin Ade, MD;  Location: [facility];  Service: Endoscopy;  Laterality: N/A;  . LAPAROSCOPIC BILATERAL SALPINGO OOPHERECTOMY Bilateral 11/22/2015   Procedure: LAPAROSCOPIC BILATERAL SALPINGO OOPHORECTOMY;  Surgeon: Tilda Burrow, MD;  Location: [facility];  Service: Gynecology;  Laterality: Bilateral;  . LUMBAR DISC SURGERY  2005   x2  . MALONEY DILATION N/A 05/12/2018   Procedure: Elease Hashimoto DILATION;  Surgeon: Corbin Ade, MD;  Location: [facility];  Service: Endoscopy;  Laterality: N/A;  . SHOULDER SURGERY  1997 RIGHT  . TOTAL ABDOMINAL HYSTERECTOMY  2000 fibroids/DUB  . UPPER GASTROINTESTINAL ENDOSCOPY  2008 RMR   [facility] HH o/w NL  . vocal cord polyp removal  2013   Dr. Josephina Shih    Allergies  Allergen Reactions  . Neurontin [Gabapentin] Other (See Comments)    Out of it and could not function in this while on all the other bp meds.    Social History   Tobacco Use  . Smoking status: Former Smoker    Packs/day: 0.50    Years: 2.00    Pack years: 1.00    Types: Cigarettes  . Smokeless tobacco: Never Used  Substance Use Topics  . Alcohol use: No    Alcohol/week: 0.0 standard drinks    Family History  Problem Relation Age of Onset  . Breast cancer Maternal Aunt   . Anxiety disorder Maternal Aunt   . Anxiety disorder Mother   .  Stroke Mother   . COPD Mother   . Hypertension Mother   . Thyroid disease Mother   . Diabetes Mother   . Drug abuse Daughter   . Anxiety disorder Daughter   . Seizures Sister   . Alcohol abuse Maternal Uncle   . Mental illness Cousin   . Dementia Maternal Aunt   . Anxiety disorder Sister   . Stroke Sister   . Anxiety disorder Sister   . Drug abuse Sister   . Early death Sister   . Heart disease Sister   . Heart attack Sister   . Stroke Maternal Grandmother   . Colon cancer Maternal Aunt        in her 17s  . Pancreatic cancer Maternal  Aunt        great aunt  . Colon polyps Neg Hx   . ADD / ADHD Neg Hx   . Bipolar disorder Neg Hx   . Depression Neg Hx   . OCD Neg Hx   . Paranoid behavior Neg Hx   . Schizophrenia Neg Hx   . Sexual abuse Neg Hx   . Physical abuse Neg Hx    Prior to Admission medications   Medication Sig Start Date End Date Taking? Authorizing Provider  aspirin 81 MG tablet Take 81 mg by mouth daily.    Yes [provider]  atorvastatin (LIPITOR) 10 MG tablet Take 10 mg by mouth daily.  09/09/13  Yes [provider]  buPROPion (WELLBUTRIN XL) 150 MG 24 hr tablet Take 150 mg by mouth daily. 04/06/18  Yes [provider]  busPIRone (BUSPAR) 15 MG tablet Take 15 mg by mouth 3 (three) times daily. 02/04/18  Yes [provider]  Cholecalciferol (VITAMIN D-3) 5000 units TABS Take 5,000 Units by mouth daily.   Yes [provider]  DEXILANT 60 MG capsule TAKE 1 CAPSULE BY MOUTH ONCE A DAY. 03/22/17  Yes Tiffany Kocher, PA-C  dicyclomine (BENTYL) 10 MG capsule TAKE 1 CAPSULE(10 MG) BY MOUTH FOUR TIMES DAILY BEFORE MEALS AND AT BEDTIME Patient taking differently: Take 10 mg by mouth 4 (four) times daily -  before meals and at bedtime.  08/04/18  Yes Tiffany Kocher, PA-C  DULoxetine (CYMBALTA) 60 MG capsule Take 1 capsule (60 mg total) by mouth daily. 02/04/17 04/30/19 Yes Myrlene Broker, MD  furosemide (LASIX) 20 MG tablet Take 10 mg by mouth daily as needed for fluid.    Yes [provider]  insulin lispro (HUMALOG) 100 UNIT/ML KiwkPen Inject 1-4 Units into the skin 3 (three) times daily as needed for high blood sugar (for blood sugars over 150).    Yes [provider]  Liraglutide (VICTOZA) 18 MG/3ML SOPN Inject 1.8 mg into the skin at bedtime.    Yes [provider]  metFORMIN (GLUCOPHAGE) 500 MG tablet Take 1,000 mg by mouth 2 (two) times daily with a meal.   Yes [provider]  methocarbamol (ROBAXIN) 500 MG tablet Take 1 tablet by  mouth 2 (two) times daily as needed for muscle spasms.  07/14/18  Yes [provider]  metoprolol (LOPRESSOR) 100 MG tablet Take 100 mg by mouth daily.  03/23/11  Yes [provider]  Multiple Vitamin (MULTIVITAMIN WITH MINERALS) TABS tablet Take 1 tablet by mouth daily.   Yes [provider]  oxyCODONE-acetaminophen (PERCOCET) 10-325 MG tablet Take 1 tablet by mouth every 4 (four) hours as needed for pain.  07/16/17  Yes [provider]  pioglitazone (ACTOS) 15 MG tablet Take 15 mg by mouth daily. 03/27/18  Yes [provider]  Polyethyl Glycol-Propyl Glycol (SYSTANE) 0.4-0.3 % SOLN Place 1-2 drops into both eyes 3 (three) times daily as needed (for dry eyes.).   Yes [provider]  potassium chloride (K-DUR) 10 MEQ tablet Take 10 mEq by mouth daily as needed. For fluid retention with lasix use. 02/28/18  Yes [provider]  valsartan-hydrochlorothiazide (DIOVAN-HCT) 160-25 MG per tablet Take 1 tablet by mouth daily. 11/02/14  Yes [provider]  VOLTAREN 1 % GEL Apply 2 g topically daily as needed (pain).  10/28/14  Yes [provider]  XTAMPZA ER 18 MG C12A Take 18 mg by mouth every 12 (twelve) hours as needed (pain).  07/16/17  Yes [provider]  COMBIVENT RESPIMAT 20-100 MCG/ACT AERS respimat Inhale 2 puffs into the lungs every 6 (six) hours as needed for wheezing or shortness of breath.  04/24/16   [provider]  fexofenadine (ALLEGRA) 180 MG tablet Take 180 mg by mouth daily.     [provider]  fluticasone (FLONASE) 50 MCG/ACT nasal spray Place 2 sprays into both nostrils daily as needed for allergies.     [provider]  lidocaine (LIDODERM) 5 % Place 1 patch onto the skin daily as needed (pain). Remove & Discard patch within 12 hours or as directed by MD    [provider]  mirtazapine (REMERON) 15 MG tablet Take 15 mg by mouth at bedtime.    [provider]   prazosin (MINIPRESS) 1 MG capsule Take 1 mg by mouth at bedtime. 04/06/18   [provider]  Probiotic Product (PHILLIPS COLON HEALTH PO) Take 1 capsule by mouth daily.    [provider]     Review of Systems  Positive ROS: As above  All other systems have been reviewed and were otherwise negative with the exception of those mentioned in the HPI and as above.  Objective: Vital signs in last 24 hours: Temp:  [98.3 F (36.8 C)] 98.3 F (36.8 C) (10/03 0946) Pulse Rate:  [77] 77 (10/03 0946) Resp:  [20] 20 (10/03 0946) BP: (136)/(86) 136/86 (10/03 0949) SpO2:  [100 %] 100 % (10/03 0946) Weight:  [93.4 kg] 93.4 kg (10/03 0946) Estimated body mass index is 28.73 kg/m as calculated from the following:   Height as of this encounter: 5\' 11"  (1.803 m).   Weight as of this encounter: 93.4 kg.   General Appearance: Alert Head: Normocephalic, without obvious abnormality, atraumatic Eyes: PERRL, conjunctiva/corneas clear, EOM's intact,    Ears: Normal  Throat: Normal  Neck: Supple, Back: Lumbar incision is well-healed. Lungs: Clear to auscultation bilaterally, respirations unlabored Heart: Regular rate and rhythm, no murmur, rub or gallop Abdomen: Soft, non-tender Extremities: Extremities normal, atraumatic, no cyanosis or edema Skin: unremarkable  NEUROLOGIC:   Mental status: alert and oriented,Motor Exam - grossly normal Sensory Exam - grossly normal Reflexes:  Coordination - grossly normal Gait - grossly normal Balance - grossly normal Cranial Nerves: I: smell Not tested  II: visual acuity  OS: Normal  OD: Normal   II: visual fields Full to confrontation  II: pupils Equal, round, reactive to light  III,VII: ptosis None  III,IV,VI: extraocular muscles  Full ROM  V: mastication Normal  V: facial light touch sensation  Normal  V,VII: corneal reflex  Present  VII: facial muscle function - upper  Normal  VII: facial muscle function - lower Normal  VIII:  hearing Not tested  IX: soft palate elevation  Normal  IX,X: gag reflex Present  XI: trapezius strength  5/5  XI: sternocleidomastoid strength 5/5  XI: neck flexion strength  5/5  XII: tongue strength  Normal    Data Review Lab Results  Component Value Date   WBC 8.1 08/13/2018   HGB 14.4 08/13/2018   HCT 43.9 08/13/2018   MCV 92.6 08/13/2018   PLT 309 08/13/2018   Lab Results  Component Value Date   NA 141 08/13/2018   K 3.5 08/13/2018   CL 100 08/13/2018   CO2 28 08/13/2018   BUN 8 08/13/2018   CREATININE 0.81 08/13/2018   GLUCOSE 137 (H) 08/13/2018   No results found for: INR, PROTIME  Assessment/Plan: L4-5 degenerative disease, recurrent herniated disc, lumbago, lumbar radiculopathy: I have discussed the situation with the patient.  I have reviewed her imaging studies with her and pointed out the abnormalities.  We have discussed the various treatment options including surgery.  I have described the surgical treatment option of an L4-5 decompression, instrumentation and fusion.  I have shown her surgical models.  I have given her a surgical pamphlet.  We have discussed the risks, benefits, alternatives, expected postoperative course, and likelihood of achieving our goals with surgery.  I have answered all her questions.  She has decided to proceed with surgery.   Cristi Loron 08/21/2018 11:17 AM

## 2018-08-22 LAB — GLUCOSE, CAPILLARY
GLUCOSE-CAPILLARY: 159 mg/dL — AB (ref 70–99)
GLUCOSE-CAPILLARY: 146 mg/dL — AB (ref 70–99)
Glucose-Capillary: 201 mg/dL — ABNORMAL HIGH (ref 70–99)
Glucose-Capillary: 162 mg/dL — ABNORMAL HIGH (ref 70–99)

## 2018-08-22 LAB — BASIC METABOLIC PANEL
Anion gap: 7 (ref 5–15)
BUN: 7 mg/dL (ref 6–20)
CALCIUM: 8.5 mg/dL — AB (ref 8.9–10.3)
CO2: 29 mmol/L (ref 22–32)
CREATININE: 0.75 mg/dL (ref 0.44–1.00)
Chloride: 103 mmol/L (ref 98–111)
GFR calc Af Amer: >60 mL/min (ref >60)
GFR calc non Af Amer: >60 mL/min (ref >60)
Glucose, Bld: 182 mg/dL — ABNORMAL HIGH (ref 70–99)
Potassium: 3.4 mmol/L — ABNORMAL LOW (ref 3.5–5.1)
SODIUM: 139 mmol/L (ref 135–145)

## 2018-08-22 LAB — CBC
HCT: 35.6 % — ABNORMAL LOW (ref 36.0–46.0)
HEMOGLOBIN: 11.7 g/dL — AB (ref 12.0–15.0)
MCH: 30.5 pg (ref 26.0–34.0)
MCHC: 32.9 g/dL (ref 30.0–36.0)
MCV: 93 fL (ref 78.0–100.0)
Platelets: 260 10*3/uL (ref 150–400)
RBC: 3.83 MIL/uL — ABNORMAL LOW (ref 3.87–5.11)
RDW: 12.7 % (ref 11.5–15.5)
WBC: 11.4 10*3/uL — ABNORMAL HIGH (ref 4.0–10.5)

## 2018-08-22 MED ORDER — METHOCARBAMOL 500 MG PO TABS
500.0000 mg | ORAL_TABLET | Freq: Once | ORAL | Status: AC
  Administered 2018-08-22: 500 mg via ORAL
  Filled 2018-08-22: qty 1

## 2018-08-22 NOTE — Evaluation (Signed)
Physical Therapy Evaluation & Discharge Patient Details Name: [name] MRN: [id number] DOB: August 14, 1966 Today's Date: 08/22/2018   History of Present Illness  Pt is a 52y/o female who had a previous lumbar laminectomy discectomy (2005).  She is developed recurrent back and leg pain. Now s/p L4-5 decompression, instrumentation and fusion. PMH including but not limited to depression, DM, HTN and PTSD.  Clinical Impression  Pt presented sitting EOB eating breakfast, awake and willing to participate in therapy session. Prior to admission, pt reported that she was independent with all functional mobility and ADLs. Pt lives with her daughter and grandchildren. She reported that she will have assistance from family 24/7 upon d/c. Pt currently able to perform all mobility with supervision for safety. PT provided pt with back precautions handout and reviewed with pt throughout. PT also discussed a generalized walking program for pt to initiate upon d/c. PT answered all of pt's questions at end of session. No further acute PT needs identified at this time. PT signing off.     Follow Up Recommendations No PT follow up;Supervision - Intermittent    Equipment Recommendations  None recommended by PT    Recommendations for Other Services       Precautions / Restrictions Precautions Precautions: Back Precaution Booklet Issued: Yes (comment) Precaution Comments: back handout reviewed in full Required Braces or Orthoses: Spinal Brace Spinal Brace: Applied in sitting position Restrictions Weight Bearing Restrictions: No      Mobility  Bed Mobility               General bed mobility comments: pt sitting EOB upon arrival  Transfers Overall transfer level: Needs assistance Equipment used: None Transfers: Sit to/from Stand Sit to Stand: Supervision         General transfer comment: supervision for safety  Ambulation/Gait Ambulation/Gait assistance: Supervision Gait Distance (Feet):  500 Feet Assistive device: None Gait Pattern/deviations: Step-through pattern;Decreased stride length Gait velocity: decreased Gait velocity interpretation: 1.31 - 2.62 ft/sec, indicative of limited community ambulator General Gait Details: pt steady with ambulation, no instability/LOB or need for physical assistance, supervision for safety  Stairs Stairs: Yes Stairs assistance: Supervision Stair Management: One rail Left;Step to pattern;Forwards Number of Stairs: 2 General stair comments: no difficulties  Wheelchair Mobility    Modified Rankin (Stroke Patients Only)       Balance Overall balance assessment: No apparent balance deficits (not formally assessed)                                           Pertinent Vitals/Pain Pain Assessment: Faces Faces Pain Scale: Hurts little more Pain Location: back Pain Descriptors / Indicators: Sore Pain Intervention(s): Monitored during session;Repositioned    Home Living Family/patient expects to be discharged to:: Private residence Living Arrangements: Children;Other relatives Available Help at Discharge: Family;Available 24 hours/day Type of Home: House Home Access: Level entry     Home Layout: One level;Other (Comment)(two steps to enter bed room) Home Equipment: None      Prior Function Level of Independence: Independent               Hand Dominance        Extremity/Trunk Assessment   Upper Extremity Assessment Upper Extremity Assessment: Defer to OT evaluation;Overall [facility] for tasks assessed    Lower Extremity Assessment Lower Extremity Assessment: Overall WFL for tasks assessed    Cervical / Trunk  Assessment Cervical / Trunk Assessment: Other exceptions Cervical / Trunk Exceptions: s/p lumbar sx  Communication   Communication: No difficulties  Cognition Arousal/Alertness: Awake/alert Behavior During Therapy: WFL for tasks assessed/performed Overall Cognitive Status: Within  Functional Limits for tasks assessed                                        General Comments      Exercises     Assessment/Plan    PT Assessment Patent does not need any further PT services  PT Problem List         PT Treatment Interventions      PT Goals (Current goals can be found in the Care Plan section)  Acute Rehab PT Goals Patient Stated Goal: decrease pain    Frequency     Barriers to discharge        Co-evaluation               AM-PAC PT "6 Clicks" Daily Activity  Outcome Measure Difficulty turning over in bed (including adjusting bedclothes, sheets and blankets)?: A Little Difficulty moving from lying on back to sitting on the side of the bed? : A Little Difficulty sitting down on and standing up from a chair with arms (e.g., wheelchair, bedside commode, etc,.)?: A Little Help needed moving to and from a bed to chair (including a wheelchair)?: None Help needed walking in hospital room?: None Help needed climbing 3-5 steps with a railing? : None 6 Click Score: 21    End of Session Equipment Utilized During Treatment: Back brace Activity Tolerance: Patient tolerated treatment well Patient left: with call bell/phone within reach;Other (comment)(with OT in bathroom) Nurse Communication: Mobility status PT Visit Diagnosis: Other abnormalities of gait and mobility (R26.89);Pain Pain - part of body: (back)    Time: 0981-1914 PT Time Calculation (min) (ACUTE ONLY): 13 min   Charges:   PT Evaluation $PT Eval Low Complexity: 1 Low          Deborah Chalk, PT, DPT  Acute Rehabilitation Services Pager [phone] Office [phone]    Alessandra Bevels Myrikal Messmer 08/22/2018, 10:06 AM

## 2018-08-22 NOTE — Evaluation (Signed)
Occupational Therapy Evaluation and Discharge Patient Details Name: [name] MRN: [id number] DOB: [birth year] Today's Date: 08/22/2018    History of Present Illness Pt is a 52y/o female who had a previous lumbar laminectomy discectomy (2005).  She is developed recurrent back and leg pain. Now s/p L4-5 decompression, instrumentation and fusion. PMH including but not limited to depression, DM, HTN and PTSD.   Clinical Impression   PTA Pt required assist for LB dressing due to pain, and Pt was not walking much due to pain. Pt is currently supervision for transfers, supervision for all ADL with the exception of LB dressing (which she requires mod A for). Back handout provided and reviewed adls in detail. Pt educated on: clothing between brace, never sleep in brace, set an alarm at night for medication, avoid sitting for long periods of time, correct bed positioning for sleeping, correct sequence for bed mobility, avoiding lifting more than 5 pounds and never wash directly over incision. Also Pt educated on 3 in 1 and uses, and Pt provided with reacher/grabber for LB dressing. All education is complete and patient indicates understanding. Thank you for the opportunity to serve this patient.     Follow Up Recommendations  No OT follow up;Supervision - Intermittent    Equipment Recommendations  3 in 1 bedside commode(reacher/grabber provided)    Recommendations for Other Services       Precautions / Restrictions Precautions Precautions: Back Precaution Booklet Issued: Yes (comment) Precaution Comments: back handout reviewed in full Required Braces or Orthoses: Spinal Brace Spinal Brace: Applied in sitting position;Lumbar corset Restrictions Weight Bearing Restrictions: No      Mobility Bed Mobility               General bed mobility comments: reviewed verbally and performed demonstration for Pt  Transfers Overall transfer level: Needs assistance Equipment used:  None Transfers: Sit to/from Stand Sit to Stand: Supervision         General transfer comment: supervision for safety    Balance Overall balance assessment: No apparent balance deficits (not formally assessed)                                         ADL either performed or assessed with clinical judgement   ADL Overall ADL's : Needs assistance/impaired Eating/Feeding: Independent   Grooming: Supervision/safety;Cueing for compensatory techniques;Standing Grooming Details (indicate cue type and reason): educated in compensatory strategies for ADL and grooming (cup method for oral care, setting up items on the right) Upper Body Bathing: Supervision/ safety;Standing;Sitting Upper Body Bathing Details (indicate cue type and reason): educated on 3 in 1 as shower chair and use of long handle sponge Lower Body Bathing: Supervison/ safety Lower Body Bathing Details (indicate cue type and reason): educated on 3 in 1 as shower chair and use of long handle sponge Upper Body Dressing : Supervision/safety Upper Body Dressing Details (indicate cue type and reason): educated on don/doff brace Lower Body Dressing: Moderate assistance;Sit to/from stand Lower Body Dressing Details (indicate cue type and reason): Pt unable to bring feet cross to knees at this time - states she will have assist from grandchildren and daughter Toilet Transfer: Ambulation;Supervision/safety Statistician Details (indicate cue type and reason): 3 in 1 over toilet with arm rests to assist with transfers Toileting- Clothing Manipulation and Hygiene: Supervision/safety;Sitting/lateral lean   Tub/ Shower Transfer: Tub transfer;Min guard;Ambulation;3 in 1 Tub/Shower Transfer Details (  indicate cue type and reason): educated on safety with transfer and use of 3 in 1 as shower chair Functional mobility during ADLs: Supervision/safety General ADL Comments: Educated in ADL application for maintaining back  precautions     Vision Patient Visual Report: No change from baseline Vision Assessment?: No apparent visual deficits     Perception     Praxis      Pertinent Vitals/Pain Pain Assessment: Faces Faces Pain Scale: Hurts little more Pain Location: back Pain Descriptors / Indicators: Sore Pain Intervention(s): Monitored during session;Repositioned     Hand Dominance Right   Extremity/Trunk Assessment Upper Extremity Assessment Upper Extremity Assessment: Overall WFL for tasks assessed   Lower Extremity Assessment Lower Extremity Assessment: Defer to PT evaluation   Cervical / Trunk Assessment Cervical / Trunk Assessment: Other exceptions Cervical / Trunk Exceptions: s/p lumbar sx   Communication Communication Communication: No difficulties   Cognition Arousal/Alertness: Awake/alert Behavior During Therapy: WFL for tasks assessed/performed Overall Cognitive Status: Within Functional Limits for tasks assessed                                     General Comments       Exercises     Shoulder Instructions      Home Living Family/patient expects to be discharged to:: Private residence Living Arrangements: Children;Other relatives Available Help at Discharge: Family;Available 24 hours/day Type of Home: House Home Access: Level entry     Home Layout: One level;Other (Comment)(two steps to enter bed room)     Bathroom Shower/Tub: Chief Strategy Officer: Standard     Home Equipment: None          Prior Functioning/Environment Level of Independence: Independent                 OT Problem List: Decreased activity tolerance;Decreased knowledge of use of DME or AE;Decreased knowledge of precautions;Pain      OT Treatment/Interventions:      OT Goals(Current goals can be found in the care plan section) Acute Rehab OT Goals Patient Stated Goal: decrease pain OT Goal Formulation: With patient Time For Goal Achievement:  09/05/18 Potential to Achieve Goals: Good  OT Frequency:     Barriers to D/C:            Co-evaluation              AM-PAC PT "6 Clicks" Daily Activity     Outcome Measure Help from another person eating meals?: None Help from another person taking care of personal grooming?: None Help from another person toileting, which includes using toliet, bedpan, or urinal?: None Help from another person bathing (including washing, rinsing, drying)?: A Little Help from another person to put on and taking off regular upper body clothing?: None Help from another person to put on and taking off regular lower body clothing?: A Lot 6 Click Score: 21   End of Session Equipment Utilized During Treatment: Back brace Nurse Communication: Mobility status  Activity Tolerance: Patient tolerated treatment well Patient left: in chair;with call bell/phone within reach  OT Visit Diagnosis: Pain;Unsteadiness on feet (R26.81) Pain - part of body: (back)                Time: 4098-1191 OT Time Calculation (min): 20 min Charges:  OT General Charges $OT Visit: 1 Visit OT Evaluation $OT Eval Low Complexity: 1 Low  Sencere Symonette OTR/L Acute  Rehabilitation Services Pager: [phone] Office: [phone]  Evern Bio Burrel Legrand 08/22/2018, 11:29 AM

## 2018-08-22 NOTE — Progress Notes (Signed)
Subjective: The patient is alert and pleasant.  Her back is appropriately sore.  She is contemplating going home later on today.  Objective: Vital signs in last 24 hours: Temp:  [97.9 F (36.6 C)-98.6 F (37 C)] 98.4 F (36.9 C) (10/04 0354) Pulse Rate:  [76-95] 90 (10/04 0354) Resp:  [11-27] 20 (10/04 0354) BP: (108-154)/(61-110) 108/61 (10/04 0354) SpO2:  [88 %-100 %] 94 % (10/04 0354) Weight:  [93.4 kg] 93.4 kg (10/03 0946) Estimated body mass index is 28.73 kg/m as calculated from the following:   Height as of this encounter: 5\' 11"  (1.803 m).   Weight as of this encounter: 93.4 kg.   Intake/Output from previous day: 10/03 0701 - 10/04 0700 In: 1850 [I.V.:1850] Out: 350 [Urine:250; Blood:100] Intake/Output this shift: No intake/output data recorded.  Physical exam the patient is alert and oriented.  Her lower extremity strength is normal.  Her dressing is clean and dry.  Lab Results: No results for input(s): WBC, HGB, HCT, PLT in the last 72 hours. BMET No results for input(s): NA, K, CL, CO2, GLUCOSE, BUN, CREATININE, CALCIUM in the last 72 hours.  Studies/Results: Dg Lumbar Spine 2-3 Views  Result Date: 08/21/2018 CLINICAL DATA:  Intraoperative imaging for patient undergoing L4-5 laminectomy and fusion. EXAM: DG C-ARM 61-120 MIN; LUMBAR SPINE - 2-3 VIEW COMPARISON:  MRI lumbar spine 04/08/2018. FINDINGS: Two fluoroscopic intraoperative views of the lower lumbar spine are provided. Images demonstrate pedicle screws and interbody spacer in place at L4-5. No acute abnormality is identified. IMPRESSION: L4-5 laminectomy and fusion in progress. Electronically Signed   By: Drusilla Kanner M.D.   On: 08/21/2018 15:16   Dg Lumbar Spine 1 View  Result Date: 08/21/2018 CLINICAL DATA:  Posterior fusion EXAM: LUMBAR SPINE - 1 VIEW COMPARISON:  Lumbar radiographs March 08, 2015 and Apr 08, 2018. FINDINGS: Cross-table lateral lumbar image labeled #1 submitted. The numeration for  this examination is the same as for the previous lumbar MR examination with the lowermost interspace denoted as L5-S1. Metallic probe tip is posterior to the L5-S1 interspace. No fracture or spondylolisthesis. There is mild disc space narrowing at L4-5. IMPRESSION: Probe tip is posterior to the L5-S1 interspace. No fracture or spondylolisthesis. Disc space narrowing noted at L4-5. Electronically Signed   By: Bretta Bang III M.D.   On: 08/21/2018 15:14   Dg C-arm 1-60 Min  Result Date: 08/21/2018 CLINICAL DATA:  Intraoperative imaging for patient undergoing L4-5 laminectomy and fusion. EXAM: DG C-ARM 61-120 MIN; LUMBAR SPINE - 2-3 VIEW COMPARISON:  MRI lumbar spine 04/08/2018. FINDINGS: Two fluoroscopic intraoperative views of the lower lumbar spine are provided. Images demonstrate pedicle screws and interbody spacer in place at L4-5. No acute abnormality is identified. IMPRESSION: L4-5 laminectomy and fusion in progress. Electronically Signed   By: Drusilla Kanner M.D.   On: 08/21/2018 15:16    Assessment/Plan: Postop day #1: The patient is doing well.  We will continue to mobilize her with PT and OT.  She may go home later on today.  I gave her discharge instructions and answered all her questions.  LOS: 1 day     Cristi Loron 08/22/2018, 7:09 AM

## 2018-08-23 LAB — GLUCOSE, CAPILLARY: Glucose-Capillary: 122 mg/dL — ABNORMAL HIGH (ref 70–99)

## 2018-08-23 MED ORDER — METHOCARBAMOL 1000 MG/10ML IJ SOLN
500.0000 mg | Freq: Four times a day (QID) | INTRAVENOUS | Status: DC | PRN
  Filled 2018-08-23: qty 5

## 2018-08-23 MED ORDER — METHOCARBAMOL 750 MG PO TABS: 750.0000 mg | ORAL_TABLET | Freq: Four times a day (QID) | ORAL | 1 refills | Status: DC

## 2018-08-23 MED ORDER — DEXAMETHASONE 4 MG PO TABS
4.0000 mg | ORAL_TABLET | Freq: Once | ORAL | Status: AC
  Administered 2018-08-23: 4 mg via ORAL
  Filled 2018-08-23: qty 1

## 2018-08-23 MED ORDER — METHOCARBAMOL 500 MG PO TABS
500.0000 mg | ORAL_TABLET | Freq: Four times a day (QID) | ORAL | Status: DC | PRN
  Administered 2018-08-23: 500 mg via ORAL
  Filled 2018-08-23: qty 1

## 2018-08-23 MED ORDER — METHOCARBAMOL 500 MG PO TABS: 500.0000 mg | ORAL_TABLET | Freq: Four times a day (QID) | ORAL | 1 refills | Status: DC | PRN

## 2018-08-23 MED ORDER — OXYCODONE HCL 10 MG PO TABS: 10.0000 mg | ORAL_TABLET | ORAL | 0 refills | Status: DC | PRN

## 2018-08-23 NOTE — Discharge Summary (Signed)
Physician Discharge Summary  Patient ID: [name] MRN: [id number] DOB/AGE: 52/09/1966 52 y.o. Estimated body mass index is 28.73 kg/m as calculated from the following:   Height as of this encounter: 5\' 11"  (1.803 m).   Weight as of this encounter: 93.4 kg.   Admit date: 08/21/2018 Discharge date: 08/23/2018  Admission Diagnoses: Lumbar spondylosis and stenosis L4-5   Discharge Diagnoses: Same Active Problems:   Recurrent displacement of lumbar disc   Discharged Condition: good  Hospital Course: Patient was admitted hospital underwent decompression and stabilization procedure at L4-5.  Postop the patient did fairly well however was still complaining of severe muscle spasms.  Patient will observe this morning placed on steroids and muscle relaxers and if stable will be discharged later this morning.  Consults: Significant Diagnostic Studies: Treatments: L4-5 posterior lumbar interbody fusion Discharge Exam: Blood pressure 113/78, pulse 98, temperature 99.1 F (37.3 C), temperature source Oral, resp. rate 18, height 5\' 11"  (1.803 m), weight 93.4 kg, SpO2 99 %. Strength 5 out of 5 wound clean dry and intact  Disposition: Home   Allergies as of 08/23/2018      Reactions   Neurontin [gabapentin] Other (See Comments)   Out of it and could not function in this while on all the other bp meds.      Medication List    TAKE these medications   aspirin 81 MG tablet Take 81 mg by mouth daily.   atorvastatin 10 MG tablet Commonly known as:  LIPITOR Take 10 mg by mouth daily.   buPROPion 150 MG 24 hr tablet Commonly known as:  WELLBUTRIN XL Take 150 mg by mouth daily.   busPIRone 15 MG tablet Commonly known as:  BUSPAR Take 15 mg by mouth 3 (three) times daily.   COMBIVENT RESPIMAT 20-100 MCG/ACT Aers respimat Generic drug:  Ipratropium-Albuterol Inhale 2 puffs into the lungs every 6 (six) hours as needed for wheezing or shortness of breath.   DEXILANT 60 MG  capsule Generic drug:  dexlansoprazole TAKE 1 CAPSULE BY MOUTH ONCE A DAY.   dicyclomine 10 MG capsule Commonly known as:  BENTYL TAKE 1 CAPSULE(10 MG) BY MOUTH FOUR TIMES DAILY BEFORE MEALS AND AT BEDTIME What changed:  See the new instructions.   DULoxetine 60 MG capsule Commonly known as:  CYMBALTA Take 1 capsule (60 mg total) by mouth daily.   fexofenadine 180 MG tablet Commonly known as:  ALLEGRA Take 180 mg by mouth daily.   fluticasone 50 MCG/ACT nasal spray Commonly known as:  FLONASE Place 2 sprays into both nostrils daily as needed for allergies.   furosemide 20 MG tablet Commonly known as:  LASIX Take 10 mg by mouth daily as needed for fluid.   insulin lispro 100 UNIT/ML KiwkPen Commonly known as:  HUMALOG Inject 1-4 Units into the skin 3 (three) times daily as needed for high blood sugar (for blood sugars over 150).   lidocaine 5 % Commonly known as:  LIDODERM Place 1 patch onto the skin daily as needed (pain). Remove & Discard patch within 12 hours or as directed by MD   metFORMIN 500 MG tablet Commonly known as:  GLUCOPHAGE Take 1,000 mg by mouth 2 (two) times daily with a meal.   methocarbamol 500 MG tablet Commonly known as:  ROBAXIN Take 1 tablet by mouth 2 (two) times daily as needed for muscle spasms. What changed:  Another medication with the same name was added. Make sure you understand how and when to take each.  methocarbamol 750 MG tablet Commonly known as:  ROBAXIN Take 1 tablet (750 mg total) by mouth 4 (four) times daily. What changed:  You were already taking a medication with the same name, and this prescription was added. Make sure you understand how and when to take each.   methocarbamol 500 MG tablet Commonly known as:  ROBAXIN Take 1 tablet (500 mg total) by mouth every 6 (six) hours as needed for muscle spasms. What changed:  You were already taking a medication with the same name, and this prescription was added. Make sure you  understand how and when to take each.   metoprolol tartrate 100 MG tablet Commonly known as:  LOPRESSOR Take 100 mg by mouth daily.   mirtazapine 15 MG tablet Commonly known as:  REMERON Take 15 mg by mouth at bedtime.   multivitamin with minerals Tabs tablet Take 1 tablet by mouth daily.   Oxycodone HCl 10 MG Tabs Take 1 tablet (10 mg total) by mouth every 3 (three) hours as needed for severe pain ((score 7 to 10)).   oxyCODONE-acetaminophen 10-325 MG tablet Commonly known as:  PERCOCET Take 1 tablet by mouth every 4 (four) hours as needed for pain.   PHILLIPS COLON HEALTH PO Take 1 capsule by mouth daily.   pioglitazone 15 MG tablet Commonly known as:  ACTOS Take 15 mg by mouth daily.   potassium chloride 10 MEQ tablet Commonly known as:  K-DUR Take 10 mEq by mouth daily as needed. For fluid retention with lasix use.   prazosin 1 MG capsule Commonly known as:  MINIPRESS Take 1 mg by mouth at bedtime.   SYSTANE 0.4-0.3 % Soln Generic drug:  Polyethyl Glycol-Propyl Glycol Place 1-2 drops into both eyes 3 (three) times daily as needed (for dry eyes.).   valsartan-hydrochlorothiazide 160-25 MG tablet Commonly known as:  DIOVAN-HCT Take 1 tablet by mouth daily.   VICTOZA 18 MG/3ML Sopn Generic drug:  liraglutide Inject 1.8 mg into the skin at bedtime.   Vitamin D-3 5000 units Tabs Take 5,000 Units by mouth daily.   VOLTAREN 1 % Gel Generic drug:  diclofenac sodium Apply 2 g topically daily as needed (pain).   XTAMPZA ER 18 MG C12a Generic drug:  oxyCODONE ER Take 18 mg by mouth every 12 (twelve) hours as needed (pain).        Signed: Eren Puebla P 08/23/2018, 6:39 AM

## 2018-08-23 NOTE — Progress Notes (Signed)
Patient alert and oriented, mae's well, voiding adequate amount of urine, swallowing without difficulty, c/o mild pain at time of discharge. Patient discharged home with family. Script and discharged instructions given to patient. Patient and family stated understanding of instructions given. Patient has an appointment with Dr. Jenkins 

## 2018-08-23 NOTE — Discharge Instructions (Signed)
Wound Care Keep incision covered and dry for two days.  It's okay to shower Do not put any creams, lotions, or ointments on incision. Leave steri-strips on back.  They will fall off by themselves. Activity Walk each and every day, increasing distance each day. No lifting greater than 5 lbs.  Avoid excessive neck motion. No driving for 2 weeks; may ride as a passenger locally. If provided with back brace, wear when out of bed.  It is not necessary to wear brace in bed. Diet Resume your normal diet.  Return to Work Will be discussed at you follow up appointment. Call Your Doctor If Any of These Occur Redness, drainage, or swelling at the wound.  Temperature greater than 101 degrees. Severe pain not relieved by pain medication. Incision starts to come apart. Follow Up Appt Call today for appointment in 3 weeks ([phone]) or for problems.  If you have any hardware placed in your spine, you will need an x-ray before your appointment.

## 2018-08-27 MED FILL — Heparin Sodium (Porcine) Inj 1000 Unit/ML: INTRAMUSCULAR | Qty: 30 | Status: AC

## 2018-08-27 MED FILL — Sodium Chloride IV Soln 0.9%: INTRAVENOUS | Qty: 1000 | Status: AC

## 2018-08-29 ENCOUNTER — Encounter (HOSPITAL_COMMUNITY): Payer: Self-pay | Admitting: Neurosurgery

## 2018-09-07 IMAGING — MR MR LUMBAR SPINE W/O CM
4 of 5 series · 19 of 48 positions shown · non-contrast
Comparison: 04/12/2015

CLINICAL DATA: Low back pain radiating down the right leg

EXAM:
MRI LUMBAR SPINE WITHOUT CONTRAST
TECHNIQUE: Multiplanar, multisequence MR imaging of the lumbar spine was
performed. No intravenous contrast was administered.

[Series 3: T2 · sagittal · 4.0mm · 0.55mm/px · 6 of 15 slices shown (1 of 2)]
[im 1/15]
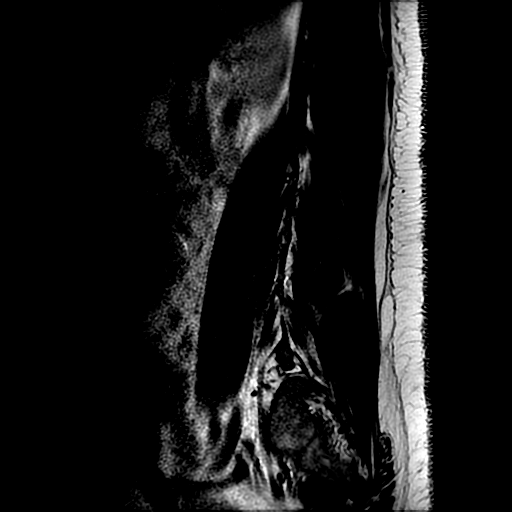
[im 3/15]
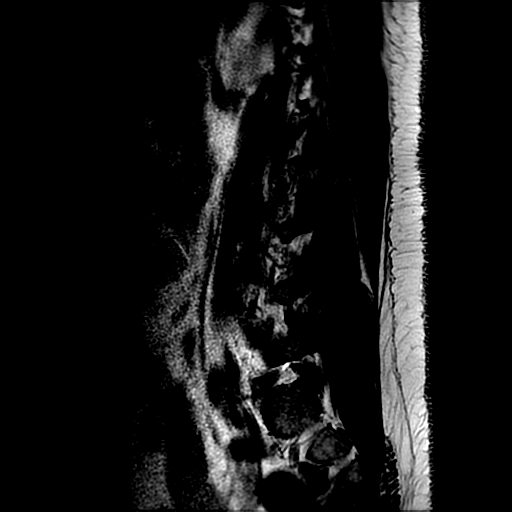
[im 6/15]
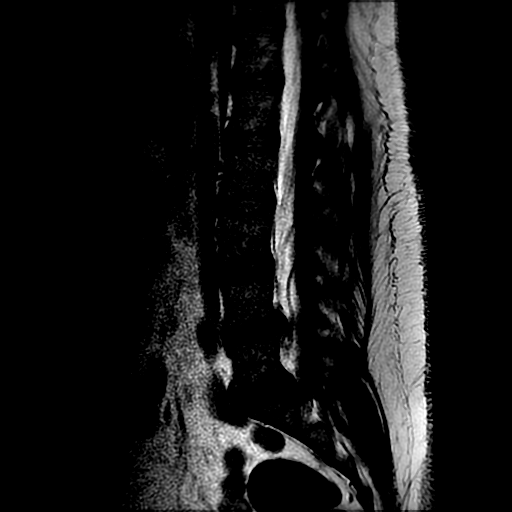
[im 9/15]
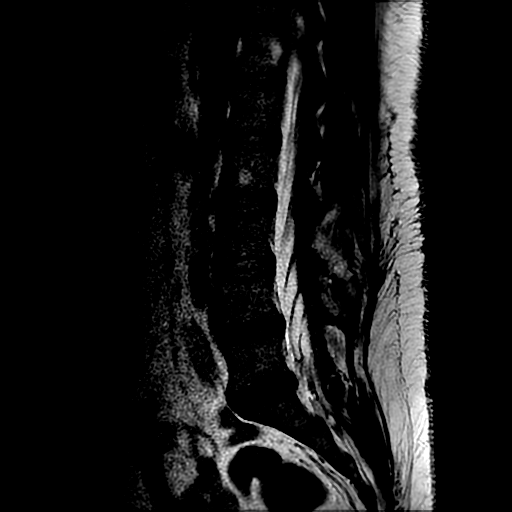
[im 12/15]
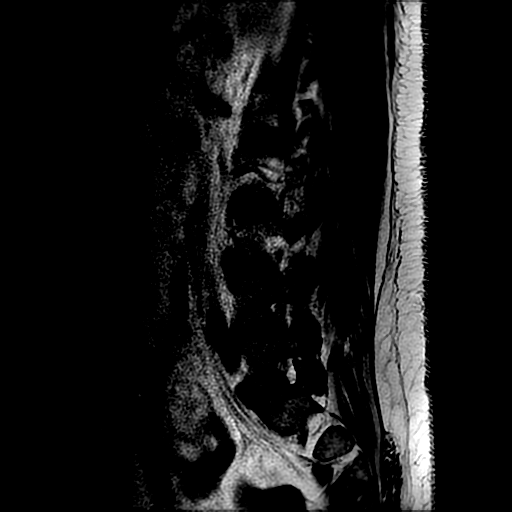
[im 15/15]
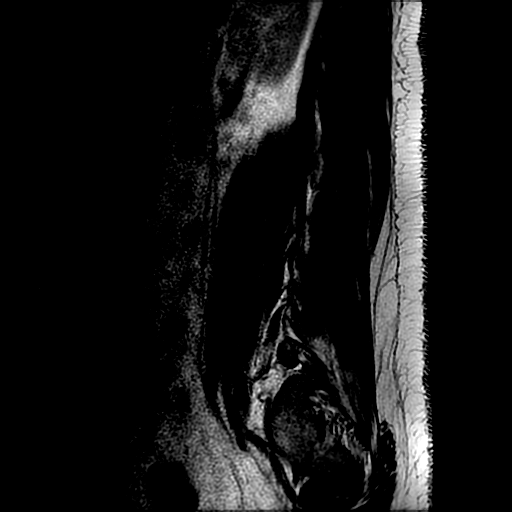

[Series 5: T1 · sagittal · 4.0mm · 0.55mm/px · 3 of 15 slices shown (1 of 2)]
[im 3/15]
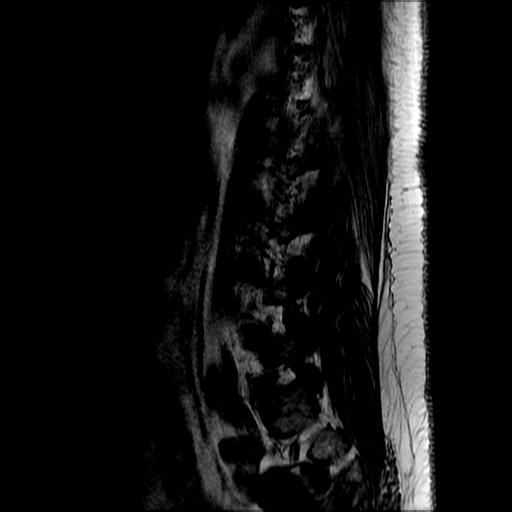
[im 9/15]
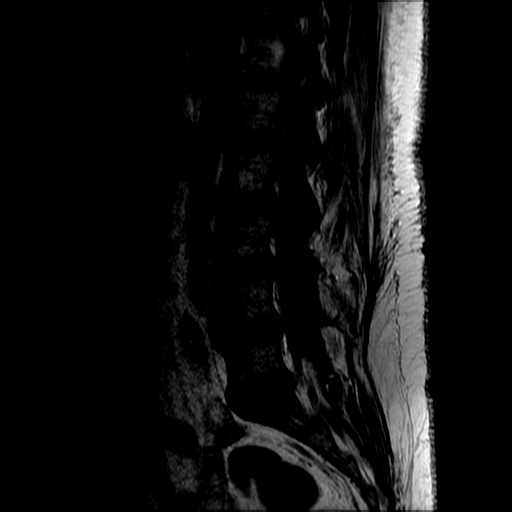
[im 15/15]
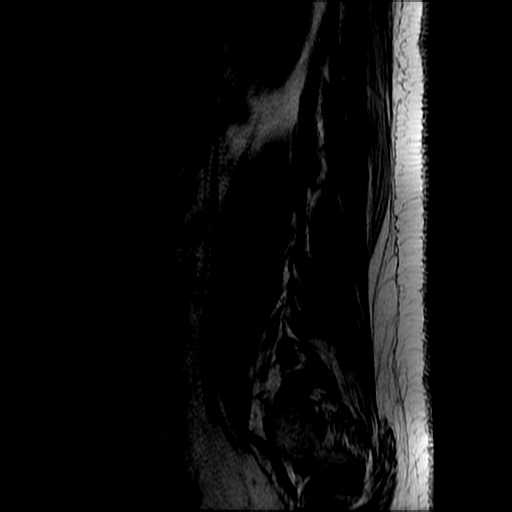

[Series 6: T2 · axial · 4.0mm · 0.39mm/px · z∈[-86,+80]mm · 7 of 35 slices shown (2 of 2)]
[im 1/35]
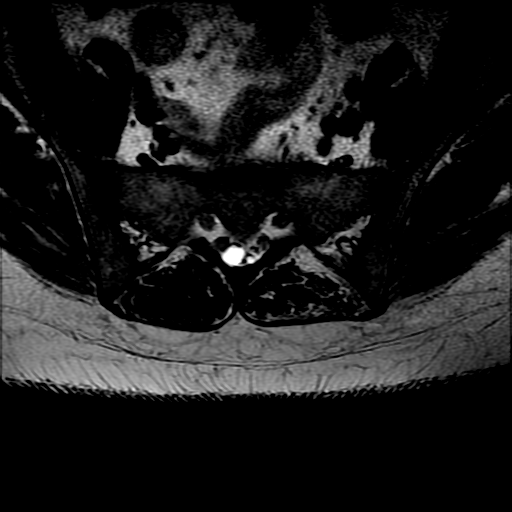
[im 5/35]
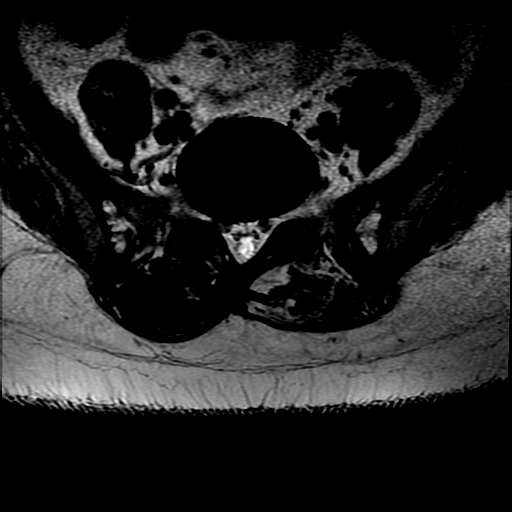
[im 10/35]
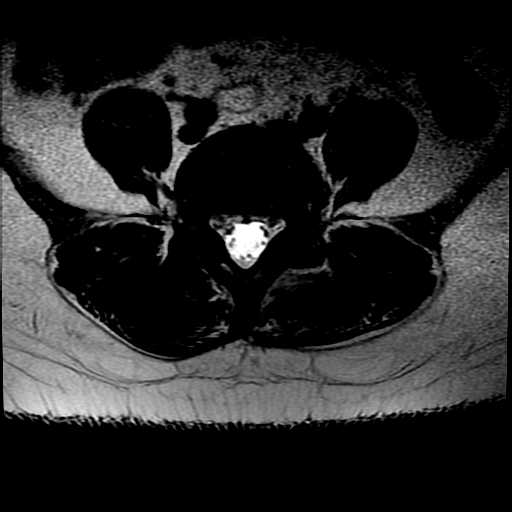
[im 15/35]
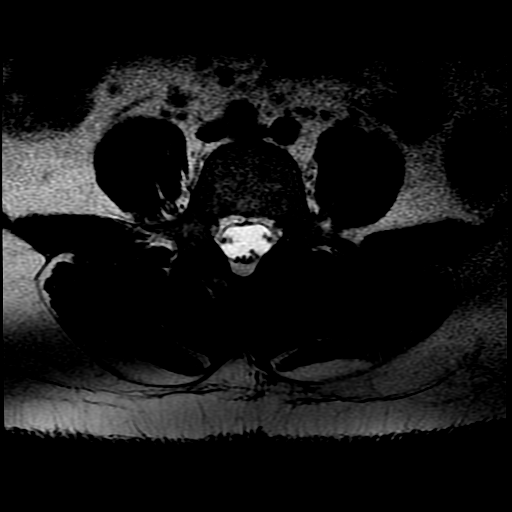
[im 18/35]
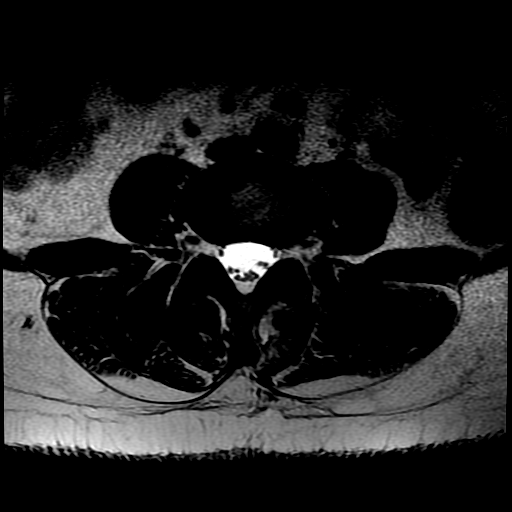
[im 20/35]
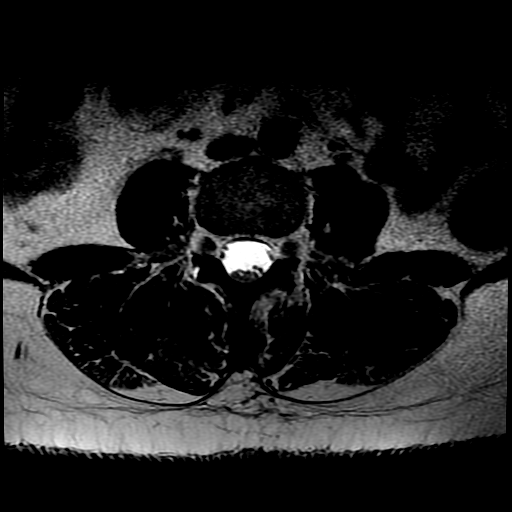
[im 30/35]
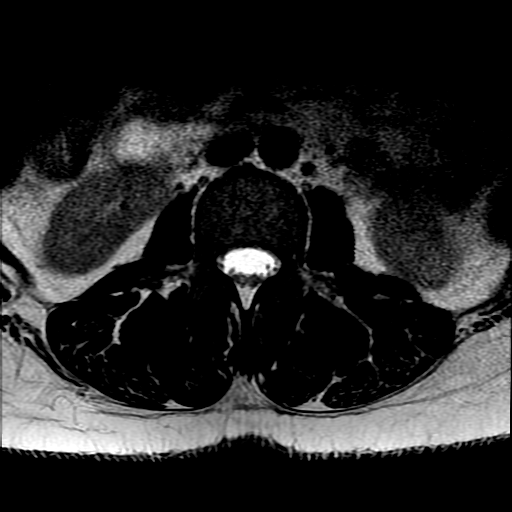

[Series 7: T1 · axial · 4.0mm · 0.39mm/px · z∈[-66,+80]mm · 3 of 35 slices shown (2 of 2)]
[im 5/35]
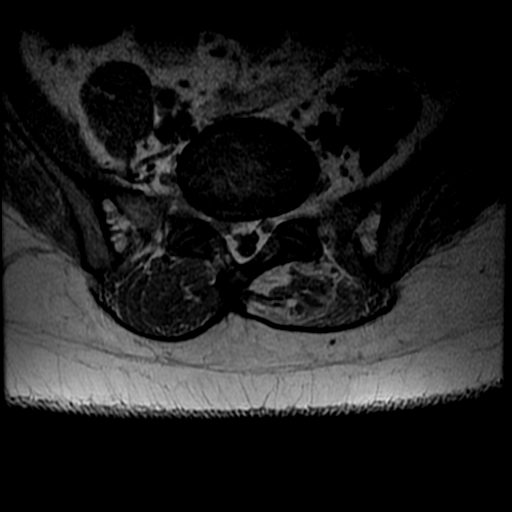
[im 18/35]
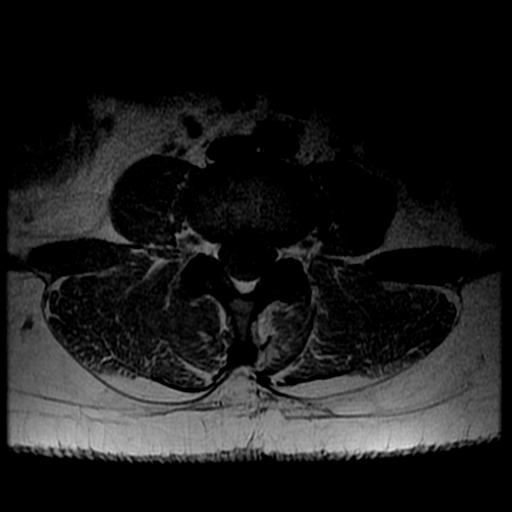
[im 30/35]
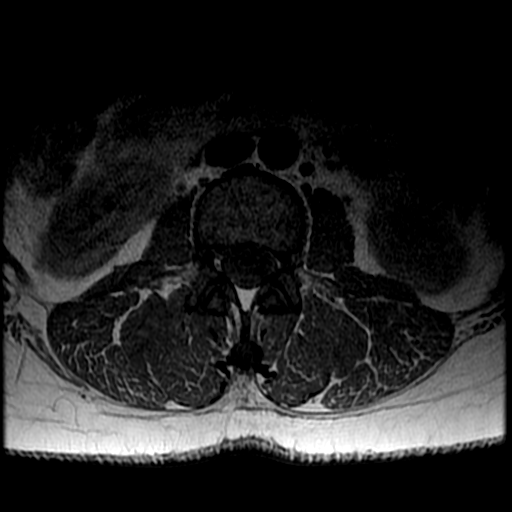

[19 of 48 positions shown; findings below may reference images not displayed]

FINDINGS: Segmentation:  Standard.

Alignment:  Physiologic.

Vertebrae:  No fracture, evidence of discitis, or bone lesion.

Conus medullaris: Extends to the T12 level and appears normal.

Paraspinal and other soft tissues: No paraspinal abnormality.

Disc levels:

Disc spaces: Degenerative disc disease with disc desiccation at L4-5
and L5-S1

T12-L1: No significant disc bulge. No evidence of neural foraminal
stenosis. No central canal stenosis.

L1-L2: No significant disc bulge. No evidence of neural foraminal
stenosis. No central canal stenosis.

L2-L3: No significant disc bulge. No evidence of neural foraminal
stenosis. No central canal stenosis.

L3-L4: No significant disc bulge. No evidence of neural foraminal
stenosis. No central canal stenosis.

L4-L5: Right paracentral disc protrusion with mild mass effect on
the right intraspinal L5 nerve root. No evidence of neural foraminal
stenosis. No central canal stenosis.

L5-S1: Right paracentral disc protrusion with inferior migration of
disc material versus 8 mm sequestered disc fragment with mass effect
on the right intraspinal S1 nerve root. No evidence of neural
foraminal stenosis. No central canal stenosis.
IMPRESSION: 1. At L4-5 there is a right paracentral disc protrusion with mild
mass effect on the right intraspinal L5 nerve root.
2. At L5-S1 there is a right paracentral disc protrusion with
inferior migration of disc material versus 8 mm sequestered disc
fragment with mass effect on the right intraspinal S1 nerve root.

## 2018-12-16 ENCOUNTER — Ambulatory Visit: Payer: Medicare Other | Admitting: Gastroenterology

## 2018-12-17 ENCOUNTER — Encounter: Payer: Self-pay | Admitting: Gastroenterology

## 2018-12-17 ENCOUNTER — Ambulatory Visit (INDEPENDENT_AMBULATORY_CARE_PROVIDER_SITE_OTHER): Payer: Medicare Other | Admitting: Gastroenterology

## 2018-12-17 VITALS — BP 141/94 | HR 77 | Temp 98.1°F | Ht 71.0 in | Wt 202.2 lb

## 2018-12-17 DIAGNOSIS — K580 Irritable bowel syndrome with diarrhea: Secondary | ICD-10-CM

## 2018-12-17 NOTE — Progress Notes (Signed)
Referring Provider: Kirstie Peri, MD Primary Care Physician:  Kirstie Peri, MD Primary GI: Dr. Jena Gauss   Chief Complaint  Patient presents with  . Diarrhea  . Abdominal Pain    HPI:   [name] is a 53 y.o. female presenting today with a history of GERD, globus sensation, dysphagia, IBS, abdominal pain. EGD and colonoscopy last year on file. When seen March 2019, noted epigastric and RUQ Pain. US abdomen with fatty liver. Recommending HIDA if persistent pain.   IBS: diarrhea predominant. Celiac serologies negative in past. Colonoscopy up-to-date. Bentyl provided at last visit but not helpful. Postprandial watery stool. Has urgency. Intermittent cramping even when doesn't go. Spaghetti causes a lot of diarrhea. Bad days outnumber the good. Milk, cheese causes diarrhea. Actively trying to lose weight, eat healthy, exercise.   Abdominal pain: at baseline. No postprandial RUQ pain or N/V.   Past Medical History:  Diagnosis Date  . Arthritis   . BMI (body mass index) 20.0-29.9 2008 192 lbs  . Complication of anesthesia    post op N/V  . Depression   . Diabetes mellitus   . Fibromyalgia   . GERD (gastroesophageal reflux disease) 2002   202 lbs  . Hypertension   . Irritable bowel syndrome 2008   diarrhea predominant MEDS TRIED: BENTYL, LEVBID  . LBP (low back pain)   . PONV (postoperative nausea and vomiting)   . PTSD (post-traumatic stress disorder)   . RUQ abdominal pain 2004 HIDA NL CTA w/ IVC-FATTY LIVER, ANGIOMYOLIPOMA LEFT KIDNEY   2008 HIDA NL    Past Surgical History:  Procedure Laterality Date  . ANKLE SURGERY  RIGHT  . BREAST REDUCTION SURGERY  2006  . COLONOSCOPY  2008 RMR   PAN-COLONIC TICS, NL TI  . COLONOSCOPY  2013   Dr. Teena Dunk: diverticulum in ascending colon, mild diverticulosis in sigmoid. Recommended 5 year surveillance  . COLONOSCOPY WITH PROPOFOL N/A 05/12/2018   Dr. Jena Gauss: pancolonic diverticulosis, otherwise normal  . ESOPHAGEAL DILATION N/A  04/06/2015   Procedure: ESOPHAGEAL DILATION;  Surgeon: Corbin Ade, MD;  [facility]: [facility];  Service: Gastroenterology;  Laterality: N/A;  300  . ESOPHAGOGASTRODUODENOSCOPY N/A 04/06/2015   RMR: Normal EGD status post Maloney dilation of teh esophagus as described above  . ESOPHAGOGASTRODUODENOSCOPY (EGD) WITH PROPOFOL N/A 05/12/2018   Dr. Jena Gauss: normal esophagus s/p dilation, normal stomach, normal duodenum  . LAPAROSCOPIC BILATERAL SALPINGO OOPHERECTOMY Bilateral 11/22/2015   Procedure: LAPAROSCOPIC BILATERAL SALPINGO OOPHORECTOMY;  Surgeon: Tilda Burrow, MD;  Location: [facility];  Service: Gynecology;  Laterality: Bilateral;  . LUMBAR DISC SURGERY  2005   x2  . MALONEY DILATION N/A 05/12/2018   Procedure: Elease Hashimoto DILATION;  Surgeon: Corbin Ade, MD;  Location: [facility];  Service: Endoscopy;  Laterality: N/A;  . SHOULDER SURGERY  1997 RIGHT  . TOTAL ABDOMINAL HYSTERECTOMY  2000 fibroids/DUB  . UPPER GASTROINTESTINAL ENDOSCOPY  2008 RMR   SML HH o/w NL  . vocal cord polyp removal  2013   Dr. Josephina Shih    Current Outpatient Medications  Medication Sig Dispense Refill  . aspirin 81 MG tablet Take 81 mg by mouth daily.     Marland Kitchen atorvastatin (LIPITOR) 10 MG tablet Take 10 mg by mouth daily.     Marland Kitchen buPROPion (WELLBUTRIN XL) 150 MG 24 hr tablet Take 150 mg by mouth daily.  3  . busPIRone (BUSPAR) 15 MG tablet Take 15 mg by mouth 3 (three) times daily.  4  . Cholecalciferol (VITAMIN D-3) 5000 units TABS Take 5,000 Units by mouth daily.    . COMBIVENT RESPIMAT 20-100 MCG/ACT AERS respimat Inhale 2 puffs into the lungs every 6 (six) hours as needed for wheezing or shortness of breath.     . DEXILANT 60 MG capsule TAKE 1 CAPSULE BY MOUTH ONCE A DAY. 30 capsule 3  . dicyclomine (BENTYL) 10 MG capsule TAKE 1 CAPSULE(10 MG) BY MOUTH FOUR TIMES DAILY BEFORE MEALS AND AT BEDTIME (Patient taking differently: Take 10 mg by mouth 4 (four) times daily -  before meals and at bedtime. ) 120  capsule 5  . DULoxetine (CYMBALTA) 60 MG capsule Take 1 capsule (60 mg total) by mouth daily. 30 capsule 0  . fexofenadine (ALLEGRA) 180 MG tablet Take 180 mg by mouth daily.     . fluticasone (FLONASE) 50 MCG/ACT nasal spray Place 2 sprays into both nostrils daily as needed for allergies.     . furosemide (LASIX) 20 MG tablet Take 10 mg by mouth daily as needed for fluid.     Marland Kitchen insulin lispro (HUMALOG) 100 UNIT/ML KiwkPen Inject 1-4 Units into the skin 3 (three) times daily as needed for high blood sugar (for blood sugars over 150).     Marland Kitchen lidocaine (LIDODERM) 5 % Place 1 patch onto the skin daily as needed (pain). Remove & Discard patch within 12 hours or as directed by MD    . Liraglutide (VICTOZA) 18 MG/3ML SOPN Inject 1.8 mg into the skin at bedtime.     . metFORMIN (GLUCOPHAGE) 500 MG tablet Take 1,000 mg by mouth 2 (two) times daily with a meal.    . methocarbamol (ROBAXIN) 500 MG tablet Take 1 tablet by mouth 2 (two) times daily as needed for muscle spasms.   0  . methocarbamol (ROBAXIN) 500 MG tablet Take 1 tablet (500 mg total) by mouth every 6 (six) hours as needed for muscle spasms. 40 tablet 1  . metoprolol (LOPRESSOR) 100 MG tablet Take 100 mg by mouth daily.     . mirtazapine (REMERON) 15 MG tablet Take 15 mg by mouth at bedtime.    . Multiple Vitamin (MULTIVITAMIN WITH MINERALS) TABS tablet Take 1 tablet by mouth daily.    Marland Kitchen oxyCODONE-acetaminophen (PERCOCET) 10-325 MG tablet Take 1 tablet by mouth every 4 (four) hours as needed for pain.   0  . pioglitazone (ACTOS) 15 MG tablet Take 15 mg by mouth daily.  2  . Polyethyl Glycol-Propyl Glycol (SYSTANE) 0.4-0.3 % SOLN Place 1-2 drops into both eyes 3 (three) times daily as needed (for dry eyes.).    Marland Kitchen potassium chloride (K-DUR) 10 MEQ tablet Take 10 mEq by mouth daily as needed. For fluid retention with lasix use.  2  . prazosin (MINIPRESS) 1 MG capsule Take 1 mg by mouth at bedtime.  4  . Probiotic Product (PHILLIPS COLON HEALTH PO)  Take 1 capsule by mouth daily.    . valsartan-hydrochlorothiazide (DIOVAN-HCT) 160-25 MG per tablet Take 1 tablet by mouth daily.    . VOLTAREN 1 % GEL Apply 2 g topically daily as needed (pain).      No current facility-administered medications for this visit.     Allergies as of 12/17/2018 - Review Complete 12/17/2018  Allergen Reaction Noted  . Neurontin [gabapentin] Other (See Comments) 03/24/2013    Family History  Problem Relation Age of Onset  . Breast cancer Maternal Aunt   . Anxiety disorder Maternal Aunt   .  Anxiety disorder Mother   . Stroke Mother   . COPD Mother   . Hypertension Mother   . Thyroid disease Mother   . Diabetes Mother   . Drug abuse Daughter   . Anxiety disorder Daughter   . Seizures Sister   . Alcohol abuse Maternal Uncle   . Mental illness Cousin   . Dementia Maternal Aunt   . Anxiety disorder Sister   . Stroke Sister   . Anxiety disorder Sister   . Drug abuse Sister   . Early death Sister   . Heart disease Sister   . Heart attack Sister   . Stroke Maternal Grandmother   . Colon cancer Maternal Aunt        in her 56s  . Pancreatic cancer Maternal Aunt        great aunt  . Colon polyps Neg Hx   . ADD / ADHD Neg Hx   . Bipolar disorder Neg Hx   . Depression Neg Hx   . OCD Neg Hx   . Paranoid behavior Neg Hx   . Schizophrenia Neg Hx   . Sexual abuse Neg Hx   . Physical abuse Neg Hx     Social History   Socioeconomic History  . Marital status: Single    Spouse name: Not on file  . Number of children: 3  . Years of education: 12th  . Highest education level: Not on file  Occupational History    Employer: RETIRED    Comment: [employer]  Social Needs  . Financial resource strain: Not on file  . Food insecurity:    Worry: Not on file    Inability: Not on file  . Transportation needs:    Medical: Not on file    Non-medical: Not on file  Tobacco Use  . Smoking status: Former Smoker    Packs/day: 0.50    Years: 2.00    Pack  years: 1.00    Types: Cigarettes  . Smokeless tobacco: Never Used  Substance and Sexual Activity  . Alcohol use: No    Alcohol/week: 0.0 standard drinks  . Drug use: No  . Sexual activity: Yes    Birth control/protection: Surgical  Lifestyle  . Physical activity:    Days per week: Not on file    Minutes per session: Not on file  . Stress: Not on file  Relationships  . Social connections:    Talks on phone: Not on file    Gets together: Not on file    Attends religious service: Not on file    Active member of club or organization: Not on file    Attends meetings of clubs or organizations: Not on file    Relationship status: Not on file  Other Topics Concern  . Not on file  Social History Narrative   Patient lives at home with her family. Raising two grand daughters, daughter passed away 19-Apr-2016   Caffeine Use: 2 cups of coffee daily    Review of Systems: Gen: Denies fever, chills, anorexia. Denies fatigue, weakness, weight loss.  CV: Denies chest pain, palpitations, syncope, peripheral edema, and claudication. Resp: Denies dyspnea at rest, cough, wheezing, coughing up blood, and pleurisy. GI: see HPI Derm: Denies rash, itching, dry skin Psych: Denies depression, anxiety, memory loss, confusion. No homicidal or suicidal ideation.  Heme: Denies bruising, bleeding, and enlarged lymph nodes.  Physical Exam: BP (!) 141/94   Pulse 77   Temp 98.1 F (36.7 C) (Oral)  Ht 5\' 11"  (1.803 m)   Wt 202 lb 3.2 oz (91.7 kg)   BMI 28.20 kg/m  General:   Alert and oriented. No distress noted. Pleasant and cooperative.  Head:  Normocephalic and atraumatic. Eyes:  Conjuctiva clear without scleral icterus. Mouth:  Oral mucosa pink and moist.  Abdomen:  +BS, soft, non-tender and non-distended. No rebound or guarding. No HSM or masses noted. Msk:  Symmetrical without gross deformities. Normal posture. Extremities:  Without edema. Neurologic:  Alert and  oriented x4 Psych:  Alert and  cooperative. Normal mood and affect.

## 2018-12-17 NOTE — Patient Instructions (Signed)
I would like for you to stop Bentyl.  Instead, we are trying Viberzi 1 tablet twice a day with food. Call if you have abdominal pain, nausea, and vomiting after taking.   Let me know how this works for you. If it does well, I will send in a prescription. If not, we will try a 2 week course of a medication called Xifaxan.  Will see you in 3 months regardless!  Keep up the good work with diet and exercise!  I enjoyed seeing you again today! As you know, I value our relationship and want to provide genuine, compassionate, and quality care. I welcome your feedback. If you receive a survey regarding your visit,  I greatly appreciate you taking time to fill this out. See you next time!  Gelene Mink, PhD, ANP-BC [facility]

## 2018-12-17 NOTE — Progress Notes (Signed)
CC'D TO PCP °

## 2018-12-17 NOTE — Assessment & Plan Note (Signed)
53 year old female with diarrhea-predominant IBS, without improvement on Bentyl. Dietary intake playing a role as well with likely lactose intolerance. Celiac serologies negative, colonoscopy normal on file. NO alarm features. Will trial Viberzi 75 mg po  BID. Patient to call with progress report. May need to increase to 100 mg BID. If no improvement, would give round of Xifaxan. Return in 3 months for follow-up.

## 2019-01-16 DIAGNOSIS — Z981 Arthrodesis status: Secondary | ICD-10-CM | POA: Insufficient documentation

## 2019-03-18 ENCOUNTER — Ambulatory Visit: Payer: Medicare Other | Admitting: Gastroenterology

## 2019-03-18 ENCOUNTER — Telehealth: Payer: Self-pay

## 2019-03-18 ENCOUNTER — Other Ambulatory Visit: Payer: Self-pay

## 2019-03-18 NOTE — Telephone Encounter (Signed)
Tried to call pt at 1:15pm to start Facetime visit w/AB, no answer, unable to leave VM d/t mailbox full. No return call as of 1:45pm.  Misty Stanley, please no show pt for appt.

## 2019-03-19 ENCOUNTER — Encounter: Payer: Self-pay | Admitting: Internal Medicine

## 2019-03-19 ENCOUNTER — Telehealth: Payer: Self-pay | Admitting: Internal Medicine

## 2019-03-19 NOTE — Telephone Encounter (Signed)
DONE

## 2019-03-19 NOTE — Telephone Encounter (Signed)
PATIENT WAS A NO SHOW/NO ANSWER AND LETTER SENT  °

## 2019-10-02 ENCOUNTER — Other Ambulatory Visit: Payer: Self-pay | Admitting: Neurosurgery

## 2019-10-02 DIAGNOSIS — G8929 Other chronic pain: Secondary | ICD-10-CM

## 2019-10-02 DIAGNOSIS — M542 Cervicalgia: Secondary | ICD-10-CM

## 2019-10-31 ENCOUNTER — Other Ambulatory Visit: Payer: Medicare Other

## 2019-11-13 ENCOUNTER — Other Ambulatory Visit: Payer: Self-pay | Admitting: Gastroenterology

## 2019-11-28 ENCOUNTER — Ambulatory Visit
Admission: RE | Admit: 2019-11-28 | Discharge: 2019-11-28 | Disposition: A | Discharge status: Home / Self Care | Payer: Medicare Other | Source: Ambulatory Visit | Attending: Neurosurgery | Admitting: Neurosurgery

## 2019-11-28 ENCOUNTER — Other Ambulatory Visit: Payer: Self-pay

## 2019-11-28 DIAGNOSIS — M542 Cervicalgia: Secondary | ICD-10-CM | POA: Diagnosis not present

## 2019-11-28 DIAGNOSIS — G8929 Other chronic pain: Secondary | ICD-10-CM

## 2019-11-28 DIAGNOSIS — M5441 Lumbago with sciatica, right side: Secondary | ICD-10-CM

## 2019-11-30 ENCOUNTER — Other Ambulatory Visit: Payer: Self-pay | Admitting: Neurosurgery

## 2019-11-30 DIAGNOSIS — M5441 Lumbago with sciatica, right side: Secondary | ICD-10-CM

## 2019-12-01 ENCOUNTER — Other Ambulatory Visit: Payer: Self-pay

## 2019-12-01 ENCOUNTER — Ambulatory Visit
Admission: RE | Admit: 2019-12-01 | Discharge: 2019-12-01 | Disposition: A | Discharge status: Home / Self Care | Payer: Medicare Other | Source: Ambulatory Visit | Attending: Neurosurgery | Admitting: Neurosurgery

## 2019-12-01 DIAGNOSIS — Z981 Arthrodesis status: Secondary | ICD-10-CM | POA: Diagnosis not present

## 2019-12-01 DIAGNOSIS — M5127 Other intervertebral disc displacement, lumbosacral region: Secondary | ICD-10-CM | POA: Diagnosis not present

## 2019-12-01 DIAGNOSIS — G9589 Other specified diseases of spinal cord: Secondary | ICD-10-CM | POA: Diagnosis not present

## 2019-12-01 DIAGNOSIS — M5441 Lumbago with sciatica, right side: Secondary | ICD-10-CM

## 2019-12-01 MED ORDER — GADOBENATE DIMEGLUMINE 529 MG/ML IV SOLN
20.0000 mL | Freq: Once | INTRAVENOUS | Status: AC | PRN
  Administered 2019-12-01: 20 mL via INTRAVENOUS

## 2019-12-08 DIAGNOSIS — H5213 Myopia, bilateral: Secondary | ICD-10-CM | POA: Diagnosis not present

## 2019-12-08 DIAGNOSIS — E119 Type 2 diabetes mellitus without complications: Secondary | ICD-10-CM | POA: Diagnosis not present

## 2019-12-09 DIAGNOSIS — G894 Chronic pain syndrome: Secondary | ICD-10-CM | POA: Diagnosis not present

## 2019-12-09 DIAGNOSIS — M5136 Other intervertebral disc degeneration, lumbar region: Secondary | ICD-10-CM | POA: Diagnosis not present

## 2019-12-09 DIAGNOSIS — Z79891 Long term (current) use of opiate analgesic: Secondary | ICD-10-CM | POA: Diagnosis not present

## 2019-12-09 DIAGNOSIS — M542 Cervicalgia: Secondary | ICD-10-CM | POA: Diagnosis not present

## 2019-12-09 DIAGNOSIS — Z79899 Other long term (current) drug therapy: Secondary | ICD-10-CM | POA: Diagnosis not present

## 2019-12-09 DIAGNOSIS — M5417 Radiculopathy, lumbosacral region: Secondary | ICD-10-CM | POA: Diagnosis not present

## 2019-12-14 DIAGNOSIS — E119 Type 2 diabetes mellitus without complications: Secondary | ICD-10-CM | POA: Diagnosis not present

## 2019-12-14 DIAGNOSIS — E7800 Pure hypercholesterolemia, unspecified: Secondary | ICD-10-CM | POA: Diagnosis not present

## 2019-12-24 ENCOUNTER — Other Ambulatory Visit: Payer: Medicare Other

## 2020-01-06 DIAGNOSIS — G894 Chronic pain syndrome: Secondary | ICD-10-CM | POA: Diagnosis not present

## 2020-01-06 DIAGNOSIS — M5417 Radiculopathy, lumbosacral region: Secondary | ICD-10-CM | POA: Diagnosis not present

## 2020-01-06 DIAGNOSIS — M5030 Other cervical disc degeneration, unspecified cervical region: Secondary | ICD-10-CM | POA: Diagnosis not present

## 2020-01-06 DIAGNOSIS — M5136 Other intervertebral disc degeneration, lumbar region: Secondary | ICD-10-CM | POA: Diagnosis not present

## 2020-01-18 DIAGNOSIS — E119 Type 2 diabetes mellitus without complications: Secondary | ICD-10-CM | POA: Diagnosis not present

## 2020-01-18 DIAGNOSIS — E7800 Pure hypercholesterolemia, unspecified: Secondary | ICD-10-CM | POA: Diagnosis not present

## 2020-01-20 DIAGNOSIS — M47812 Spondylosis without myelopathy or radiculopathy, cervical region: Secondary | ICD-10-CM | POA: Diagnosis not present

## 2020-01-20 DIAGNOSIS — M5030 Other cervical disc degeneration, unspecified cervical region: Secondary | ICD-10-CM | POA: Diagnosis not present

## 2020-01-28 DIAGNOSIS — Z1231 Encounter for screening mammogram for malignant neoplasm of breast: Secondary | ICD-10-CM | POA: Diagnosis not present

## 2020-01-29 DIAGNOSIS — Z299 Encounter for prophylactic measures, unspecified: Secondary | ICD-10-CM | POA: Diagnosis not present

## 2020-01-29 DIAGNOSIS — I10 Essential (primary) hypertension: Secondary | ICD-10-CM | POA: Diagnosis not present

## 2020-01-29 DIAGNOSIS — E1142 Type 2 diabetes mellitus with diabetic polyneuropathy: Secondary | ICD-10-CM | POA: Diagnosis not present

## 2020-01-29 DIAGNOSIS — E1165 Type 2 diabetes mellitus with hyperglycemia: Secondary | ICD-10-CM | POA: Diagnosis not present

## 2020-02-17 DIAGNOSIS — N6489 Other specified disorders of breast: Secondary | ICD-10-CM | POA: Diagnosis not present

## 2020-02-17 DIAGNOSIS — R928 Other abnormal and inconclusive findings on diagnostic imaging of breast: Secondary | ICD-10-CM | POA: Diagnosis not present

## 2020-02-22 DIAGNOSIS — G894 Chronic pain syndrome: Secondary | ICD-10-CM | POA: Diagnosis not present

## 2020-02-22 DIAGNOSIS — M5417 Radiculopathy, lumbosacral region: Secondary | ICD-10-CM | POA: Diagnosis not present

## 2020-02-22 DIAGNOSIS — Z79899 Other long term (current) drug therapy: Secondary | ICD-10-CM | POA: Diagnosis not present

## 2020-02-22 DIAGNOSIS — Z79891 Long term (current) use of opiate analgesic: Secondary | ICD-10-CM | POA: Diagnosis not present

## 2020-02-22 DIAGNOSIS — M5136 Other intervertebral disc degeneration, lumbar region: Secondary | ICD-10-CM | POA: Diagnosis not present

## 2020-02-22 DIAGNOSIS — M5030 Other cervical disc degeneration, unspecified cervical region: Secondary | ICD-10-CM | POA: Diagnosis not present

## 2020-02-26 DIAGNOSIS — E7800 Pure hypercholesterolemia, unspecified: Secondary | ICD-10-CM | POA: Diagnosis not present

## 2020-02-26 DIAGNOSIS — E119 Type 2 diabetes mellitus without complications: Secondary | ICD-10-CM | POA: Diagnosis not present

## 2020-03-21 DIAGNOSIS — M961 Postlaminectomy syndrome, not elsewhere classified: Secondary | ICD-10-CM | POA: Diagnosis not present

## 2020-03-21 DIAGNOSIS — M5136 Other intervertebral disc degeneration, lumbar region: Secondary | ICD-10-CM | POA: Diagnosis not present

## 2020-03-21 DIAGNOSIS — G894 Chronic pain syndrome: Secondary | ICD-10-CM | POA: Diagnosis not present

## 2020-03-21 DIAGNOSIS — R6889 Other general symptoms and signs: Secondary | ICD-10-CM | POA: Diagnosis not present

## 2020-03-21 DIAGNOSIS — M79604 Pain in right leg: Secondary | ICD-10-CM | POA: Diagnosis not present

## 2020-04-17 DIAGNOSIS — E7800 Pure hypercholesterolemia, unspecified: Secondary | ICD-10-CM | POA: Diagnosis not present

## 2020-04-17 DIAGNOSIS — E119 Type 2 diabetes mellitus without complications: Secondary | ICD-10-CM | POA: Diagnosis not present

## 2020-04-25 DIAGNOSIS — M961 Postlaminectomy syndrome, not elsewhere classified: Secondary | ICD-10-CM | POA: Diagnosis not present

## 2020-04-25 DIAGNOSIS — M5136 Other intervertebral disc degeneration, lumbar region: Secondary | ICD-10-CM | POA: Diagnosis not present

## 2020-04-25 DIAGNOSIS — M5417 Radiculopathy, lumbosacral region: Secondary | ICD-10-CM | POA: Diagnosis not present

## 2020-04-25 DIAGNOSIS — G894 Chronic pain syndrome: Secondary | ICD-10-CM | POA: Diagnosis not present

## 2020-05-06 DIAGNOSIS — I10 Essential (primary) hypertension: Secondary | ICD-10-CM | POA: Diagnosis not present

## 2020-05-06 DIAGNOSIS — R809 Proteinuria, unspecified: Secondary | ICD-10-CM | POA: Diagnosis not present

## 2020-05-06 DIAGNOSIS — E1165 Type 2 diabetes mellitus with hyperglycemia: Secondary | ICD-10-CM | POA: Diagnosis not present

## 2020-05-06 DIAGNOSIS — Z794 Long term (current) use of insulin: Secondary | ICD-10-CM | POA: Diagnosis not present

## 2020-05-06 DIAGNOSIS — E1129 Type 2 diabetes mellitus with other diabetic kidney complication: Secondary | ICD-10-CM | POA: Diagnosis not present

## 2020-05-06 DIAGNOSIS — Z299 Encounter for prophylactic measures, unspecified: Secondary | ICD-10-CM | POA: Diagnosis not present

## 2020-05-09 ENCOUNTER — Other Ambulatory Visit: Payer: Self-pay | Admitting: Gastroenterology

## 2020-05-10 DIAGNOSIS — E1129 Type 2 diabetes mellitus with other diabetic kidney complication: Secondary | ICD-10-CM | POA: Diagnosis not present

## 2020-05-16 ENCOUNTER — Ambulatory Visit: Payer: Medicare Other | Admitting: Orthopedic Surgery

## 2020-05-18 DIAGNOSIS — E119 Type 2 diabetes mellitus without complications: Secondary | ICD-10-CM | POA: Diagnosis not present

## 2020-05-18 DIAGNOSIS — E7800 Pure hypercholesterolemia, unspecified: Secondary | ICD-10-CM | POA: Diagnosis not present

## 2020-05-19 ENCOUNTER — Encounter: Payer: Self-pay | Admitting: Orthopedic Surgery

## 2020-05-19 ENCOUNTER — Ambulatory Visit (INDEPENDENT_AMBULATORY_CARE_PROVIDER_SITE_OTHER): Payer: Medicare Other | Admitting: Orthopedic Surgery

## 2020-05-19 ENCOUNTER — Ambulatory Visit: Payer: Medicare Other

## 2020-05-19 ENCOUNTER — Other Ambulatory Visit: Payer: Self-pay

## 2020-05-19 VITALS — BP 125/78 | HR 78 | Ht 71.0 in | Wt 198.0 lb

## 2020-05-19 DIAGNOSIS — G8929 Other chronic pain: Secondary | ICD-10-CM

## 2020-05-19 DIAGNOSIS — M25562 Pain in left knee: Secondary | ICD-10-CM

## 2020-05-19 DIAGNOSIS — M25561 Pain in right knee: Secondary | ICD-10-CM

## 2020-05-19 NOTE — Progress Notes (Signed)
Chief Complaint  Patient presents with  . Knee Pain    Right knee pain     54 chronic pain left knee   Lateral patellofemoral pain and catching sensation;  Left knee.  The patient denies any history of trauma.  Review of systems she has chronic back pain chronic pain syndrome no fever no evidence of rash or swelling of the left knee    Past Medical History:  Diagnosis Date  . Arthritis   . BMI (body mass index) 20.0-29.9 2008 192 lbs  . Complication of anesthesia    post op N/V  . Depression   . Diabetes mellitus   . Fibromyalgia   . GERD (gastroesophageal reflux disease) 2002   202 lbs  . Hypertension   . Irritable bowel syndrome 2008   diarrhea predominant MEDS TRIED: BENTYL, LEVBID  . LBP (low back pain)   . PONV (postoperative nausea and vomiting)   . PTSD (post-traumatic stress disorder)   . RUQ abdominal pain 2004 HIDA NL CTA w/ IVC-FATTY LIVER, ANGIOMYOLIPOMA LEFT KIDNEY   2008 HIDA NL    Past Surgical History:  Procedure Laterality Date  . ANKLE SURGERY  RIGHT  . BREAST REDUCTION SURGERY  2006  . COLONOSCOPY  2008 RMR   PAN-COLONIC TICS, NL TI  . COLONOSCOPY  2013   Dr. Teena Dunk: diverticulum in ascending colon, mild diverticulosis in sigmoid. Recommended 5 year surveillance  . COLONOSCOPY WITH PROPOFOL N/A 05/12/2018   Dr. Jena Gauss: pancolonic diverticulosis, otherwise normal  . ESOPHAGEAL DILATION N/A 04/06/2015   Procedure: ESOPHAGEAL DILATION;  Surgeon: Corbin Ade, MD;  [facility]: [facility];  Service: Gastroenterology;  Laterality: N/A;  300  . ESOPHAGOGASTRODUODENOSCOPY N/A 04/06/2015   RMR: Normal EGD status post Maloney dilation of teh esophagus as described above  . ESOPHAGOGASTRODUODENOSCOPY (EGD) WITH PROPOFOL N/A 05/12/2018   Dr. Jena Gauss: normal esophagus s/p dilation, normal stomach, normal duodenum  . LAPAROSCOPIC BILATERAL SALPINGO OOPHERECTOMY Bilateral 11/22/2015   Procedure: LAPAROSCOPIC BILATERAL SALPINGO OOPHORECTOMY;  Surgeon: Tilda Burrow, MD;  Location: [facility];  Service: Gynecology;  Laterality: Bilateral;  . LUMBAR DISC SURGERY  2005   x2  . MALONEY DILATION N/A 05/12/2018   Procedure: Elease Hashimoto DILATION;  Surgeon: Corbin Ade, MD;  [facility]: [facility];  Service: Endoscopy;  Laterality: N/A;  . SHOULDER SURGERY  1997 RIGHT  . TOTAL ABDOMINAL HYSTERECTOMY  2000 fibroids/DUB  . UPPER GASTROINTESTINAL ENDOSCOPY  2008 RMR   SML HH o/w NL  . vocal cord polyp removal  2013   Dr. Josephina Shih    BP 125/78   Pulse 78   Ht 5\' 11"  (1.803 m)   Wt 198 lb (89.8 kg)   BMI 27.62 kg/m   Well-developed well-nourished black female awake alert and oriented x3 mood and affect normal gait and station normal  Tends to have a knot kneed appearance to the both knees  Left knee tenderness in the patellofemoral joint and the lateral side somewhat hypermobile patellae crepitance on range of motion positive quadriceps contraction test tenderness on the lateral facet  She exhibits full range of motion with a stable knee muscle tone and strength are normal neurovascular exam is intact skin is normal without rash or erythema  Right lower extremity has similar exam with mild tenderness and patellofemoral joint full range of motion no tenderness otherwise normal range of motion no instability normal muscle tone and strength skin neurovascular exam intact  Images of the right and left knee show valgus  appearance to the right and left knee with very minimal changes in the tibiofemoral joints but patellofemoral joint shows lateral edge loading bilaterally  Encounter Diagnoses  Name Primary?  . Chronic pain of right knee Yes  . Chronic pain of left knee     Recommend serial injections will try 3 injections 3 months apart if no improvement then we will try hyaluronic acid injection patient amenable we will add a brace to help stabilize left patella  Procedure note left knee injection   verbal consent was obtained to inject left knee  joint  Timeout was completed to confirm the site of injection  The medications used were 40 mg of Depo-Medrol and 1% lidocaine 3 cc  Anesthesia was provided by ethyl chloride and the skin was prepped with alcohol.  After cleaning the skin with alcohol a 20-gauge needle was used to inject the left knee joint. There were no complications. A sterile bandage was applied.  Coding: Chronic pain with exacerbation Injection Level 4

## 2020-05-19 NOTE — Patient Instructions (Signed)

## 2020-05-31 DIAGNOSIS — G894 Chronic pain syndrome: Secondary | ICD-10-CM | POA: Diagnosis not present

## 2020-05-31 DIAGNOSIS — M5136 Other intervertebral disc degeneration, lumbar region: Secondary | ICD-10-CM | POA: Diagnosis not present

## 2020-05-31 DIAGNOSIS — Z79899 Other long term (current) drug therapy: Secondary | ICD-10-CM | POA: Diagnosis not present

## 2020-05-31 DIAGNOSIS — M25569 Pain in unspecified knee: Secondary | ICD-10-CM | POA: Diagnosis not present

## 2020-05-31 DIAGNOSIS — Z79891 Long term (current) use of opiate analgesic: Secondary | ICD-10-CM | POA: Diagnosis not present

## 2020-05-31 DIAGNOSIS — M79606 Pain in leg, unspecified: Secondary | ICD-10-CM | POA: Diagnosis not present

## 2020-06-16 ENCOUNTER — Encounter: Payer: Self-pay | Admitting: Orthopedic Surgery

## 2020-06-16 ENCOUNTER — Ambulatory Visit (INDEPENDENT_AMBULATORY_CARE_PROVIDER_SITE_OTHER): Payer: Medicare Other | Admitting: Orthopedic Surgery

## 2020-06-16 ENCOUNTER — Other Ambulatory Visit: Payer: Self-pay

## 2020-06-16 ENCOUNTER — Ambulatory Visit: Payer: Medicare Other

## 2020-06-16 VITALS — BP 161/90 | HR 74 | Ht 71.0 in | Wt 200.0 lb

## 2020-06-16 DIAGNOSIS — M7541 Impingement syndrome of right shoulder: Secondary | ICD-10-CM

## 2020-06-16 DIAGNOSIS — M25511 Pain in right shoulder: Secondary | ICD-10-CM | POA: Diagnosis not present

## 2020-06-16 DIAGNOSIS — G8929 Other chronic pain: Secondary | ICD-10-CM

## 2020-06-16 MED ORDER — MELOXICAM 7.5 MG PO TABS: 7.5000 mg | ORAL_TABLET | Freq: Every day | ORAL | 5 refills | Status: DC

## 2020-06-16 NOTE — Progress Notes (Signed)
Chief Complaint  Patient presents with  . Shoulder Pain    right/ over a year, worse in past 6 months     54 year old female has had pain in her right shoulder for 6 months she status post 2 surgeries one arthroscopic 1 open many years ago.  Complains of anterior shoulder pain radiating down to the right elbow and associated with painful forward elevation abduction decreased range of motion  Review of Systems  Constitutional: Negative for fever.  Musculoskeletal: Positive for joint pain.  Neurological: Negative for tingling.     Past Medical History:  Diagnosis Date  . Arthritis   . BMI (body mass index) 20.0-29.9 2008 192 lbs  . Complication of anesthesia    post op N/V  . Depression   . Diabetes mellitus   . Fibromyalgia   . GERD (gastroesophageal reflux disease) 2002   202 lbs  . Hypertension   . Irritable bowel syndrome 2008   diarrhea predominant MEDS TRIED: BENTYL, LEVBID  . LBP (low back pain)   . PONV (postoperative nausea and vomiting)   . PTSD (post-traumatic stress disorder)   . RUQ abdominal pain 2004 HIDA NL CTA w/ IVC-FATTY LIVER, ANGIOMYOLIPOMA LEFT KIDNEY   2008 HIDA NL   Past Surgical History:  Procedure Laterality Date  . ANKLE SURGERY  RIGHT  . BREAST REDUCTION SURGERY  2006  . COLONOSCOPY  2008 RMR   PAN-COLONIC TICS, NL TI  . COLONOSCOPY  2013   Dr. Teena Dunk: diverticulum in ascending colon, mild diverticulosis in sigmoid. Recommended 5 year surveillance  . COLONOSCOPY WITH PROPOFOL N/A 05/12/2018   Dr. Jena Gauss: pancolonic diverticulosis, otherwise normal  . ESOPHAGEAL DILATION N/A 04/06/2015   Procedure: ESOPHAGEAL DILATION;  Surgeon: Corbin Ade, MD;  [facility];  Service: Gastroenterology;  Laterality: N/A;  300  . ESOPHAGOGASTRODUODENOSCOPY N/A 04/06/2015   RMR: Normal EGD status post Maloney dilation of teh esophagus as described above  . ESOPHAGOGASTRODUODENOSCOPY (EGD) WITH PROPOFOL N/A 05/12/2018   Dr. Jena Gauss: normal esophagus s/p  dilation, normal stomach, normal duodenum  . LAPAROSCOPIC BILATERAL SALPINGO OOPHERECTOMY Bilateral 11/22/2015   Procedure: LAPAROSCOPIC BILATERAL SALPINGO OOPHORECTOMY;  Surgeon: Tilda Burrow, MD;  Location: [facility];  Service: Gynecology;  Laterality: Bilateral;  . LUMBAR DISC SURGERY  2005   x2  . MALONEY DILATION N/A 05/12/2018   Procedure: Elease Hashimoto DILATION;  Surgeon: Corbin Ade, MD;  [facility];  Service: Endoscopy;  Laterality: N/A;  . SHOULDER SURGERY  1997 RIGHT  . TOTAL ABDOMINAL HYSTERECTOMY  2000 fibroids/DUB  . UPPER GASTROINTESTINAL ENDOSCOPY  2008 RMR   SML HH o/w NL  . vocal cord polyp removal  2013   Dr. Josephina Shih    BP (!) 161/90   Pulse 74   Ht 5\' 11"  (1.803 m)   Wt 200 lb (90.7 kg)   BMI 27.89 kg/m   Physical Exam Constitutional:      General: She is not in acute distress.    Appearance: She is well-developed.  Cardiovascular:     Comments: No peripheral edema Skin:    General: Skin is warm and dry.  Neurological:     Mental Status: She is alert and oriented to person, place, and time.     Sensory: No sensory deficit.     Coordination: Coordination normal.     Gait: Gait normal.     Deep Tendon Reflexes: Reflexes are normal and symmetric.  Psychiatric:        Mood and  Affect: Mood normal.        Thought Content: Thought content normal.        Judgment: Judgment normal.    Left shoulder no tenderness good range of motion no instability normal muscle tone  Right shoulder is tender in the posterior and lateral portion of the deltoid including the anterior portion  Her internal rotation is to L5 her external rotation is full her forward elevation passively is full with pain she has a positive impingement sign and no weakness on the rotator cuff strength test in the empty can position    DG Shoulder Right  AP and lateral right shoulder  The glenohumeral joint seems to be symmetric there appears to be sclerosis under the acromion  suggesting acromial tuberosity impingement as there is some sclerosis there as well.  The acromion looks to be a type II or III I would actually call it a 3  Impression normal glenohumeral joint with type III acromion   Encounter Diagnoses  Name Primary?  . Chronic right shoulder pain   . Impingement syndrome of right shoulder Yes    Meds ordered this encounter  Medications  . meloxicam (MOBIC) 7.5 MG tablet    Sig: Take 1 tablet (7.5 mg total) by mouth daily.    Dispense:  30 tablet    Refill:  5   Order PT    Chronic problem, injection and medication management

## 2020-06-16 NOTE — Patient Instructions (Signed)

## 2020-06-17 DIAGNOSIS — E119 Type 2 diabetes mellitus without complications: Secondary | ICD-10-CM | POA: Diagnosis not present

## 2020-06-17 DIAGNOSIS — E7800 Pure hypercholesterolemia, unspecified: Secondary | ICD-10-CM | POA: Diagnosis not present

## 2020-06-19 DIAGNOSIS — I639 Cerebral infarction, unspecified: Secondary | ICD-10-CM

## 2020-06-19 DIAGNOSIS — G459 Transient cerebral ischemic attack, unspecified: Secondary | ICD-10-CM

## 2020-06-19 HISTORY — DX: Cerebral infarction, unspecified: I63.9

## 2020-06-19 HISTORY — DX: Transient cerebral ischemic attack, unspecified: G45.9

## 2020-06-28 DIAGNOSIS — G894 Chronic pain syndrome: Secondary | ICD-10-CM | POA: Diagnosis not present

## 2020-06-28 DIAGNOSIS — M5136 Other intervertebral disc degeneration, lumbar region: Secondary | ICD-10-CM | POA: Diagnosis not present

## 2020-06-28 DIAGNOSIS — M797 Fibromyalgia: Secondary | ICD-10-CM | POA: Diagnosis not present

## 2020-06-28 DIAGNOSIS — M25569 Pain in unspecified knee: Secondary | ICD-10-CM | POA: Diagnosis not present

## 2020-07-07 DIAGNOSIS — Z20822 Contact with and (suspected) exposure to covid-19: Secondary | ICD-10-CM | POA: Diagnosis not present

## 2020-07-07 DIAGNOSIS — R202 Paresthesia of skin: Secondary | ICD-10-CM | POA: Diagnosis not present

## 2020-07-07 DIAGNOSIS — I10 Essential (primary) hypertension: Secondary | ICD-10-CM | POA: Diagnosis not present

## 2020-07-07 DIAGNOSIS — Z7902 Long term (current) use of antithrombotics/antiplatelets: Secondary | ICD-10-CM | POA: Diagnosis not present

## 2020-07-07 DIAGNOSIS — Z7982 Long term (current) use of aspirin: Secondary | ICD-10-CM | POA: Diagnosis not present

## 2020-07-07 DIAGNOSIS — I6349 Cerebral infarction due to embolism of other cerebral artery: Secondary | ICD-10-CM | POA: Diagnosis not present

## 2020-07-07 DIAGNOSIS — E1140 Type 2 diabetes mellitus with diabetic neuropathy, unspecified: Secondary | ICD-10-CM | POA: Diagnosis not present

## 2020-07-07 DIAGNOSIS — R519 Headache, unspecified: Secondary | ICD-10-CM | POA: Diagnosis not present

## 2020-07-07 DIAGNOSIS — Z7984 Long term (current) use of oral hypoglycemic drugs: Secondary | ICD-10-CM | POA: Diagnosis not present

## 2020-07-07 DIAGNOSIS — Z79899 Other long term (current) drug therapy: Secondary | ICD-10-CM | POA: Diagnosis not present

## 2020-07-08 DIAGNOSIS — R202 Paresthesia of skin: Secondary | ICD-10-CM | POA: Diagnosis not present

## 2020-07-08 DIAGNOSIS — I6340 Cerebral infarction due to embolism of unspecified cerebral artery: Secondary | ICD-10-CM | POA: Diagnosis not present

## 2020-07-08 DIAGNOSIS — I639 Cerebral infarction, unspecified: Secondary | ICD-10-CM | POA: Insufficient documentation

## 2020-07-11 DIAGNOSIS — M25611 Stiffness of right shoulder, not elsewhere classified: Secondary | ICD-10-CM | POA: Diagnosis not present

## 2020-07-11 DIAGNOSIS — M25511 Pain in right shoulder: Secondary | ICD-10-CM | POA: Diagnosis not present

## 2020-07-11 DIAGNOSIS — M6281 Muscle weakness (generalized): Secondary | ICD-10-CM | POA: Diagnosis not present

## 2020-07-11 DIAGNOSIS — E119 Type 2 diabetes mellitus without complications: Secondary | ICD-10-CM | POA: Diagnosis not present

## 2020-07-11 DIAGNOSIS — R293 Abnormal posture: Secondary | ICD-10-CM | POA: Diagnosis not present

## 2020-07-11 DIAGNOSIS — E7800 Pure hypercholesterolemia, unspecified: Secondary | ICD-10-CM | POA: Diagnosis not present

## 2020-07-11 DIAGNOSIS — M799 Soft tissue disorder, unspecified: Secondary | ICD-10-CM | POA: Diagnosis not present

## 2020-07-19 DIAGNOSIS — E1142 Type 2 diabetes mellitus with diabetic polyneuropathy: Secondary | ICD-10-CM | POA: Diagnosis not present

## 2020-07-19 DIAGNOSIS — I69359 Hemiplegia and hemiparesis following cerebral infarction affecting unspecified side: Secondary | ICD-10-CM | POA: Diagnosis not present

## 2020-07-19 DIAGNOSIS — I10 Essential (primary) hypertension: Secondary | ICD-10-CM | POA: Diagnosis not present

## 2020-07-19 DIAGNOSIS — E1165 Type 2 diabetes mellitus with hyperglycemia: Secondary | ICD-10-CM | POA: Diagnosis not present

## 2020-07-19 DIAGNOSIS — Z299 Encounter for prophylactic measures, unspecified: Secondary | ICD-10-CM | POA: Diagnosis not present

## 2020-07-28 ENCOUNTER — Ambulatory Visit: Payer: Medicare Other | Admitting: Orthopedic Surgery

## 2020-08-01 DIAGNOSIS — I2510 Atherosclerotic heart disease of native coronary artery without angina pectoris: Secondary | ICD-10-CM | POA: Diagnosis not present

## 2020-08-01 DIAGNOSIS — G459 Transient cerebral ischemic attack, unspecified: Secondary | ICD-10-CM | POA: Diagnosis not present

## 2020-08-04 DIAGNOSIS — I10 Essential (primary) hypertension: Secondary | ICD-10-CM | POA: Diagnosis not present

## 2020-08-09 DIAGNOSIS — G894 Chronic pain syndrome: Secondary | ICD-10-CM | POA: Diagnosis not present

## 2020-08-09 DIAGNOSIS — M5136 Other intervertebral disc degeneration, lumbar region: Secondary | ICD-10-CM | POA: Diagnosis not present

## 2020-08-09 DIAGNOSIS — M797 Fibromyalgia: Secondary | ICD-10-CM | POA: Diagnosis not present

## 2020-08-09 DIAGNOSIS — M25569 Pain in unspecified knee: Secondary | ICD-10-CM | POA: Diagnosis not present

## 2020-08-11 DIAGNOSIS — E261 Secondary hyperaldosteronism: Secondary | ICD-10-CM | POA: Diagnosis not present

## 2020-08-11 DIAGNOSIS — I10 Essential (primary) hypertension: Secondary | ICD-10-CM | POA: Diagnosis not present

## 2020-08-11 DIAGNOSIS — Z299 Encounter for prophylactic measures, unspecified: Secondary | ICD-10-CM | POA: Diagnosis not present

## 2020-08-11 DIAGNOSIS — E1165 Type 2 diabetes mellitus with hyperglycemia: Secondary | ICD-10-CM | POA: Diagnosis not present

## 2020-08-11 DIAGNOSIS — G43909 Migraine, unspecified, not intractable, without status migrainosus: Secondary | ICD-10-CM | POA: Diagnosis not present

## 2020-08-18 DIAGNOSIS — E119 Type 2 diabetes mellitus without complications: Secondary | ICD-10-CM | POA: Diagnosis not present

## 2020-08-18 DIAGNOSIS — E7800 Pure hypercholesterolemia, unspecified: Secondary | ICD-10-CM | POA: Diagnosis not present

## 2020-08-19 ENCOUNTER — Ambulatory Visit: Payer: Medicare Other | Admitting: Orthopedic Surgery

## 2020-08-24 ENCOUNTER — Encounter: Payer: Self-pay | Admitting: *Deleted

## 2020-08-29 ENCOUNTER — Ambulatory Visit (INDEPENDENT_AMBULATORY_CARE_PROVIDER_SITE_OTHER): Payer: Medicare Other | Admitting: Diagnostic Neuroimaging

## 2020-08-29 ENCOUNTER — Encounter: Payer: Self-pay | Admitting: Diagnostic Neuroimaging

## 2020-08-29 ENCOUNTER — Other Ambulatory Visit: Payer: Self-pay

## 2020-08-29 VITALS — BP 129/84 | HR 75 | Ht 71.0 in | Wt 199.0 lb

## 2020-08-29 DIAGNOSIS — I10 Essential (primary) hypertension: Secondary | ICD-10-CM | POA: Diagnosis not present

## 2020-08-29 DIAGNOSIS — I639 Cerebral infarction, unspecified: Secondary | ICD-10-CM

## 2020-08-29 DIAGNOSIS — E1149 Type 2 diabetes mellitus with other diabetic neurological complication: Secondary | ICD-10-CM | POA: Diagnosis not present

## 2020-08-29 DIAGNOSIS — Z794 Long term (current) use of insulin: Secondary | ICD-10-CM

## 2020-08-29 DIAGNOSIS — I6381 Other cerebral infarction due to occlusion or stenosis of small artery: Secondary | ICD-10-CM

## 2020-08-29 NOTE — Progress Notes (Signed)
[facility]  PATIENT: [name] DOB: November 11, 1966  REFERRING CLINICIAN:  HISTORY FROM: patient  REASON FOR VISIT: new consult     HISTORICAL  CHIEF COMPLAINT:  Chief Complaint  Patient presents with  . Transient Ischemic Attack    rm 7  "TIA 06/2020, still some tingling, numbness on left side"    HISTORY OF PRESENT ILLNESS:   UPDATE (08/29/20, VRP): Since last visit, was doing well until Aug 2021, had new onset left side numbness --> went to ER and dx'd with acute right thalamic ischemic infarct. Now on aspirin 81 + plavix 75 (previously on aspirin 81). Symptoms are improving. No alleviating or aggravating factors. Tolerating meds. Now DM and BP better.   UPDATE 07/03/16: Since last visit, patient has been going to [facility] for neck and low back pain. Lost to follow up here. Previous botox injections for torticollis only had temporary relief. Also, her daughter passed away in 06/29/17and now patient has to take care of her daughter's 2 surviving daughter (age 71 and 6). Stress levels are very high.  UPDATE 09/17/13: And patient returns for further evaluation of headaches and increasing neck pain. Patient has headache associated with nausea, photophobia and blurred vision. She also has left-sided neck pain. She has felt her head turning towards the right sided tilting towards the right side. She has even resorted to using a neck tie, height around her head, attached to the wall and using this to stretch her neck towards the left side. Now patient tells me that she was diagnosed with torticollis several years ago and treated with Botox injections by a neurologist in [place], [place]. Patient had MRI of the neck last month which showed minimal disc bulging but no surgically treatable lesion.  UPDATE 03/03/12: Doing about the same. Lots of stress, still smoking. Tried TPX, but not helping.   PRIOR HPI: 54 year old female with history of depression,  anxiety, fibromyalgia, hypertension, diabetes, here for evaluation of headaches, pain and eye twitching. For the past 3 months, patient has had right temporal headaches, intermittent right occipital tenderness to palpation with radiation to the right eye, intermittent facial numbness, intermittent slurred speech, intermittent blurred vision and right eye twitching.  No nausea or vomiting.  She has some photophobia.  She has tried Maxalt without relief of symptoms.  She has daily symptoms without relief.  She does not take any medications specifically for these headache symptoms.  She is on Lyrica for fibromyalgia and Percocet for back pain.   REVIEW OF SYSTEMS: Full 14 system review of systems performed and negative with exception of: as per HPI.    ALLERGIES: Allergies  Allergen Reactions  . Neurontin [Gabapentin] Other (See Comments)    Out of it and could not function in this while on all the other bp meds.    HOME MEDICATIONS: Outpatient Medications Prior to Visit  Medication Sig Dispense Refill  . amLODipine (NORVASC) 2.5 MG tablet Take 2.5 mg by mouth 2 (two) times daily. For BP 140/90 or >    . aspirin 81 MG tablet Take 81 mg by mouth daily.     Marland Kitchen atorvastatin (LIPITOR) 10 MG tablet Take 10 mg by mouth daily.     Marland Kitchen buPROPion (WELLBUTRIN XL) 150 MG 24 hr tablet Take 150 mg by mouth daily.  3  . busPIRone (BUSPAR) 15 MG tablet Take 15 mg by mouth 3 (three) times daily.  4  . Cholecalciferol (VITAMIN D-3) 5000 units TABS  Take 5,000 Units by mouth daily.    . clopidogrel (PLAVIX) 75 MG tablet Take 75 mg by mouth daily.    . COMBIVENT RESPIMAT 20-100 MCG/ACT AERS respimat Inhale 2 puffs into the lungs every 6 (six) hours as needed for wheezing or shortness of breath.     . DEXILANT 60 MG capsule TAKE 1 CAPSULE BY MOUTH ONCE A DAY. 30 capsule 3  . dicyclomine (BENTYL) 10 MG capsule TAKE 1 CAPSULE(10 MG) BY MOUTH FOUR TIMES DAILY BEFORE MEALS AND AT BEDTIME 120 capsule 5  . DULoxetine  (CYMBALTA) 60 MG capsule Take 1 capsule (60 mg total) by mouth daily. 30 capsule 0  . fexofenadine (ALLEGRA) 180 MG tablet Take 180 mg by mouth daily.     . fluticasone (FLONASE) 50 MCG/ACT nasal spray Place 2 sprays into both nostrils daily as needed for allergies.     . furosemide (LASIX) 20 MG tablet Take 10 mg by mouth daily as needed for fluid.     Marland Kitchen. insulin lispro (HUMALOG) 100 UNIT/ML KiwkPen Inject 1-4 Units into the skin 3 (three) times daily as needed for high blood sugar (for blood sugars over 150).     Marland Kitchen. lidocaine (LIDODERM) 5 % Place 1 patch onto the skin daily as needed (pain). Remove & Discard patch within 12 hours or as directed by MD    . Liraglutide (VICTOZA) 18 MG/3ML SOPN Inject 1.8 mg into the skin at bedtime.     . meloxicam (MOBIC) 7.5 MG tablet Take 1 tablet (7.5 mg total) by mouth daily. 30 tablet 5  . metFORMIN (GLUCOPHAGE) 500 MG tablet Take 1,000 mg by mouth 2 (two) times daily with a meal.    . metoprolol (LOPRESSOR) 100 MG tablet Take 100 mg by mouth daily.     . Multiple Vitamin (MULTIVITAMIN WITH MINERALS) TABS tablet Take 1 tablet by mouth daily.    Marland Kitchen. oxyCODONE-acetaminophen (PERCOCET) 10-325 MG tablet Take 1 tablet by mouth every 4 (four) hours as needed for pain.   0  . pioglitazone (ACTOS) 15 MG tablet Take 15 mg by mouth daily.  2  . Polyethyl Glycol-Propyl Glycol (SYSTANE) 0.4-0.3 % SOLN Place 1-2 drops into both eyes 3 (three) times daily as needed (for dry eyes.).    Marland Kitchen. prazosin (MINIPRESS) 1 MG capsule Take 1 mg by mouth at bedtime.  4  . Probiotic Product (PHILLIPS COLON HEALTH PO) Take 1 capsule by mouth daily.    . valsartan-hydrochlorothiazide (DIOVAN-HCT) 160-25 MG per tablet Take 1 tablet by mouth daily.    . VOLTAREN 1 % GEL Apply 2 g topically daily as needed (pain).     . mirtazapine (REMERON) 15 MG tablet Take 15 mg by mouth at bedtime.  (Patient not taking: Reported on 08/29/2020)    . methocarbamol (ROBAXIN) 500 MG tablet Take 1 tablet by mouth 2  (two) times daily as needed for muscle spasms.   0  . methocarbamol (ROBAXIN) 500 MG tablet Take 1 tablet (500 mg total) by mouth every 6 (six) hours as needed for muscle spasms. 40 tablet 1  . potassium chloride (K-DUR) 10 MEQ tablet Take 10 mEq by mouth daily as needed. For fluid retention with lasix use.  2   No facility-administered medications prior to visit.    PAST MEDICAL HISTORY: Past Medical History:  Diagnosis Date  . Arthritis   . BMI (body mass index) 20.0-29.9 2008 192 lbs  . Cervicalgia   . Claustrophobia   . Complication of anesthesia  post op N/V  . CVA (cerebral vascular accident) (HCC) 06/2020  . Depression   . Depressive disorder   . Diabetes mellitus    type 2  . Fibromyalgia   . GERD (gastroesophageal reflux disease) 2002   202 lbs  . Goiter   . Hypercholesteremia   . Hypertension   . Insomnia   . Irritable bowel syndrome 2008   diarrhea predominant MEDS TRIED: BENTYL, LEVBID  . LBP (low back pain)   . Migraine   . PONV (postoperative nausea and vomiting)   . PTSD (post-traumatic stress disorder)   . RUQ abdominal pain 2004 HIDA NL CTA w/ IVC-FATTY LIVER, ANGIOMYOLIPOMA LEFT KIDNEY   2008 HIDA NL  . TIA (transient ischemic attack) 06/2020    PAST SURGICAL HISTORY: Past Surgical History:  Procedure Laterality Date  . ANKLE SURGERY  RIGHT  . BREAST REDUCTION SURGERY  2006  . COLONOSCOPY  2008 RMR   PAN-COLONIC TICS, NL TI  . COLONOSCOPY  2013   Dr. Teena Dunk: diverticulum in ascending colon, mild diverticulosis in sigmoid. Recommended 5 year surveillance  . COLONOSCOPY WITH PROPOFOL N/A 05/12/2018   Dr. Jena Gauss: pancolonic diverticulosis, otherwise normal  . ESOPHAGEAL DILATION N/A 04/06/2015   Procedure: ESOPHAGEAL DILATION;  Surgeon: Corbin Ade, MD;  Location: [facility];  Service: Gastroenterology;  Laterality: N/A;  300  . ESOPHAGOGASTRODUODENOSCOPY N/A 04/06/2015   RMR: Normal EGD status post Maloney dilation of teh esophagus as described  above  . ESOPHAGOGASTRODUODENOSCOPY (EGD) WITH PROPOFOL N/A 05/12/2018   Dr. Jena Gauss: normal esophagus s/p dilation, normal stomach, normal duodenum  . LAPAROSCOPIC BILATERAL SALPINGO OOPHERECTOMY Bilateral 11/22/2015   Procedure: LAPAROSCOPIC BILATERAL SALPINGO OOPHORECTOMY;  Surgeon: Tilda Burrow, MD;  Location: [facility];  Service: Gynecology;  Laterality: Bilateral;  . LUMBAR DISC SURGERY  2005   x2  . MALONEY DILATION N/A 05/12/2018   Procedure: Elease Hashimoto DILATION;  Surgeon: Corbin Ade, MD;  Location: [facility];  Service: Endoscopy;  Laterality: N/A;  . SHOULDER SURGERY  1997 RIGHT  . TOTAL ABDOMINAL HYSTERECTOMY  2000 fibroids/DUB  . UPPER GASTROINTESTINAL ENDOSCOPY  2008 RMR   SML HH o/w NL  . vocal cord polyp removal  2013   Dr. Josephina Shih    FAMILY HISTORY: Family History  Problem Relation Age of Onset  . Breast cancer Maternal Aunt   . Anxiety disorder Maternal Aunt   . Anxiety disorder Mother   . Stroke Mother   . COPD Mother   . Hypertension Mother   . Thyroid disease Mother   . Diabetes Mother   . Drug abuse Daughter   . Anxiety disorder Daughter   . Seizures Sister   . Alcohol abuse Maternal Uncle   . Mental illness Cousin   . Dementia Maternal Aunt   . Anxiety disorder Sister   . Stroke Sister   . Anxiety disorder Sister   . Drug abuse Sister   . Early death Sister   . Heart disease Sister   . Heart attack Sister   . Stroke Maternal Grandmother   . Colon cancer Maternal Aunt        in her 67s  . Pancreatic cancer Maternal Aunt        great aunt  . Colon polyps Neg Hx   . ADD / ADHD Neg Hx   . Bipolar disorder Neg Hx   . Depression Neg Hx   . OCD Neg Hx   . Paranoid behavior Neg Hx   . Schizophrenia  Neg Hx   . Sexual abuse Neg Hx   . Physical abuse Neg Hx     SOCIAL HISTORY:  Social History   Socioeconomic History  . Marital status: Single    Spouse name: Not on file  . Number of children: 3  . Years of education: 12th  . Highest  education level: Not on file  Occupational History    Employer: RETIRED    Comment: [employer]  Tobacco Use  . Smoking status: Former Smoker    Packs/day: 0.50    Years: 2.00    Pack years: 1.00    Types: Cigarettes    Quit date: 06/29/2020    Years since quitting: 0.1  . Smokeless tobacco: Never Used  Vaping Use  . Vaping Use: Never used  Substance and Sexual Activity  . Alcohol use: No    Alcohol/week: 0.0 standard drinks  . Drug use: No  . Sexual activity: Yes    Birth control/protection: Surgical  Other Topics Concern  . Not on file  Social History Narrative   Patient lives at home with her family. Raising two grand daughters, daughter passed away 04/11/2016   Caffeine Use: 2 cups of coffee daily   Social Determinants of Health   Financial Resource Strain:   . Difficulty of Paying Living Expenses: Not on file  Food Insecurity:   . Worried About Programme researcher, broadcasting/film/video in the Last Year: Not on file  . Ran Out of Food in the Last Year: Not on file  Transportation Needs:   . Lack of Transportation (Medical): Not on file  . Lack of Transportation (Non-Medical): Not on file  Physical Activity:   . Days of Exercise per Week: Not on file  . Minutes of Exercise per Session: Not on file  Stress:   . Feeling of Stress : Not on file  Social Connections:   . Frequency of Communication with Friends and Family: Not on file  . Frequency of Social Gatherings with Friends and Family: Not on file  . Attends Religious Services: Not on file  . Active Member of Clubs or Organizations: Not on file  . Attends Banker Meetings: Not on file  . Marital Status: Not on file  Intimate Partner Violence:   . Fear of Current or Ex-Partner: Not on file  . Emotionally Abused: Not on file  . Physically Abused: Not on file  . Sexually Abused: Not on file     PHYSICAL EXAM  GENERAL EXAM/CONSTITUTIONAL: Vitals:  Vitals:   08/29/20 1402  BP: 129/84  Pulse: 75  Weight: 199 lb (90.3  kg)  Height: 5\' 11"  (1.803 m)   Body mass index is 27.75 kg/m. No exam data present  Patient is in no distress; well developed, nourished and groomed  CARDIOVASCULAR:  Examination of carotid arteries is normal; no carotid bruits  Regular rate and rhythm, no murmurs  Examination of peripheral vascular system by observation and palpation is normal  EYES:  Ophthalmoscopic exam of optic discs and posterior segments is normal; no papilledema or hemorrhages  MUSCULOSKELETAL:  Gait, strength, tone, movements noted in Neurologic exam below  NEUROLOGIC: MENTAL STATUS:  No flowsheet data found.  awake, alert, oriented to person, place and time  recent and remote memory intact  normal attention and concentration  language fluent, comprehension intact, naming intact,   fund of knowledge appropriate  CRANIAL NERVE:   2nd - no papilledema on fundoscopic exam  2nd, 3rd, 4th, 6th - pupils equal  and reactive to light, visual fields full to confrontation, extraocular muscles intact, no nystagmus  5th - facial sensation symmetric  7th - facial strength symmetric  8th - hearing intact  9th - palate elevates symmetrically, uvula midline  11th - shoulder shrug symmetric  12th - tongue protrusion midline  MOTOR:   normal bulk and tone, full strength in the BUE, BLE  SENSORY:   normal and symmetric to light touch, temperature, vibration; EXCEPT SLIGHT DECR IN LEFT HAND  COORDINATION:   finger-nose-finger, fine finger movements normal  REFLEXES:   deep tendon reflexes present and symmetric  GAIT/STATION:   narrow based gait    DIAGNOSTIC DATA (LABS, IMAGING, TESTING) - I reviewed patient records, labs, notes, testing and imaging myself where available.  Lab Results  Component Value Date   WBC 11.4 (H) 08/22/2018   HGB 11.7 (L) 08/22/2018   HCT 35.6 (L) 08/22/2018   MCV 93.0 08/22/2018   PLT 260 08/22/2018      Component Value Date/Time   NA 139  08/22/2018 0720   K 3.4 (L) 08/22/2018 0720   CL 103 08/22/2018 0720   CO2 29 08/22/2018 0720   GLUCOSE 182 (H) 08/22/2018 0720   BUN 7 08/22/2018 0720   CREATININE 0.75 08/22/2018 0720   CREATININE 0.95 02/14/2018 1103   CALCIUM 8.5 (L) 08/22/2018 0720   PROT 7.1 02/14/2018 1103   AST 19 02/14/2018 1103   ALT 27 02/14/2018 1103   BILITOT 0.5 02/14/2018 1103   GFRNONAA >60 08/22/2018 0720   GFRNONAA 69 02/14/2018 1103   GFRAA >60 08/22/2018 0720   GFRAA 80 02/14/2018 1103   No results found for: CHOL, HDL, LDLCALC, LDLDIRECT, TRIG, CHOLHDL Lab Results  Component Value Date   HGBA1C 7.1 (H) 08/13/2018   No results found for: VITAMINB12 No results found for: TSH   04/12/15 MRI cervical spine [I reviewed images myself and agree with interpretation. -VRP]  - Mild bulging at C5-6. No change from the prior study. No disc protrusion or neural impingement identified.  07/08/20 MRI brain - 4 mm acute infarct right thalamus  - Mild to moderate chronic microvascular ischemic change in the white  Matter.                  ASSESSMENT AND PLAN  54 y.o. year old female here with fibromyalgia, neck pain, Low back pain, high stress levels, migraine headaches, cervicogenic headaches, cervical dystonia. Here for evaluation of right thalamic stroke (Aug 2021).   Tried and failed: topiramate, maxalt, botox   Dx:  1. Thalamic stroke (HCC)   2. Hypertension, unspecified type   3. Type 2 diabetes mellitus with other neurologic complication, with long-term current use of insulin (HCC)      PLAN:  RIGHT THALAMIC ISCHEMIC STROKE (small vessel thrombosis) - continue aspirin 81 + plavix 75 daily until Oct 08, 2020; then stop aspirin and continue plavix 75mg  daily - continue statin, BP control, diabetes control  - optimize nutrition, exercise, sleep, stress mgmt - follow up TTE, carotid u/s results per PCP (unremarkable per patient report)  Return for return to PCP, pending if symptoms  worsen or fail to improve.    , MD 08/29/2020, 2:22 PM Certified in Neurology, Neurophysiology and Neuroimaging  [facility] [address], [address] [place], [place] [id number]

## 2020-08-29 NOTE — Patient Instructions (Signed)
  RIGHT THALAMIC ISCHEMIC STROKE (small vessel thrombosis) - continue aspirin 81 + plavix 75 daily until Oct 08, 2020; then stop aspirin and continue plavix 75mg  daily - continue statin, BP control, diabetes control  - optimize nutrition, exercise, sleep, stress mgmt

## 2020-09-08 DIAGNOSIS — Z79899 Other long term (current) drug therapy: Secondary | ICD-10-CM | POA: Diagnosis not present

## 2020-09-08 DIAGNOSIS — M47816 Spondylosis without myelopathy or radiculopathy, lumbar region: Secondary | ICD-10-CM | POA: Diagnosis not present

## 2020-09-08 DIAGNOSIS — M5136 Other intervertebral disc degeneration, lumbar region: Secondary | ICD-10-CM | POA: Diagnosis not present

## 2020-09-08 DIAGNOSIS — Z79891 Long term (current) use of opiate analgesic: Secondary | ICD-10-CM | POA: Diagnosis not present

## 2020-09-08 DIAGNOSIS — M25569 Pain in unspecified knee: Secondary | ICD-10-CM | POA: Diagnosis not present

## 2020-09-08 DIAGNOSIS — G894 Chronic pain syndrome: Secondary | ICD-10-CM | POA: Diagnosis not present

## 2020-09-17 DIAGNOSIS — I10 Essential (primary) hypertension: Secondary | ICD-10-CM | POA: Diagnosis not present

## 2020-10-11 DIAGNOSIS — M47816 Spondylosis without myelopathy or radiculopathy, lumbar region: Secondary | ICD-10-CM | POA: Diagnosis not present

## 2020-10-11 DIAGNOSIS — M5136 Other intervertebral disc degeneration, lumbar region: Secondary | ICD-10-CM | POA: Diagnosis not present

## 2020-10-11 DIAGNOSIS — G894 Chronic pain syndrome: Secondary | ICD-10-CM | POA: Diagnosis not present

## 2020-10-11 DIAGNOSIS — M25569 Pain in unspecified knee: Secondary | ICD-10-CM | POA: Diagnosis not present

## 2020-10-18 DIAGNOSIS — I10 Essential (primary) hypertension: Secondary | ICD-10-CM | POA: Diagnosis not present

## 2020-10-18 DIAGNOSIS — E7800 Pure hypercholesterolemia, unspecified: Secondary | ICD-10-CM | POA: Diagnosis not present

## 2020-10-18 DIAGNOSIS — E119 Type 2 diabetes mellitus without complications: Secondary | ICD-10-CM | POA: Diagnosis not present

## 2020-11-05 ENCOUNTER — Other Ambulatory Visit: Payer: Self-pay | Admitting: Orthopedic Surgery

## 2020-11-05 DIAGNOSIS — M7541 Impingement syndrome of right shoulder: Secondary | ICD-10-CM

## 2020-11-10 DIAGNOSIS — M47816 Spondylosis without myelopathy or radiculopathy, lumbar region: Secondary | ICD-10-CM | POA: Diagnosis not present

## 2020-11-10 DIAGNOSIS — M797 Fibromyalgia: Secondary | ICD-10-CM | POA: Diagnosis not present

## 2020-11-10 DIAGNOSIS — M5136 Other intervertebral disc degeneration, lumbar region: Secondary | ICD-10-CM | POA: Diagnosis not present

## 2020-11-17 DIAGNOSIS — E119 Type 2 diabetes mellitus without complications: Secondary | ICD-10-CM | POA: Diagnosis not present

## 2020-11-17 DIAGNOSIS — I10 Essential (primary) hypertension: Secondary | ICD-10-CM | POA: Diagnosis not present

## 2020-11-17 DIAGNOSIS — E7800 Pure hypercholesterolemia, unspecified: Secondary | ICD-10-CM | POA: Diagnosis not present

## 2020-12-19 DIAGNOSIS — E7800 Pure hypercholesterolemia, unspecified: Secondary | ICD-10-CM | POA: Diagnosis not present

## 2020-12-19 DIAGNOSIS — E1142 Type 2 diabetes mellitus with diabetic polyneuropathy: Secondary | ICD-10-CM | POA: Diagnosis not present

## 2020-12-19 DIAGNOSIS — F32A Depression, unspecified: Secondary | ICD-10-CM | POA: Diagnosis not present

## 2020-12-19 DIAGNOSIS — I10 Essential (primary) hypertension: Secondary | ICD-10-CM | POA: Diagnosis not present

## 2020-12-27 DIAGNOSIS — Z79891 Long term (current) use of opiate analgesic: Secondary | ICD-10-CM | POA: Diagnosis not present

## 2020-12-27 DIAGNOSIS — M5136 Other intervertebral disc degeneration, lumbar region: Secondary | ICD-10-CM | POA: Diagnosis not present

## 2020-12-27 DIAGNOSIS — M25569 Pain in unspecified knee: Secondary | ICD-10-CM | POA: Diagnosis not present

## 2020-12-27 DIAGNOSIS — G894 Chronic pain syndrome: Secondary | ICD-10-CM | POA: Diagnosis not present

## 2020-12-27 DIAGNOSIS — Z79899 Other long term (current) drug therapy: Secondary | ICD-10-CM | POA: Diagnosis not present

## 2020-12-27 DIAGNOSIS — M47816 Spondylosis without myelopathy or radiculopathy, lumbar region: Secondary | ICD-10-CM | POA: Diagnosis not present

## 2021-01-04 ENCOUNTER — Other Ambulatory Visit: Payer: Self-pay | Admitting: Gastroenterology

## 2021-01-16 DIAGNOSIS — Z299 Encounter for prophylactic measures, unspecified: Secondary | ICD-10-CM | POA: Diagnosis not present

## 2021-01-16 DIAGNOSIS — E1142 Type 2 diabetes mellitus with diabetic polyneuropathy: Secondary | ICD-10-CM | POA: Diagnosis not present

## 2021-01-16 DIAGNOSIS — Z789 Other specified health status: Secondary | ICD-10-CM | POA: Diagnosis not present

## 2021-01-16 DIAGNOSIS — F32A Depression, unspecified: Secondary | ICD-10-CM | POA: Diagnosis not present

## 2021-01-16 DIAGNOSIS — G4700 Insomnia, unspecified: Secondary | ICD-10-CM | POA: Diagnosis not present

## 2021-01-16 DIAGNOSIS — I10 Essential (primary) hypertension: Secondary | ICD-10-CM | POA: Diagnosis not present

## 2021-01-16 DIAGNOSIS — E7800 Pure hypercholesterolemia, unspecified: Secondary | ICD-10-CM | POA: Diagnosis not present

## 2021-01-16 DIAGNOSIS — E1165 Type 2 diabetes mellitus with hyperglycemia: Secondary | ICD-10-CM | POA: Diagnosis not present

## 2021-01-16 DIAGNOSIS — E261 Secondary hyperaldosteronism: Secondary | ICD-10-CM | POA: Diagnosis not present

## 2021-01-16 DIAGNOSIS — J069 Acute upper respiratory infection, unspecified: Secondary | ICD-10-CM | POA: Diagnosis not present

## 2021-01-26 DIAGNOSIS — M79604 Pain in right leg: Secondary | ICD-10-CM | POA: Diagnosis not present

## 2021-01-26 DIAGNOSIS — M5417 Radiculopathy, lumbosacral region: Secondary | ICD-10-CM | POA: Diagnosis not present

## 2021-01-26 DIAGNOSIS — G894 Chronic pain syndrome: Secondary | ICD-10-CM | POA: Diagnosis not present

## 2021-01-26 DIAGNOSIS — M5136 Other intervertebral disc degeneration, lumbar region: Secondary | ICD-10-CM | POA: Diagnosis not present

## 2021-02-15 DIAGNOSIS — E7800 Pure hypercholesterolemia, unspecified: Secondary | ICD-10-CM | POA: Diagnosis not present

## 2021-02-15 DIAGNOSIS — F32A Depression, unspecified: Secondary | ICD-10-CM | POA: Diagnosis not present

## 2021-02-15 DIAGNOSIS — R413 Other amnesia: Secondary | ICD-10-CM | POA: Diagnosis not present

## 2021-02-16 DIAGNOSIS — I10 Essential (primary) hypertension: Secondary | ICD-10-CM | POA: Diagnosis not present

## 2021-02-21 DIAGNOSIS — M25561 Pain in right knee: Secondary | ICD-10-CM | POA: Diagnosis not present

## 2021-03-17 DIAGNOSIS — I10 Essential (primary) hypertension: Secondary | ICD-10-CM | POA: Diagnosis not present

## 2021-03-18 DIAGNOSIS — F32A Depression, unspecified: Secondary | ICD-10-CM | POA: Diagnosis not present

## 2021-03-18 DIAGNOSIS — E7800 Pure hypercholesterolemia, unspecified: Secondary | ICD-10-CM | POA: Diagnosis not present

## 2021-03-18 DIAGNOSIS — E1142 Type 2 diabetes mellitus with diabetic polyneuropathy: Secondary | ICD-10-CM | POA: Diagnosis not present

## 2021-03-21 DIAGNOSIS — Z79899 Other long term (current) drug therapy: Secondary | ICD-10-CM | POA: Diagnosis not present

## 2021-03-21 DIAGNOSIS — Z79891 Long term (current) use of opiate analgesic: Secondary | ICD-10-CM | POA: Diagnosis not present

## 2021-03-21 DIAGNOSIS — M47816 Spondylosis without myelopathy or radiculopathy, lumbar region: Secondary | ICD-10-CM | POA: Diagnosis not present

## 2021-03-21 DIAGNOSIS — M25561 Pain in right knee: Secondary | ICD-10-CM | POA: Diagnosis not present

## 2021-03-21 DIAGNOSIS — G894 Chronic pain syndrome: Secondary | ICD-10-CM | POA: Diagnosis not present

## 2021-03-21 DIAGNOSIS — M5136 Other intervertebral disc degeneration, lumbar region: Secondary | ICD-10-CM | POA: Diagnosis not present

## 2021-04-17 DIAGNOSIS — E1142 Type 2 diabetes mellitus with diabetic polyneuropathy: Secondary | ICD-10-CM | POA: Diagnosis not present

## 2021-04-17 DIAGNOSIS — E7800 Pure hypercholesterolemia, unspecified: Secondary | ICD-10-CM | POA: Diagnosis not present

## 2021-04-17 DIAGNOSIS — F32A Depression, unspecified: Secondary | ICD-10-CM | POA: Diagnosis not present

## 2021-04-18 DIAGNOSIS — I10 Essential (primary) hypertension: Secondary | ICD-10-CM | POA: Diagnosis not present

## 2021-04-20 DIAGNOSIS — G894 Chronic pain syndrome: Secondary | ICD-10-CM | POA: Diagnosis not present

## 2021-04-20 DIAGNOSIS — M5136 Other intervertebral disc degeneration, lumbar region: Secondary | ICD-10-CM | POA: Diagnosis not present

## 2021-04-20 DIAGNOSIS — M79604 Pain in right leg: Secondary | ICD-10-CM | POA: Diagnosis not present

## 2021-04-20 DIAGNOSIS — M5417 Radiculopathy, lumbosacral region: Secondary | ICD-10-CM | POA: Diagnosis not present

## 2021-04-24 DIAGNOSIS — I10 Essential (primary) hypertension: Secondary | ICD-10-CM | POA: Diagnosis not present

## 2021-04-24 DIAGNOSIS — Z299 Encounter for prophylactic measures, unspecified: Secondary | ICD-10-CM | POA: Diagnosis not present

## 2021-04-24 DIAGNOSIS — E1165 Type 2 diabetes mellitus with hyperglycemia: Secondary | ICD-10-CM | POA: Diagnosis not present

## 2021-04-24 DIAGNOSIS — I69359 Hemiplegia and hemiparesis following cerebral infarction affecting unspecified side: Secondary | ICD-10-CM | POA: Diagnosis not present

## 2021-04-24 DIAGNOSIS — Z794 Long term (current) use of insulin: Secondary | ICD-10-CM | POA: Diagnosis not present

## 2021-05-11 DIAGNOSIS — J069 Acute upper respiratory infection, unspecified: Secondary | ICD-10-CM | POA: Diagnosis not present

## 2021-05-11 DIAGNOSIS — Z794 Long term (current) use of insulin: Secondary | ICD-10-CM | POA: Diagnosis not present

## 2021-05-11 DIAGNOSIS — Z299 Encounter for prophylactic measures, unspecified: Secondary | ICD-10-CM | POA: Diagnosis not present

## 2021-05-11 DIAGNOSIS — I69359 Hemiplegia and hemiparesis following cerebral infarction affecting unspecified side: Secondary | ICD-10-CM | POA: Diagnosis not present

## 2021-05-11 DIAGNOSIS — I10 Essential (primary) hypertension: Secondary | ICD-10-CM | POA: Diagnosis not present

## 2021-05-18 ENCOUNTER — Other Ambulatory Visit: Payer: Self-pay | Admitting: Internal Medicine

## 2021-05-18 DIAGNOSIS — I10 Essential (primary) hypertension: Secondary | ICD-10-CM | POA: Diagnosis not present

## 2021-05-18 DIAGNOSIS — F32A Depression, unspecified: Secondary | ICD-10-CM | POA: Diagnosis not present

## 2021-05-18 DIAGNOSIS — E7800 Pure hypercholesterolemia, unspecified: Secondary | ICD-10-CM | POA: Diagnosis not present

## 2021-05-18 DIAGNOSIS — Z139 Encounter for screening, unspecified: Secondary | ICD-10-CM

## 2021-05-18 DIAGNOSIS — E1142 Type 2 diabetes mellitus with diabetic polyneuropathy: Secondary | ICD-10-CM | POA: Diagnosis not present

## 2021-05-26 DIAGNOSIS — R809 Proteinuria, unspecified: Secondary | ICD-10-CM | POA: Diagnosis not present

## 2021-05-26 DIAGNOSIS — H6504 Acute serous otitis media, recurrent, right ear: Secondary | ICD-10-CM | POA: Diagnosis not present

## 2021-05-26 DIAGNOSIS — E7800 Pure hypercholesterolemia, unspecified: Secondary | ICD-10-CM | POA: Diagnosis not present

## 2021-05-26 DIAGNOSIS — E1129 Type 2 diabetes mellitus with other diabetic kidney complication: Secondary | ICD-10-CM | POA: Diagnosis not present

## 2021-05-26 DIAGNOSIS — J449 Chronic obstructive pulmonary disease, unspecified: Secondary | ICD-10-CM | POA: Diagnosis not present

## 2021-05-26 DIAGNOSIS — Z0000 Encounter for general adult medical examination without abnormal findings: Secondary | ICD-10-CM | POA: Diagnosis not present

## 2021-05-26 DIAGNOSIS — Z79899 Other long term (current) drug therapy: Secondary | ICD-10-CM | POA: Diagnosis not present

## 2021-05-26 DIAGNOSIS — I10 Essential (primary) hypertension: Secondary | ICD-10-CM | POA: Diagnosis not present

## 2021-05-26 DIAGNOSIS — Z299 Encounter for prophylactic measures, unspecified: Secondary | ICD-10-CM | POA: Diagnosis not present

## 2021-05-26 DIAGNOSIS — R5383 Other fatigue: Secondary | ICD-10-CM | POA: Diagnosis not present

## 2021-05-26 DIAGNOSIS — Z6821 Body mass index (BMI) 21.0-21.9, adult: Secondary | ICD-10-CM | POA: Diagnosis not present

## 2021-05-26 DIAGNOSIS — Z7189 Other specified counseling: Secondary | ICD-10-CM | POA: Diagnosis not present

## 2021-05-29 DIAGNOSIS — G894 Chronic pain syndrome: Secondary | ICD-10-CM | POA: Diagnosis not present

## 2021-05-29 DIAGNOSIS — M5030 Other cervical disc degeneration, unspecified cervical region: Secondary | ICD-10-CM | POA: Diagnosis not present

## 2021-05-29 DIAGNOSIS — M5136 Other intervertebral disc degeneration, lumbar region: Secondary | ICD-10-CM | POA: Diagnosis not present

## 2021-05-29 DIAGNOSIS — M5417 Radiculopathy, lumbosacral region: Secondary | ICD-10-CM | POA: Diagnosis not present

## 2021-06-16 DIAGNOSIS — I10 Essential (primary) hypertension: Secondary | ICD-10-CM | POA: Diagnosis not present

## 2021-06-18 DIAGNOSIS — I10 Essential (primary) hypertension: Secondary | ICD-10-CM | POA: Diagnosis not present

## 2021-06-26 DIAGNOSIS — Z79899 Other long term (current) drug therapy: Secondary | ICD-10-CM | POA: Diagnosis not present

## 2021-06-26 DIAGNOSIS — G894 Chronic pain syndrome: Secondary | ICD-10-CM | POA: Diagnosis not present

## 2021-06-26 DIAGNOSIS — M961 Postlaminectomy syndrome, not elsewhere classified: Secondary | ICD-10-CM | POA: Diagnosis not present

## 2021-06-26 DIAGNOSIS — Z79891 Long term (current) use of opiate analgesic: Secondary | ICD-10-CM | POA: Diagnosis not present

## 2021-06-26 DIAGNOSIS — M5136 Other intervertebral disc degeneration, lumbar region: Secondary | ICD-10-CM | POA: Diagnosis not present

## 2021-06-26 DIAGNOSIS — M5417 Radiculopathy, lumbosacral region: Secondary | ICD-10-CM | POA: Diagnosis not present

## 2021-07-19 DIAGNOSIS — F32A Depression, unspecified: Secondary | ICD-10-CM | POA: Diagnosis not present

## 2021-07-19 DIAGNOSIS — E1142 Type 2 diabetes mellitus with diabetic polyneuropathy: Secondary | ICD-10-CM | POA: Diagnosis not present

## 2021-07-19 DIAGNOSIS — E7800 Pure hypercholesterolemia, unspecified: Secondary | ICD-10-CM | POA: Diagnosis not present

## 2021-07-19 DIAGNOSIS — I10 Essential (primary) hypertension: Secondary | ICD-10-CM | POA: Diagnosis not present

## 2021-07-25 DIAGNOSIS — M5136 Other intervertebral disc degeneration, lumbar region: Secondary | ICD-10-CM | POA: Diagnosis not present

## 2021-07-25 DIAGNOSIS — M961 Postlaminectomy syndrome, not elsewhere classified: Secondary | ICD-10-CM | POA: Diagnosis not present

## 2021-07-25 DIAGNOSIS — M5417 Radiculopathy, lumbosacral region: Secondary | ICD-10-CM | POA: Diagnosis not present

## 2021-07-25 DIAGNOSIS — G894 Chronic pain syndrome: Secondary | ICD-10-CM | POA: Diagnosis not present

## 2021-08-01 ENCOUNTER — Other Ambulatory Visit: Payer: Self-pay

## 2021-08-01 ENCOUNTER — Ambulatory Visit
Admission: RE | Admit: 2021-08-01 | Discharge: 2021-08-01 | Disposition: A | Discharge status: Home / Self Care | Payer: Medicare Other | Source: Ambulatory Visit | Attending: Internal Medicine | Admitting: Internal Medicine

## 2021-08-01 DIAGNOSIS — Z139 Encounter for screening, unspecified: Secondary | ICD-10-CM

## 2021-08-01 DIAGNOSIS — Z1231 Encounter for screening mammogram for malignant neoplasm of breast: Secondary | ICD-10-CM | POA: Diagnosis not present

## 2021-08-14 DIAGNOSIS — E1165 Type 2 diabetes mellitus with hyperglycemia: Secondary | ICD-10-CM | POA: Diagnosis not present

## 2021-08-14 DIAGNOSIS — Z794 Long term (current) use of insulin: Secondary | ICD-10-CM | POA: Diagnosis not present

## 2021-08-14 DIAGNOSIS — I10 Essential (primary) hypertension: Secondary | ICD-10-CM | POA: Diagnosis not present

## 2021-08-14 DIAGNOSIS — Z299 Encounter for prophylactic measures, unspecified: Secondary | ICD-10-CM | POA: Diagnosis not present

## 2021-08-18 DIAGNOSIS — I10 Essential (primary) hypertension: Secondary | ICD-10-CM | POA: Diagnosis not present

## 2021-08-28 DIAGNOSIS — M5417 Radiculopathy, lumbosacral region: Secondary | ICD-10-CM | POA: Diagnosis not present

## 2021-08-28 DIAGNOSIS — M5136 Other intervertebral disc degeneration, lumbar region: Secondary | ICD-10-CM | POA: Diagnosis not present

## 2021-08-28 DIAGNOSIS — M961 Postlaminectomy syndrome, not elsewhere classified: Secondary | ICD-10-CM | POA: Diagnosis not present

## 2021-08-28 DIAGNOSIS — G894 Chronic pain syndrome: Secondary | ICD-10-CM | POA: Diagnosis not present

## 2021-09-18 DIAGNOSIS — E049 Nontoxic goiter, unspecified: Secondary | ICD-10-CM | POA: Diagnosis not present

## 2021-09-18 DIAGNOSIS — I10 Essential (primary) hypertension: Secondary | ICD-10-CM | POA: Diagnosis not present

## 2021-09-21 ENCOUNTER — Encounter: Payer: Self-pay | Admitting: Orthopedic Surgery

## 2021-09-21 ENCOUNTER — Other Ambulatory Visit: Payer: Self-pay

## 2021-09-21 ENCOUNTER — Ambulatory Visit: Payer: Medicare Other

## 2021-09-21 ENCOUNTER — Ambulatory Visit (INDEPENDENT_AMBULATORY_CARE_PROVIDER_SITE_OTHER): Payer: Medicare Other | Admitting: Orthopedic Surgery

## 2021-09-21 VITALS — BP 144/95 | HR 78 | Ht 71.0 in | Wt 219.0 lb

## 2021-09-21 DIAGNOSIS — M25512 Pain in left shoulder: Secondary | ICD-10-CM

## 2021-09-21 DIAGNOSIS — M7542 Impingement syndrome of left shoulder: Secondary | ICD-10-CM

## 2021-09-21 NOTE — Progress Notes (Signed)
Chief Complaint  Patient presents with   Shoulder Pain    Lt shoulder pain has been on and off but getting worse over past month.    History 55 year old female atraumatic onset left shoulder pain throbbing like a toothache painful range of motion causing her to have some loss of motion especially abduction and flexion  Review of systems no neck pain numbness or tingling  BP (!) 144/95   Pulse 78   Ht 5\' 11"  (1.803 m)   Wt 219 lb (99.3 kg)   BMI 30.54 kg/m   Physical Exam Constitutional:      General: She is not in acute distress.    Appearance: She is well-developed.     Comments: Well developed, well nourished Normal grooming and hygiene     Cardiovascular:     Comments: No peripheral edema Musculoskeletal:     Left shoulder: Tenderness and crepitus present. No swelling, deformity, effusion or laceration. Decreased range of motion. Normal strength. Normal pulse.     Comments: Positive impingement sign at 120 degrees  Normal external rotation indicating no adhesive capsulitis  Stable in abduction external rotation  Skin:    General: Skin is warm and dry.  Neurological:     Mental Status: She is alert and oriented to person, place, and time.     Sensory: No sensory deficit.     Coordination: Coordination normal.     Gait: Gait normal.     Deep Tendon Reflexes: Reflexes are normal and symmetric.  Psychiatric:        Mood and Affect: Mood normal.        Behavior: Behavior normal.        Thought Content: Thought content normal.        Judgment: Judgment normal.     Comments: Affect normal    Encounter Diagnoses  Name Primary?   Acute pain of left shoulder    Impingement syndrome of left shoulder Yes   Xrays left shoulder no acute findings   Recommend  Stretching strengthening program left shoulder  Subacromial injection  Procedure note the subacromial injection shoulder left   Verbal consent was obtained to inject the  Left   Shoulder  Timeout was  completed to confirm the injection site is a subacromial space of the  left  shoulder  Medication used Depo-Medrol 40 mg and lidocaine 1% 3 cc  Anesthesia was provided by ethyl chloride  The injection was performed in the left  posterior subacromial space. After pinning the skin with alcohol and anesthetized the skin with ethyl chloride the subacromial space was injected using a 20-gauge needle. There were no complications  Sterile dressing was applied.

## 2021-09-27 DIAGNOSIS — M5417 Radiculopathy, lumbosacral region: Secondary | ICD-10-CM | POA: Diagnosis not present

## 2021-09-27 DIAGNOSIS — M961 Postlaminectomy syndrome, not elsewhere classified: Secondary | ICD-10-CM | POA: Diagnosis not present

## 2021-09-27 DIAGNOSIS — G894 Chronic pain syndrome: Secondary | ICD-10-CM | POA: Diagnosis not present

## 2021-09-27 DIAGNOSIS — M5136 Other intervertebral disc degeneration, lumbar region: Secondary | ICD-10-CM | POA: Diagnosis not present

## 2021-10-02 ENCOUNTER — Ambulatory Visit: Payer: Medicare Other | Admitting: Orthopedic Surgery

## 2021-10-16 ENCOUNTER — Ambulatory Visit: Payer: Medicare Other | Admitting: Orthopedic Surgery

## 2021-10-18 DIAGNOSIS — I10 Essential (primary) hypertension: Secondary | ICD-10-CM | POA: Diagnosis not present

## 2021-10-25 DIAGNOSIS — Z79891 Long term (current) use of opiate analgesic: Secondary | ICD-10-CM | POA: Diagnosis not present

## 2021-10-25 DIAGNOSIS — Z79899 Other long term (current) drug therapy: Secondary | ICD-10-CM | POA: Diagnosis not present

## 2021-10-25 DIAGNOSIS — M79606 Pain in leg, unspecified: Secondary | ICD-10-CM | POA: Diagnosis not present

## 2021-10-25 DIAGNOSIS — M47816 Spondylosis without myelopathy or radiculopathy, lumbar region: Secondary | ICD-10-CM | POA: Diagnosis not present

## 2021-10-25 DIAGNOSIS — G894 Chronic pain syndrome: Secondary | ICD-10-CM | POA: Diagnosis not present

## 2021-10-25 DIAGNOSIS — M5136 Other intervertebral disc degeneration, lumbar region: Secondary | ICD-10-CM | POA: Diagnosis not present

## 2021-11-17 DIAGNOSIS — I10 Essential (primary) hypertension: Secondary | ICD-10-CM | POA: Diagnosis not present

## 2021-11-23 DIAGNOSIS — M5136 Other intervertebral disc degeneration, lumbar region: Secondary | ICD-10-CM | POA: Diagnosis not present

## 2021-11-23 DIAGNOSIS — M797 Fibromyalgia: Secondary | ICD-10-CM | POA: Diagnosis not present

## 2021-11-23 DIAGNOSIS — M47816 Spondylosis without myelopathy or radiculopathy, lumbar region: Secondary | ICD-10-CM | POA: Diagnosis not present

## 2021-12-17 DIAGNOSIS — I10 Essential (primary) hypertension: Secondary | ICD-10-CM | POA: Diagnosis not present

## 2022-01-10 DIAGNOSIS — G894 Chronic pain syndrome: Secondary | ICD-10-CM | POA: Diagnosis not present

## 2022-01-10 DIAGNOSIS — Z9889 Other specified postprocedural states: Secondary | ICD-10-CM | POA: Diagnosis not present

## 2022-01-16 DIAGNOSIS — I10 Essential (primary) hypertension: Secondary | ICD-10-CM | POA: Diagnosis not present

## 2022-02-07 DIAGNOSIS — Z79891 Long term (current) use of opiate analgesic: Secondary | ICD-10-CM | POA: Diagnosis not present

## 2022-02-07 DIAGNOSIS — Z9889 Other specified postprocedural states: Secondary | ICD-10-CM | POA: Diagnosis not present

## 2022-02-07 DIAGNOSIS — G894 Chronic pain syndrome: Secondary | ICD-10-CM | POA: Diagnosis not present

## 2022-02-07 DIAGNOSIS — M47816 Spondylosis without myelopathy or radiculopathy, lumbar region: Secondary | ICD-10-CM | POA: Diagnosis not present

## 2022-02-08 ENCOUNTER — Other Ambulatory Visit: Payer: Self-pay | Admitting: Nurse Practitioner

## 2022-02-08 DIAGNOSIS — M542 Cervicalgia: Secondary | ICD-10-CM

## 2022-02-15 DIAGNOSIS — I10 Essential (primary) hypertension: Secondary | ICD-10-CM | POA: Diagnosis not present

## 2022-03-18 DIAGNOSIS — I10 Essential (primary) hypertension: Secondary | ICD-10-CM | POA: Diagnosis not present

## 2022-03-18 DIAGNOSIS — E049 Nontoxic goiter, unspecified: Secondary | ICD-10-CM | POA: Diagnosis not present

## 2022-03-19 ENCOUNTER — Ambulatory Visit: Payer: Medicare Other | Admitting: Gastroenterology

## 2022-03-19 NOTE — Progress Notes (Deleted)
GI Office Note    Referring Provider: Kirstie Peri, MD Primary Care Physician:  Kirstie Peri, MD    Chief Complaint   No chief complaint on file.    History of Present Illness   [name] is a 56 y.o. female with a history of arthritis, cervicalgia with chronic pain, CVA in August 2021, depression, diabetes, fibromyalgia, GERD, IBS-D, and fatty liver presenting today at the request of Kirstie Peri, MD for ***  Last seen in our office 12/17/2018 -she presented with IBS with predominant diarrhea.  Celiac serologies negative in the past.  Having postprandial watery stool with urgency and intermittent cramping.  At this time she is stating that her bad days at weight are good.  She was actively trying to lose weight, eat healthy, exercise.  She was given trial of Viberzi 75 mg p.o. twice daily, would be given a round of Xifaxan if she had no improvement.  She was advised to return in 3 months for follow-up, this was supposed to be video visit however staff was not able to contact the patient.  Prior visit to this was in March 2019 when she was having epigastric and right upper quadrant pain, ultrasound abdomen with fatty liver.  She was prescribed Bentyl which she stated was not helpful.  Today:    Past Medical History:  Diagnosis Date   Arthritis    BMI (body mass index) 20.0-29.9 2008 192 lbs   Cervicalgia    Claustrophobia    Complication of anesthesia    post op N/V   CVA (cerebral vascular accident) (HCC) 06/2020   Depression    Depressive disorder    Diabetes mellitus    type 2   Fibromyalgia    GERD (gastroesophageal reflux disease) 2002   202 lbs   Goiter    Hypercholesteremia    Hypertension    Insomnia    Irritable bowel syndrome 2008   diarrhea predominant MEDS TRIED: BENTYL, LEVBID   LBP (low back pain)    Migraine    PONV (postoperative nausea and vomiting)    PTSD (post-traumatic stress disorder)    RUQ abdominal pain 2004 HIDA NL CTA w/ IVC-FATTY  LIVER, ANGIOMYOLIPOMA LEFT KIDNEY   2008 HIDA NL   TIA (transient ischemic attack) 06/2020    Past Surgical History:  Procedure Laterality Date   ANKLE SURGERY  RIGHT   BREAST REDUCTION SURGERY  2006   COLONOSCOPY  2008 [facility]   PAN-COLONIC TICS, NL TI   COLONOSCOPY  2013   Dr. Teena Dunk: diverticulum in ascending colon, mild diverticulosis in sigmoid. Recommended 5 year surveillance   COLONOSCOPY WITH PROPOFOL N/A 05/12/2018   Dr. Jena Gauss: pancolonic diverticulosis, otherwise normal   ESOPHAGEAL DILATION N/A 04/06/2015   Procedure: ESOPHAGEAL DILATION;  Surgeon: Corbin Ade, MD;  Location: [facility];  Service: Gastroenterology;  Laterality: N/A;  300   ESOPHAGOGASTRODUODENOSCOPY N/A 04/06/2015   RMR: Normal EGD status post Maloney dilation of teh esophagus as described above   ESOPHAGOGASTRODUODENOSCOPY (EGD) WITH PROPOFOL N/A 05/12/2018   Dr. Jena Gauss: normal esophagus s/p dilation, normal stomach, normal duodenum   LAPAROSCOPIC BILATERAL SALPINGO OOPHERECTOMY Bilateral 11/22/2015   Procedure: LAPAROSCOPIC BILATERAL SALPINGO OOPHORECTOMY;  Surgeon: Tilda Burrow, MD;  Location: [facility];  Service: Gynecology;  Laterality: Bilateral;   LUMBAR DISC SURGERY  2005   x2   MALONEY DILATION N/A 05/12/2018   Procedure: Elease Hashimoto DILATION;  Surgeon: Corbin Ade, MD;  Location: [facility];  Service: Endoscopy;  Laterality: N/A;   SHOULDER SURGERY  1997 RIGHT   TOTAL ABDOMINAL HYSTERECTOMY  2000 fibroids/DUB   UPPER GASTROINTESTINAL ENDOSCOPY  2008 [facility]   [facility] o/w NL   vocal cord polyp removal  2013   Dr. Josephina Shih    Current Outpatient Medications  Medication Sig Dispense Refill   amLODipine (NORVASC) 2.5 MG tablet Take 2.5 mg by mouth 2 (two) times daily. For BP 140/90 or >     aspirin 81 MG tablet Take 81 mg by mouth daily.      atorvastatin (LIPITOR) 10 MG tablet Take 10 mg by mouth daily.      buPROPion (WELLBUTRIN XL) 150 MG 24 hr tablet Take 150 mg by mouth daily.  3   busPIRone  (BUSPAR) 15 MG tablet Take 15 mg by mouth 3 (three) times daily.  4   busPIRone (BUSPAR) 15 MG tablet Take by mouth.     Cholecalciferol (VITAMIN D-3) 5000 units TABS Take 5,000 Units by mouth daily.     clopidogrel (PLAVIX) 75 MG tablet Take 75 mg by mouth daily.     clopidogrel (PLAVIX) 75 MG tablet Take 1 tablet by mouth daily.     COMBIVENT RESPIMAT 20-100 MCG/ACT AERS respimat Inhale 2 puffs into the lungs every 6 (six) hours as needed for wheezing or shortness of breath.      DEXILANT 60 MG capsule TAKE 1 CAPSULE BY MOUTH ONCE A DAY. 30 capsule 3   dicyclomine (BENTYL) 10 MG capsule TAKE 1 CAPSULE(10 MG) BY MOUTH FOUR TIMES DAILY BEFORE MEALS AND AT BEDTIME 120 capsule 5   DULoxetine (CYMBALTA) 60 MG capsule Take 1 capsule (60 mg total) by mouth daily. 30 capsule 0   fexofenadine (ALLEGRA) 180 MG tablet Take 180 mg by mouth daily.     fluticasone (FLONASE) 50 MCG/ACT nasal spray Place 2 sprays into both nostrils daily as needed for allergies.      furosemide (LASIX) 20 MG tablet Take 10 mg by mouth daily as needed for fluid.      lidocaine (LIDODERM) 5 % Place 1 patch onto the skin daily as needed (pain). Remove & Discard patch within 12 hours or as directed by MD     liraglutide (VICTOZA) 18 MG/3ML SOPN Inject 1.8 mg into the skin at bedtime.      meloxicam (MOBIC) 7.5 MG tablet TAKE 1 TABLET(7.5 MG) BY MOUTH DAILY 30 tablet 5   metFORMIN (GLUCOPHAGE) 500 MG tablet Take 1,000 mg by mouth 2 (two) times daily with a meal.     metoprolol (LOPRESSOR) 100 MG tablet Take 100 mg by mouth daily.      Multiple Vitamin (MULTIVITAMIN WITH MINERALS) TABS tablet Take 1 tablet by mouth daily.     oxyCODONE-acetaminophen (PERCOCET) 10-325 MG tablet Take 1 tablet by mouth every 4 (four) hours as needed for pain.   0   pioglitazone (ACTOS) 15 MG tablet Take 15 mg by mouth daily.  2   Polyethyl Glycol-Propyl Glycol 0.4-0.3 % SOLN Place 1-2 drops into both eyes 3 (three) times daily as needed (for dry  eyes.).     Probiotic Product (PHILLIPS COLON HEALTH PO) Take 1 capsule by mouth daily.     valsartan-hydrochlorothiazide (DIOVAN-HCT) 160-25 MG per tablet Take 1 tablet by mouth daily.     VOLTAREN 1 % GEL Apply 2 g topically daily as needed (pain).      No current facility-administered medications for this visit.    Allergies as of 03/19/2022 - Review  Complete 09/21/2021  Allergen Reaction Noted   Neurontin [gabapentin] Other (See Comments) 03/24/2013    Family History  Problem Relation Age of Onset   Anxiety disorder Mother    Stroke Mother    COPD Mother    Hypertension Mother    Thyroid disease Mother    Diabetes Mother    Seizures Sister    Anxiety disorder Sister    Stroke Sister    Breast cancer Sister    Anxiety disorder Sister    Drug abuse Sister    Early death Sister    Heart disease Sister    Heart attack Sister    Drug abuse Daughter    Anxiety disorder Daughter    Breast cancer Maternal Aunt    Anxiety disorder Maternal Aunt    Dementia Maternal Aunt    Colon cancer Maternal Aunt        in her 59s   Pancreatic cancer Maternal Aunt        great aunt   Alcohol abuse Maternal Uncle    Stroke Maternal Grandmother    Mental illness Cousin    Colon polyps Neg Hx    ADD / ADHD Neg Hx    Bipolar disorder Neg Hx    Depression Neg Hx    OCD Neg Hx    Paranoid behavior Neg Hx    Schizophrenia Neg Hx    Sexual abuse Neg Hx    Physical abuse Neg Hx     Social History   Socioeconomic History   Marital status: Single    Spouse name: Not on file   Number of children: 3   Years of education: 12th   Highest education level: Not on file  Occupational History    Employer: RETIRED    Comment: [employer]  Tobacco Use   Smoking status: Former    Packs/day: 0.50    Years: 2.00    Pack years: 1.00    Types: Cigarettes    Quit date: 06/29/2020    Years since quitting: 1.7   Smokeless tobacco: Never  Vaping Use   Vaping Use: Never used  Substance and  Sexual Activity   Alcohol use: No    Alcohol/week: 0.0 standard drinks   Drug use: No   Sexual activity: Yes    Birth control/protection: Surgical  Other Topics Concern   Not on file  Social History Narrative   Patient lives at home with her family. Raising two grand daughters, daughter passed away Apr 02, 2016   Caffeine Use: 2 cups of coffee daily   Social Determinants of Health   Financial Resource Strain: Not on file  Food Insecurity: Not on file  Transportation Needs: Not on file  Physical Activity: Not on file  Stress: Not on file  Social Connections: Not on file  Intimate Partner Violence: Not on file     Review of Systems   Gen: Denies any fever, chills, fatigue, weight loss, lack of appetite.  CV: Denies chest pain, heart palpitations, peripheral edema, syncope.  Resp: Denies shortness of breath at rest or with exertion. Denies wheezing or cough.  GI: see HPI GU : Denies urinary burning, urinary frequency, urinary hesitancy MS: Denies joint pain, muscle weakness, cramps, or limitation of movement.  Derm: Denies rash, itching, dry skin Psych: Denies depression, anxiety, memory loss, and confusion Heme: Denies bruising, bleeding, and enlarged lymph nodes.   Physical Exam   There were no vitals taken for this visit.  General:   Alert and oriented.  Pleasant and cooperative. Well-nourished and well-developed.  Head:  Normocephalic and atraumatic. Eyes:  Without icterus, sclera clear and conjunctiva pink.  Ears:  Normal auditory acuity. Mouth:  No deformity or lesions, oral mucosa pink.  Lungs:  Clear to auscultation bilaterally. No wheezes, rales, or rhonchi. No distress.  Heart:  S1, S2 present without murmurs appreciated.  Abdomen:  +BS, soft, non-tender and non-distended. No HSM noted. No guarding or rebound. No masses appreciated.  Rectal:  Deferred  Msk:  Symmetrical without gross deformities. Normal posture. Extremities:  Without edema. Neurologic:  Alert and   oriented x4;  grossly normal neurologically. Skin:  Intact without significant lesions or rashes. Psych:  Alert and cooperative. Normal mood and affect.   Assessment   [name] is a 56 y.o. female with a history of arthritis, cervicalgia with chronic pain, CVA, depression, diabetes, fibromyalgia, GERD, HTN, HLD, IBS-D, migraines*** presenting today with    PLAN   ***    Brooke Bonitoourtney Kaileah Shevchenko, MSN, FNP-BC, AGACNP-BC [facility]

## 2022-03-20 DIAGNOSIS — M25561 Pain in right knee: Secondary | ICD-10-CM | POA: Diagnosis not present

## 2022-03-20 DIAGNOSIS — M47816 Spondylosis without myelopathy or radiculopathy, lumbar region: Secondary | ICD-10-CM | POA: Diagnosis not present

## 2022-03-20 DIAGNOSIS — M25562 Pain in left knee: Secondary | ICD-10-CM | POA: Diagnosis not present

## 2022-03-20 DIAGNOSIS — G894 Chronic pain syndrome: Secondary | ICD-10-CM | POA: Diagnosis not present

## 2022-03-22 DIAGNOSIS — M542 Cervicalgia: Secondary | ICD-10-CM | POA: Diagnosis not present

## 2022-03-22 DIAGNOSIS — M47819 Spondylosis without myelopathy or radiculopathy, site unspecified: Secondary | ICD-10-CM | POA: Diagnosis not present

## 2022-03-27 ENCOUNTER — Encounter: Payer: Self-pay | Admitting: Gastroenterology

## 2022-03-27 ENCOUNTER — Ambulatory Visit (INDEPENDENT_AMBULATORY_CARE_PROVIDER_SITE_OTHER): Payer: Medicare Other | Admitting: Gastroenterology

## 2022-03-27 ENCOUNTER — Telehealth: Payer: Self-pay | Admitting: Gastroenterology

## 2022-03-27 VITALS — BP 124/68 | HR 78 | Temp 97.4°F | Ht 71.0 in | Wt 226.4 lb

## 2022-03-27 DIAGNOSIS — K580 Irritable bowel syndrome with diarrhea: Secondary | ICD-10-CM

## 2022-03-27 DIAGNOSIS — Z8719 Personal history of other diseases of the digestive system: Secondary | ICD-10-CM

## 2022-03-27 DIAGNOSIS — R1319 Other dysphagia: Secondary | ICD-10-CM

## 2022-03-27 DIAGNOSIS — K219 Gastro-esophageal reflux disease without esophagitis: Secondary | ICD-10-CM | POA: Diagnosis not present

## 2022-03-27 MED ORDER — VIBERZI 75 MG PO TABS: 75.0000 mg | ORAL_TABLET | Freq: Two times a day (BID) | ORAL | 1 refills | Status: DC

## 2022-03-27 MED ORDER — DICYCLOMINE HCL 10 MG PO CAPS: ORAL_CAPSULE | ORAL | 1 refills | Status: DC

## 2022-03-27 NOTE — Progress Notes (Signed)
GI Office Note    Referring Provider: Kirstie Peri, MD Primary Care Physician:  Kirstie Peri, MD  Primary GI: Dr. Jena Gauss  Chief Complaint   Chief Complaint  Patient presents with   Abdominal Pain    Diarrhea and stomach pain      History of Present Illness   [name] is a 56 y.o. female with a history of arthritis, cervicalgia with chronic pain, CVA in August 2021, depression, diabetes, fibromyalgia, GERD, IBS-D, and fatty liver presenting today at the request of Kirstie Peri, MD for diarrhea and abdominal pain.   Last colonoscopy 05/12/2018 -pancolonic diverticulosis, otherwise normal.Repeat in 5 years.   Last EGD 05/12/2018 -normal esophagus s/p empiric dilation, normal stomach, normal duodenum  Last seen in our office 12/17/2018 -she presented with IBS with predominant diarrhea.  Celiac serologies negative in the past.  Having postprandial watery stool with urgency and intermittent cramping.  At this time she is stating that her bad days outweigh her good.  She was actively trying to lose weight, eat healthy, exercise.  She was given trial of Viberzi 75 mg p.o. twice daily, would be given a round of Xifaxan if she had no improvement.  She was advised to return in 3 months for follow-up, this was supposed to be video visit however staff was not able to contact the patient.  Prior visit to this was in March 2019 when she was having epigastric and right upper quadrant pain, ultrasound abdomen with fatty liver.  She was prescribed Bentyl which she stated was not helpful.   Today: After every meal she is having stools. It is loose/watery. Going about 4 times a day for about a year. Does not remember if it helped or not. Does not matter how the food is cooked. She can eat green beans, corn she can see that in her stool.  Belly pain is mostly mid abdomen or lower, most of the time it does get better after defecation. Does go within minutes of eating and drinking. Stabbing, cramping,  twisting pain that lasts about 5-10 after using the bathroom. Has done it a few times when not using the bathroom. Not having nocturnal stools.   Reports she has had some black stools and the smell is really bad but does not happen often, about 3 times a month. No weird cravings. Does not sleep well from insomnia. No BRBPR. Sometimes will vomit and be pooping at the same time, happened twice within the last year.   Does report some dysphagia at the base of her neck, never regurgitates the food. Saliva, pills, solids and liquids. Has noted relief in the past. Denies reflux symptoms. On Dexilant daily.   Due for colonoscopy June 2024.  Past Medical History:  Diagnosis Date   Arthritis    BMI (body mass index) 20.0-29.9 2008 192 lbs   Cervicalgia    Claustrophobia    Complication of anesthesia    post op N/V   CVA (cerebral vascular accident) (HCC) 06/2020   Depression    Depressive disorder    Diabetes mellitus    type 2   Fibromyalgia    GERD (gastroesophageal reflux disease) 2002   202 lbs   Goiter    Hypercholesteremia    Hypertension    Insomnia    Irritable bowel syndrome 2008   diarrhea predominant MEDS TRIED: BENTYL, LEVBID   LBP (low back pain)    Migraine    PONV (postoperative nausea and vomiting)  PTSD (post-traumatic stress disorder)    RUQ abdominal pain 2004 HIDA NL CTA w/ IVC-FATTY LIVER, ANGIOMYOLIPOMA LEFT KIDNEY   2008 HIDA NL   TIA (transient ischemic attack) 06/2020    Past Surgical History:  Procedure Laterality Date   ANKLE SURGERY  RIGHT   BREAST REDUCTION SURGERY  2006   COLONOSCOPY  2008 RMR   PAN-COLONIC TICS, NL TI   COLONOSCOPY  2013   Dr. Teena Dunk: diverticulum in ascending colon, mild diverticulosis in sigmoid. Recommended 5 year surveillance   COLONOSCOPY WITH PROPOFOL N/A 05/12/2018   Dr. Jena Gauss: pancolonic diverticulosis, otherwise normal   ESOPHAGEAL DILATION N/A 04/06/2015   Procedure: ESOPHAGEAL DILATION;  Surgeon: Corbin Ade, MD;   Location: [facility];  Service: Gastroenterology;  Laterality: N/A;  300   ESOPHAGOGASTRODUODENOSCOPY N/A 04/06/2015   RMR: Normal EGD status post Maloney dilation of teh esophagus as described above   ESOPHAGOGASTRODUODENOSCOPY (EGD) WITH PROPOFOL N/A 05/12/2018   Dr. Jena Gauss: normal esophagus s/p dilation, normal stomach, normal duodenum   LAPAROSCOPIC BILATERAL SALPINGO OOPHERECTOMY Bilateral 11/22/2015   Procedure: LAPAROSCOPIC BILATERAL SALPINGO OOPHORECTOMY;  Surgeon: Tilda Burrow, MD;  Location: [facility];  Service: Gynecology;  Laterality: Bilateral;   LUMBAR DISC SURGERY  2005   x2   MALONEY DILATION N/A 05/12/2018   Procedure: Elease Hashimoto DILATION;  Surgeon: Corbin Ade, MD;  Location: [facility];  Service: Endoscopy;  Laterality: N/A;   SHOULDER SURGERY  1997 RIGHT   TOTAL ABDOMINAL HYSTERECTOMY  2000 fibroids/DUB   UPPER GASTROINTESTINAL ENDOSCOPY  2008 RMR   [facility] o/w NL   vocal cord polyp removal  2013   Dr. Josephina Shih    Current Outpatient Medications  Medication Sig Dispense Refill   amLODipine (NORVASC) 2.5 MG tablet Take 2.5 mg by mouth 2 (two) times daily. For BP 140/90 or >     atorvastatin (LIPITOR) 10 MG tablet Take 10 mg by mouth daily.      buPROPion (WELLBUTRIN XL) 150 MG 24 hr tablet Take 150 mg by mouth daily.  3   busPIRone (BUSPAR) 15 MG tablet Take 15 mg by mouth 3 (three) times daily.  4   busPIRone (BUSPAR) 15 MG tablet Take by mouth.     Cholecalciferol (VITAMIN D-3) 5000 units TABS Take 5,000 Units by mouth daily.     clopidogrel (PLAVIX) 75 MG tablet Take 75 mg by mouth daily.     clopidogrel (PLAVIX) 75 MG tablet Take 1 tablet by mouth daily.     DEXILANT 60 MG capsule TAKE 1 CAPSULE BY MOUTH ONCE A DAY. 30 capsule 3   DULoxetine (CYMBALTA) 60 MG capsule Take 1 capsule (60 mg total) by mouth daily. 30 capsule 0   fexofenadine (ALLEGRA) 180 MG tablet Take 180 mg by mouth daily.     fluticasone (FLONASE) 50 MCG/ACT nasal spray Place 2 sprays into  both nostrils daily as needed for allergies.      furosemide (LASIX) 20 MG tablet Take 10 mg by mouth daily as needed for fluid.      lidocaine (LIDODERM) 5 % Place 1 patch onto the skin daily as needed (pain). Remove & Discard patch within 12 hours or as directed by MD     liraglutide (VICTOZA) 18 MG/3ML SOPN Inject 1.8 mg into the skin at bedtime.      metFORMIN (GLUCOPHAGE) 500 MG tablet Take 1,000 mg by mouth 2 (two) times daily with a meal.     metoprolol (LOPRESSOR) 100 MG tablet Take  100 mg by mouth daily.      Multiple Vitamin (MULTIVITAMIN WITH MINERALS) TABS tablet Take 1 tablet by mouth daily.     oxyCODONE-acetaminophen (PERCOCET) 10-325 MG tablet Take 1 tablet by mouth every 4 (four) hours as needed for pain.   0   pioglitazone (ACTOS) 15 MG tablet Take 15 mg by mouth daily.  2   Probiotic Product (PHILLIPS COLON HEALTH PO) Take 1 capsule by mouth daily.     valsartan-hydrochlorothiazide (DIOVAN-HCT) 160-25 MG per tablet Take 1 tablet by mouth daily.     VOLTAREN 1 % GEL Apply 2 g topically daily as needed (pain).      aspirin 81 MG tablet Take 81 mg by mouth daily.  (Patient not taking: Reported on 03/27/2022)     COMBIVENT RESPIMAT 20-100 MCG/ACT AERS respimat Inhale 2 puffs into the lungs every 6 (six) hours as needed for wheezing or shortness of breath.      dicyclomine (BENTYL) 10 MG capsule TAKE 1 CAPSULE(10 MG) BY MOUTH FOUR TIMES DAILY BEFORE MEALS AND AT BEDTIME (Patient not taking: Reported on 03/27/2022) 120 capsule 5   meloxicam (MOBIC) 7.5 MG tablet TAKE 1 TABLET(7.5 MG) BY MOUTH DAILY (Patient not taking: Reported on 03/27/2022) 30 tablet 5   Polyethyl Glycol-Propyl Glycol 0.4-0.3 % SOLN Place 1-2 drops into both eyes 3 (three) times daily as needed (for dry eyes.). (Patient not taking: Reported on 03/27/2022)     No current facility-administered medications for this visit.    Allergies as of 03/27/2022 - Review Complete 03/27/2022  Allergen Reaction Noted   Neurontin  [gabapentin] Other (See Comments) 03/24/2013    Family History  Problem Relation Age of Onset   Anxiety disorder Mother    Stroke Mother    COPD Mother    Hypertension Mother    Thyroid disease Mother    Diabetes Mother    Seizures Sister    Anxiety disorder Sister    Stroke Sister    Breast cancer Sister    Anxiety disorder Sister    Drug abuse Sister    Early death Sister    Heart disease Sister    Heart attack Sister    Drug abuse Daughter    Anxiety disorder Daughter    Breast cancer Maternal Aunt    Anxiety disorder Maternal Aunt    Dementia Maternal Aunt    Colon cancer Maternal Aunt        in her 55s   Pancreatic cancer Maternal Aunt        great aunt   Alcohol abuse Maternal Uncle    Stroke Maternal Grandmother    Mental illness Cousin    Colon polyps Neg Hx    ADD / ADHD Neg Hx    Bipolar disorder Neg Hx    Depression Neg Hx    OCD Neg Hx    Paranoid behavior Neg Hx    Schizophrenia Neg Hx    Sexual abuse Neg Hx    Physical abuse Neg Hx     Social History   Socioeconomic History   Marital status: Single    Spouse name: Not on file   Number of children: 3   Years of education: 12th   Highest education level: Not on file  Occupational History    Employer: RETIRED    Comment: [employer]  Tobacco Use   Smoking status: Former    Packs/day: 0.50    Years: 2.00    Pack years: 1.00  Types: Cigarettes    Quit date: 06/29/2020    Years since quitting: 1.7   Smokeless tobacco: Never  Vaping Use   Vaping Use: Never used  Substance and Sexual Activity   Alcohol use: No    Alcohol/week: 0.0 standard drinks   Drug use: No   Sexual activity: Yes    Birth control/protection: Surgical  Other Topics Concern   Not on file  Social History Narrative   Patient lives at home with her family. Raising two grand daughters, daughter passed away 04/09/2016   Caffeine Use: 2 cups of coffee daily   Social Determinants of Health   Financial Resource Strain: Not  on file  Food Insecurity: Not on file  Transportation Needs: Not on file  Physical Activity: Not on file  Stress: Not on file  Social Connections: Not on file  Intimate Partner Violence: Not on file     Review of Systems   Gen: Denies any fever, chills, fatigue, weight loss, lack of appetite.  CV: Denies chest pain, heart palpitations, peripheral edema, syncope.  Resp: Denies shortness of breath at rest or with exertion. Denies wheezing or cough.  GI: see HPI GU : Denies urinary burning, urinary frequency, urinary hesitancy MS: Denies joint pain, muscle weakness, cramps, or limitation of movement.  Derm: Denies rash, itching, dry skin Psych: Denies depression, anxiety, memory loss, and confusion Heme: Denies bruising, bleeding, and enlarged lymph nodes.   Physical Exam   BP 124/68   Pulse 78   Temp (!) 97.4 F (36.3 C) (Temporal)   Ht 5\' 11"  (1.803 m)   Wt 226 lb 6.4 oz (102.7 kg)   BMI 31.58 kg/m   General:   Alert and oriented. Pleasant and cooperative. Well-nourished and well-developed.  Head:  Normocephalic and atraumatic. Eyes:  Without icterus, sclera clear and conjunctiva pink.  Ears:  Normal auditory acuity. Mouth:  No deformity or lesions, oral mucosa pink.  Lungs:  Clear to auscultation bilaterally. No wheezes, rales, or rhonchi. No distress.  Heart:  S1, S2 present without murmurs appreciated.  Abdomen:  +BS, soft, non-tender and non-distended. No HSM noted. No guarding or rebound. No masses appreciated.  Rectal:  Deferred  Msk:  Symmetrical without gross deformities. Normal posture. Extremities:  Without edema. Neurologic:  Alert and  oriented x4;  grossly normal neurologically. Skin:  Intact without significant lesions or rashes. Psych:  Alert and cooperative. Normal mood and affect.   Assessment   [name] is a 56 y.o. female with a history of arthritis, cervicalgia with chronic pain, CVA, depression, diabetes, fibromyalgia, GERD, HTN, HLD,  IBS-D, migraines presenting today with complaint of diarrhea, abdominal pain, and dysphagia.   IBS-D: Last seen for the same issue in 2020.  Work-up has been negative for celiac serologies and prior normal colonoscopy.  This has been a chronic issue for her at her last visit in 2020 she was given trial of Viberzi 75 mg twice daily, however she does not remember if this helped at all and did not call back in with a progress report.  Symptoms are exacerbated by each meal with solids and liquids.  She is having about 4 loose stools a day associated with a cramping/stabbing abdominal pain that would last for about 5 to 10 minutes after she defecates and then improves.  She reports her stools have been very dark/black about 3 times a month, denies any frequent NSAID use or Pepto-Bismol.  Patient has chronic fatigue, no other alarm symptoms.  Historically  Bentyl had not been helpful for her symptoms.  Provided her with dietary handout on IBS, again gave her samples of Viberzi 75 mg to take twice daily with food and to please call this time with a progress report, prescription sent to her pharmacy.  She was given discount card and advised to call the office if medication was too expensive or had no improvement.  Continue Bentyl as needed.  Could consider round of Xifaxan if this is the case.  Dysphagia/GERD: Denies reflux symptoms, well controlled on Dexilant 60 mg daily.  Does report some recent worsening of dysphagia with solids, liquids, and saliva as well as pills.  She reports feeling things are stuck at the base of her throat.  She notes she had a dilation after her last EGD and had improvement of symptoms at that time.  Continue Dexilant 60 mg daily.  We will schedule for EGD with dilation with Dr. Jena Gauss in the near future for dysphagia and to evaluate further for possible melena.  Patient denies history of anemia.  Due for colonoscopy June 2024.  PLAN   Viberzi 75 mg BID with food, samples provided.   Prescription sent to pharmacy, may need patient assistance if insurance does not cover. Dicyclomine 10 mg QID as needed. Continue Dexilant 60 mg daily Proceed with upper endoscopy with dilation by Dr. Jena Gauss in near future: the risks, benefits, and alternatives have been discussed with the patient in detail. The patient states understanding and desires to proceed.  ASA 3 We will need to take half normal dose of Victoza night prior to procedure, hold oral diabetic medications night prior to procedure morning of procedure. Follow-up in 3 months   Brooke Bonito, MSN, FNP-BC, AGACNP-BC [facility]

## 2022-03-27 NOTE — Patient Instructions (Addendum)
We are scheduling you for an EGD with dilation with Dr. Gala Romney in the near future. ?Medication adjustments for procedure: ?Take half your normal dose of Victoza (0.9 mg) the night prior to procedure ?Hold your oral diabetes medication night prior and morning of procedure ? ?I am providing you with samples of Viberzi 75 mg for you to take twice daily with food.  I sent in prescription to your pharmacy.  Please let me know within the next week or 2 if this is improving your symptoms or not.  We can consider course of Xifaxan if this is not helpful or is unaffordable.  I am also providing you with a discount card and we could submit for patient assistance if needed.  Continue to take Bentyl as needed. ? ?Please avoid lactose containing products and any fried/greasy foods.  ? ?Continue Dexilant 60 mg daily. ? ?It was a pleasure to see you today. I want to create trusting relationships with patients. If you receive a survey regarding your visit,  I greatly appreciate you taking time to fill this out on paper or through your MyChart. I value your feedback. ? ?Venetia Night, MSN, FNP-BC, AGACNP-BC ?[facility] ? ? ?

## 2022-03-27 NOTE — Telephone Encounter (Signed)
Please schedule patient for 74-month follow-up. ? ?Cynthia Night, MSN, FNP-BC, AGACNP-BC ?[facility] ? ?

## 2022-03-28 ENCOUNTER — Encounter: Payer: Self-pay | Admitting: Internal Medicine

## 2022-03-30 ENCOUNTER — Telehealth: Payer: Self-pay

## 2022-03-30 NOTE — Telephone Encounter (Signed)
Tried to call pt to schedule EGD/DIL ASA 3 w/Dr. Gala Romney, LMOVM for return call. ?

## 2022-04-04 NOTE — Telephone Encounter (Signed)
Patient called back. Aware Dr. Jena Gauss schedule currently full and will call once we have his future schedule for July. She voiced understanding ?

## 2022-04-12 ENCOUNTER — Encounter: Payer: Self-pay | Admitting: *Deleted

## 2022-04-12 NOTE — Telephone Encounter (Signed)
Called pt. She has been scheduled for 7/12 at 11am. Aware will mail prep instructions/pre-op appt.  PA submitted via [employer]. Auth# [id number], DOS: May 30, 2022 - Aug 28, 2022

## 2022-04-17 ENCOUNTER — Telehealth: Payer: Self-pay | Admitting: *Deleted

## 2022-04-17 DIAGNOSIS — M25561 Pain in right knee: Secondary | ICD-10-CM | POA: Diagnosis not present

## 2022-04-17 DIAGNOSIS — I10 Essential (primary) hypertension: Secondary | ICD-10-CM | POA: Diagnosis not present

## 2022-04-17 DIAGNOSIS — M25562 Pain in left knee: Secondary | ICD-10-CM | POA: Diagnosis not present

## 2022-04-17 DIAGNOSIS — M542 Cervicalgia: Secondary | ICD-10-CM | POA: Diagnosis not present

## 2022-04-17 DIAGNOSIS — Z79891 Long term (current) use of opiate analgesic: Secondary | ICD-10-CM | POA: Diagnosis not present

## 2022-04-17 NOTE — Telephone Encounter (Signed)
Received letter from [employer] about pt's Viberzi 75mg . [employer] sent her a temporary supple of Viberzi. But, this medication is not on her formulary.They would like her to try Loperamide. I mailed her a patient assist form.

## 2022-04-18 NOTE — Telephone Encounter (Signed)
Noted. Informed pt to call office if she has any problems fill out form.

## 2022-05-01 ENCOUNTER — Telehealth: Payer: Self-pay | Admitting: *Deleted

## 2022-05-01 DIAGNOSIS — K580 Irritable bowel syndrome with diarrhea: Secondary | ICD-10-CM

## 2022-05-01 NOTE — Telephone Encounter (Signed)
Received letter from [employer] [facility] denying Viberzi. I sent an assist form to her and a discount card.

## 2022-05-01 NOTE — Telephone Encounter (Signed)
Your welcome.

## 2022-05-02 NOTE — Telephone Encounter (Signed)
Received letter from [employer] stating that the patient needs to try Xifaxan before they will approve the Viberzi.

## 2022-05-03 MED ORDER — RIFAXIMIN 550 MG PO TABS: 550.0000 mg | ORAL_TABLET | Freq: Three times a day (TID) | ORAL | 0 refills | Status: DC

## 2022-05-03 NOTE — Telephone Encounter (Signed)
Noted I will inform the pt.

## 2022-05-03 NOTE — Addendum Note (Signed)
Addended by: Aida Raider on: 05/03/2022 08:41 AM   Modules accepted: Orders

## 2022-05-07 ENCOUNTER — Telehealth: Payer: Self-pay | Admitting: *Deleted

## 2022-05-07 DIAGNOSIS — K580 Irritable bowel syndrome with diarrhea: Secondary | ICD-10-CM

## 2022-05-07 MED ORDER — RIFAXIMIN 550 MG PO TABS: 550.0000 mg | ORAL_TABLET | Freq: Three times a day (TID) | ORAL | 0 refills | Status: AC

## 2022-05-07 NOTE — Addendum Note (Signed)
Addended by: Aida Raider on: 05/07/2022 02:44 PM   Modules accepted: Orders

## 2022-05-07 NOTE — Telephone Encounter (Signed)
You may need to resend it electronically

## 2022-05-07 NOTE — Telephone Encounter (Signed)
Received letter from [facility] stated that Xifaxan was approved. Copy sent to scan center.

## 2022-05-07 NOTE — Telephone Encounter (Signed)
Refill request for Xifaxan 550mg . Pt last OV 03/27/22

## 2022-05-07 NOTE — Telephone Encounter (Signed)
A paper request came from [facility]

## 2022-05-15 DIAGNOSIS — G894 Chronic pain syndrome: Secondary | ICD-10-CM | POA: Diagnosis not present

## 2022-05-15 DIAGNOSIS — M4727 Other spondylosis with radiculopathy, lumbosacral region: Secondary | ICD-10-CM | POA: Diagnosis not present

## 2022-05-15 DIAGNOSIS — Z79891 Long term (current) use of opiate analgesic: Secondary | ICD-10-CM | POA: Diagnosis not present

## 2022-05-17 DIAGNOSIS — I10 Essential (primary) hypertension: Secondary | ICD-10-CM | POA: Diagnosis not present

## 2022-05-21 ENCOUNTER — Telehealth: Payer: Self-pay

## 2022-05-21 NOTE — Telephone Encounter (Signed)
Noted  

## 2022-05-21 NOTE — Telephone Encounter (Signed)
Refill request for Xifaxan 550 mg tablets was received from [facility] qty: 42. Last ov was 03/27/22.

## 2022-05-25 NOTE — Patient Instructions (Signed)
[name]  05/25/2022     @PREFPERIOPPHARMACY @   Your procedure is scheduled on  05/30/2022.   Report to [facility] at  0900  A.M.   Call this number if you have problems the morning of surgery:  [phone]   Remember:  Follow the diet instructions given to you by the office.    The night before your procedure, take 1/2 of your usual victoza dose and no metformin.    Use your inhalers before you come and bring your rescue inhaler with you.     DO NOT take any medications for diabetes the morning of your procedure.    Take these medicines the morning of surgery with A SIP OF WATER            norvasc, buspar, dexilant, cymbalta, allegra, lopressor, percocet(if needed), lyrica.     Do not wear jewelry, make-up or nail polish.  Do not wear lotions, powders, or perfumes, or deodorant.  Do not shave 48 hours prior to surgery.  Men may shave face and neck.  Do not bring valuables to the hospital.  [facility] is not responsible for any belongings or valuables.  Contacts, dentures or bridgework may not be worn into surgery.  Leave your suitcase in the car.  After surgery it may be brought to your room.  For patients admitted to the hospital, discharge time will be determined by your treatment team.  Patients discharged the day of surgery will not be allowed to drive home and must have someone with them for 24 hours.    Special instructions:   DO NOT smoke tobacco or vape for 24 hours before your procedure.  Please read over the following fact sheets that you were given. Anesthesia Post-op Instructions and Care and Recovery After Surgery      Upper Endoscopy, Adult, Care After This sheet gives you information about how to care for yourself after your procedure. Your health care provider may also give you more specific instructions. If you have problems or questions, contact your health care provider. What can I expect after the procedure? After the procedure,  it is common to have: A sore throat. Mild stomach pain or discomfort. Bloating. Nausea. Follow these instructions at home:  Follow instructions from your health care provider about what to eat or drink after your procedure. Return to your normal activities as told by your health care provider. Ask your health care provider what activities are safe for you. Take over-the-counter and prescription medicines only as told by your health care provider. If you were given a sedative during the procedure, it can affect you for several hours. Do not drive or operate machinery until your health care provider says that it is safe. Keep all follow-up visits as told by your health care provider. This is important. Contact a health care provider if you have: A sore throat that lasts longer than one day. Trouble swallowing. Get help right away if: You vomit blood or your vomit looks like coffee grounds. You have: A fever. Bloody, black, or tarry stools. A severe sore throat or you cannot swallow. Difficulty breathing. Severe pain in your chest or abdomen. Summary After the procedure, it is common to have a sore throat, mild stomach discomfort, bloating, and nausea. If you were given a sedative during the procedure, it can affect you for several hours. Do not drive or operate machinery until your health care provider says that it is  safe. Follow instructions from your health care provider about what to eat or drink after your procedure. Return to your normal activities as told by your health care provider. This information is not intended to replace advice given to you by your health care provider. Make sure you discuss any questions you have with your health care provider. Document Revised: 09/11/2019 Document Reviewed: 04/07/2018 Elsevier Patient Education  2023 [employer] Esophageal Dilatation Esophageal dilatation, also called esophageal dilation, is a procedure to widen or open a blocked or  narrowed part of the esophagus. The esophagus is the part of the body that moves food and liquid from the mouth to the stomach. You may need this procedure if: You have a buildup of scar tissue in your esophagus that makes it difficult, painful, or impossible to swallow. This can be caused by gastroesophageal reflux disease (GERD). You have cancer of the esophagus. There is a problem with how food moves through your esophagus. In some cases, you may need this procedure repeated at a later time to dilate the esophagus gradually. Tell a health care provider about: Any allergies you have. All medicines you are taking, including vitamins, herbs, eye drops, creams, and over-the-counter medicines. Any problems you or family members have had with anesthetic medicines. Any blood disorders you have. Any surgeries you have had. Any medical conditions you have. Any antibiotic medicines you are required to take before dental procedures. Whether you are pregnant or may be pregnant. What are the risks? Generally, this is a safe procedure. However, problems may occur, including: Bleeding due to a tear in the lining of the esophagus. A hole, or perforation, in the esophagus. What happens before the procedure? Ask your health care provider about: Changing or stopping your regular medicines. This is especially important if you are taking diabetes medicines or blood thinners. Taking medicines such as aspirin and ibuprofen. These medicines can thin your blood. Do not take these medicines unless your health care provider tells you to take them. Taking over-the-counter medicines, vitamins, herbs, and supplements. Follow instructions from your health care provider about eating or drinking restrictions. Plan to have a responsible adult take you home from the hospital or clinic. Plan to have a responsible adult care for you for the time you are told after you leave the hospital or clinic. This is important. What  happens during the procedure? You may be given a medicine to help you relax (sedative). A numbing medicine may be sprayed into the back of your throat, or you may gargle the medicine. Your health care provider may perform the dilatation using various surgical instruments, such as: Simple dilators. This instrument is carefully placed in the esophagus to stretch it. Guided wire bougies. This involves using an endoscope to insert a wire into the esophagus. A dilator is passed over this wire to enlarge the esophagus. Then the wire is removed. Balloon dilators. An endoscope with a small balloon is inserted into the esophagus. The balloon is inflated to stretch the esophagus and open it up. The procedure may vary among health care providers and hospitals. What can I expect after the procedure? Your blood pressure, heart rate, breathing rate, and blood oxygen level will be monitored until you leave the hospital or clinic. Your throat may feel slightly sore and numb. This will get better over time. You will not be allowed to eat or drink until your throat is no longer numb. When you are able to drink, urinate, and sit on the edge of  the bed without nausea or dizziness, you may be able to return home. Follow these instructions at home: Take over-the-counter and prescription medicines only as told by your health care provider. If you were given a sedative during the procedure, it can affect you for several hours. Do not drive or operate machinery until your health care provider says that it is safe. Plan to have a responsible adult care for you for the time you are told. This is important. Follow instructions from your health care provider about any eating or drinking restrictions. Do not use any products that contain nicotine or tobacco, such as cigarettes, e-cigarettes, and chewing tobacco. If you need help quitting, ask your health care provider. Keep all follow-up visits. This is important. Contact a  health care provider if: You have a fever. You have pain that is not relieved by medicine. Get help right away if: You have chest pain. You have trouble breathing. You have trouble swallowing. You vomit blood. You have black, tarry, or bloody stools. These symptoms may represent a serious problem that is an emergency. Do not wait to see if the symptoms will go away. Get medical help right away. Call your local emergency services (911 in the [place] Do not drive yourself to the hospital. Summary Esophageal dilatation, also called esophageal dilation, is a procedure to widen or open a blocked or narrowed part of the esophagus. Plan to have a responsible adult take you home from the hospital or clinic. For this procedure, a numbing medicine may be sprayed into the back of your throat, or you may gargle the medicine. Do not drive or operate machinery until your health care provider says that it is safe. This information is not intended to replace advice given to you by your health care provider. Make sure you discuss any questions you have with your health care provider. Document Revised: 03/23/2020 Document Reviewed: 03/23/2020 Elsevier Patient Education  2023 [employer] Monitored Anesthesia Care, Care After This sheet gives you information about how to care for yourself after your procedure. Your health care provider may also give you more specific instructions. If you have problems or questions, contact your health care provider. What can I expect after the procedure? After the procedure, it is common to have: Tiredness. Forgetfulness about what happened after the procedure. Impaired judgment for important decisions. Nausea or vomiting. Some difficulty with balance. Follow these instructions at home: For the time period you were told by your health care provider:     Rest as needed. Do not participate in activities where you could fall or become injured. Do not drive or use  machinery. Do not drink alcohol. Do not take sleeping pills or medicines that cause drowsiness. Do not make important decisions or sign legal documents. Do not take care of children on your own. Eating and drinking Follow the diet that is recommended by your health care provider. Drink enough fluid to keep your urine pale yellow. If you vomit: Drink water, juice, or soup when you can drink without vomiting. Make sure you have little or no nausea before eating solid foods. General instructions Have a responsible adult stay with you for the time you are told. It is important to have someone help care for you until you are awake and alert. Take over-the-counter and prescription medicines only as told by your health care provider. If you have sleep apnea, surgery and certain medicines can increase your risk for breathing problems. Follow instructions from your health care provider  about wearing your sleep device: Anytime you are sleeping, including during daytime naps. While taking prescription pain medicines, sleeping medicines, or medicines that make you drowsy. Avoid smoking. Keep all follow-up visits as told by your health care provider. This is important. Contact a health care provider if: You keep feeling nauseous or you keep vomiting. You feel light-headed. You are still sleepy or having trouble with balance after 24 hours. You develop a rash. You have a fever. You have redness or swelling around the IV site. Get help right away if: You have trouble breathing. You have new-onset confusion at home. Summary For several hours after your procedure, you may feel tired. You may also be forgetful and have poor judgment. Have a responsible adult stay with you for the time you are told. It is important to have someone help care for you until you are awake and alert. Rest as told. Do not drive or operate machinery. Do not drink alcohol or take sleeping pills. Get help right away if you have  trouble breathing, or if you suddenly become confused. This information is not intended to replace advice given to you by your health care provider. Make sure you discuss any questions you have with your health care provider. Document Revised: 10/10/2021 Document Reviewed: 10/08/2019 Elsevier Patient Education  2023 [employer].

## 2022-05-29 ENCOUNTER — Encounter (HOSPITAL_COMMUNITY): Payer: Self-pay

## 2022-05-29 ENCOUNTER — Telehealth: Payer: Self-pay | Admitting: *Deleted

## 2022-05-29 ENCOUNTER — Encounter: Payer: Self-pay | Admitting: *Deleted

## 2022-05-29 ENCOUNTER — Encounter (HOSPITAL_COMMUNITY)
Admission: RE | Admit: 2022-05-29 | Discharge: 2022-05-29 | Disposition: A | Discharge status: Home / Self Care | Payer: Medicare Other | Source: Ambulatory Visit | Attending: Internal Medicine | Admitting: Internal Medicine

## 2022-05-29 DIAGNOSIS — Z7189 Other specified counseling: Secondary | ICD-10-CM | POA: Diagnosis not present

## 2022-05-29 DIAGNOSIS — R5383 Other fatigue: Secondary | ICD-10-CM | POA: Diagnosis not present

## 2022-05-29 DIAGNOSIS — Z0000 Encounter for general adult medical examination without abnormal findings: Secondary | ICD-10-CM | POA: Diagnosis not present

## 2022-05-29 DIAGNOSIS — E119 Type 2 diabetes mellitus without complications: Secondary | ICD-10-CM

## 2022-05-29 DIAGNOSIS — I10 Essential (primary) hypertension: Secondary | ICD-10-CM | POA: Diagnosis not present

## 2022-05-29 DIAGNOSIS — E7800 Pure hypercholesterolemia, unspecified: Secondary | ICD-10-CM | POA: Diagnosis not present

## 2022-05-29 DIAGNOSIS — E1165 Type 2 diabetes mellitus with hyperglycemia: Secondary | ICD-10-CM | POA: Diagnosis not present

## 2022-05-29 DIAGNOSIS — Z299 Encounter for prophylactic measures, unspecified: Secondary | ICD-10-CM | POA: Diagnosis not present

## 2022-05-29 DIAGNOSIS — Z79899 Other long term (current) drug therapy: Secondary | ICD-10-CM | POA: Diagnosis not present

## 2022-05-29 NOTE — Pre-Procedure Instructions (Signed)
[name] @ [facility] notified that patient did not show for p-reop this morning.

## 2022-05-29 NOTE — Telephone Encounter (Signed)
-----   Message from Elsie Amis, RN sent at 05/29/2022  9:57 AM EDT ----- Regarding: no show Good morning! Para March did not show for her pre-op this morning.

## 2022-05-29 NOTE — Telephone Encounter (Signed)
Called pt. She had her dates confused. Patient rescheduled to 8/9 at 2pm. Aware will mail new instructions and new pre-op appt. Endo aware.

## 2022-05-31 DIAGNOSIS — R531 Weakness: Secondary | ICD-10-CM | POA: Diagnosis not present

## 2022-05-31 DIAGNOSIS — M2569 Stiffness of other specified joint, not elsewhere classified: Secondary | ICD-10-CM | POA: Diagnosis not present

## 2022-05-31 DIAGNOSIS — M542 Cervicalgia: Secondary | ICD-10-CM | POA: Diagnosis not present

## 2022-06-11 DIAGNOSIS — I739 Peripheral vascular disease, unspecified: Secondary | ICD-10-CM | POA: Diagnosis not present

## 2022-06-13 DIAGNOSIS — M4727 Other spondylosis with radiculopathy, lumbosacral region: Secondary | ICD-10-CM | POA: Diagnosis not present

## 2022-06-13 DIAGNOSIS — G894 Chronic pain syndrome: Secondary | ICD-10-CM | POA: Diagnosis not present

## 2022-06-13 DIAGNOSIS — Z79891 Long term (current) use of opiate analgesic: Secondary | ICD-10-CM | POA: Diagnosis not present

## 2022-06-18 DIAGNOSIS — I10 Essential (primary) hypertension: Secondary | ICD-10-CM | POA: Diagnosis not present

## 2022-06-21 NOTE — Patient Instructions (Signed)
[name]  06/21/2022     @PREFPERIOPPHARMACY @   Your procedure is scheduled on 06/27/2022.   Report to [facility] at  1200  P.M.   Call this number if you have problems the morning of surgery:  [phone]   Remember:  Follow the diet and prep instructions given to you by the office.     Use your inhaler before you come and bring your rescue inhaler with you.      Take 1/2 of your usual victoza dose the night before your procedure.      DO NOT take any medications for diabetes the morning of your procedure.     Take these medicines the morning of surgery with A SIP OF WATER         percocet(if needed), norvasc, buspar, dexilant, cymbalta, allegra, lopressor.     Do not wear jewelry, make-up or nail polish.  Do not wear lotions, powders, or perfumes, or deodorant.  Do not shave 48 hours prior to surgery.  Men may shave face and neck.  Do not bring valuables to the hospital.  [facility] is not responsible for any belongings or valuables.  Contacts, dentures or bridgework may not be worn into surgery.  Leave your suitcase in the car.  After surgery it may be brought to your room.  For patients admitted to the hospital, discharge time will be determined by your treatment team.  Patients discharged the day of surgery will not be allowed to drive home and must have someone with them for 24 hours.    Special instructions:   DO NOT smoke tobacco or vape for 24 hours before your procedure.  Please read over the following fact sheets that you were given. Anesthesia Post-op Instructions and Care and Recovery After Surgery      Upper Endoscopy, Adult, Care After After the procedure, it is common to have a sore throat. It is also common to have: Mild stomach pain or discomfort. Bloating. Nausea. Follow these instructions at home: The instructions below may help you care for yourself at home. Your health care provider may give you more instructions. If you  have questions, ask your health care provider. If you were given a sedative during the procedure, it can affect you for several hours. Do not drive or operate machinery until your health care provider says that it is safe. If you will be going home right after the procedure, plan to have a responsible adult: Take you home from the hospital or clinic. You will not be allowed to drive. Care for you for the time you are told. Follow instructions from your health care provider about what you may eat and drink. Return to your normal activities as told by your health care provider. Ask your health care provider what activities are safe for you. Take over-the-counter and prescription medicines only as told by your health care provider. Contact a health care provider if you: Have a sore throat that lasts longer than one day. Have trouble swallowing. Have a fever. Get help right away if you: Vomit blood or your vomit looks like coffee grounds. Have bloody, black, or tarry stools. Have a very bad sore throat or you cannot swallow. Have difficulty breathing or very bad pain in your chest or abdomen. These symptoms may be an emergency. Get help right away. Call 911. Do not wait to see if the symptoms will go away. Do not drive yourself to the  hospital. Summary After the procedure, it is common to have a sore throat, mild stomach discomfort, bloating, and nausea. If you were given a sedative during the procedure, it can affect you for several hours. Do not drive until your health care provider says that it is safe. Follow instructions from your health care provider about what you may eat and drink. Return to your normal activities as told by your health care provider. This information is not intended to replace advice given to you by your health care provider. Make sure you discuss any questions you have with your health care provider. Document Revised: 02/14/2022 Document Reviewed: 02/14/2022 Elsevier  Patient Education  [place]. Esophageal Dilatation Esophageal dilatation, also called esophageal dilation, is a procedure to widen or open a blocked or narrowed part of the esophagus. The esophagus is the part of the body that moves food and liquid from the mouth to the stomach. You may need this procedure if: You have a buildup of scar tissue in your esophagus that makes it difficult, painful, or impossible to swallow. This can be caused by gastroesophageal reflux disease (GERD). You have cancer of the esophagus. There is a problem with how food moves through your esophagus. In some cases, you may need this procedure repeated at a later time to dilate the esophagus gradually. Tell a health care provider about: Any allergies you have. All medicines you are taking, including vitamins, herbs, eye drops, creams, and over-the-counter medicines. Any problems you or family members have had with anesthetic medicines. Any blood disorders you have. Any surgeries you have had. Any medical conditions you have. Any antibiotic medicines you are required to take before dental procedures. Whether you are pregnant or may be pregnant. What are the risks? Generally, this is a safe procedure. However, problems may occur, including: Bleeding due to a tear in the lining of the esophagus. A hole, or perforation, in the esophagus. What happens before the procedure? Ask your health care provider about: Changing or stopping your regular medicines. This is especially important if you are taking diabetes medicines or blood thinners. Taking medicines such as aspirin and ibuprofen. These medicines can thin your blood. Do not take these medicines unless your health care provider tells you to take them. Taking over-the-counter medicines, vitamins, herbs, and supplements. Follow instructions from your health care provider about eating or drinking restrictions. Plan to have a responsible adult take you home from  the hospital or clinic. Plan to have a responsible adult care for you for the time you are told after you leave the hospital or clinic. This is important. What happens during the procedure? You may be given a medicine to help you relax (sedative). A numbing medicine may be sprayed into the back of your throat, or you may gargle the medicine. Your health care provider may perform the dilatation using various surgical instruments, such as: Simple dilators. This instrument is carefully placed in the esophagus to stretch it. Guided wire bougies. This involves using an endoscope to insert a wire into the esophagus. A dilator is passed over this wire to enlarge the esophagus. Then the wire is removed. Balloon dilators. An endoscope with a small balloon is inserted into the esophagus. The balloon is inflated to stretch the esophagus and open it up. The procedure may vary among health care providers and hospitals. What can I expect after the procedure? Your blood pressure, heart rate, breathing rate, and blood oxygen level will be monitored until you leave the hospital  or clinic. Your throat may feel slightly sore and numb. This will get better over time. You will not be allowed to eat or drink until your throat is no longer numb. When you are able to drink, urinate, and sit on the edge of the bed without nausea or dizziness, you may be able to return home. Follow these instructions at home: Take over-the-counter and prescription medicines only as told by your health care provider. If you were given a sedative during the procedure, it can affect you for several hours. Do not drive or operate machinery until your health care provider says that it is safe. Plan to have a responsible adult care for you for the time you are told. This is important. Follow instructions from your health care provider about any eating or drinking restrictions. Do not use any products that contain nicotine or tobacco, such as  cigarettes, e-cigarettes, and chewing tobacco. If you need help quitting, ask your health care provider. Keep all follow-up visits. This is important. Contact a health care provider if: You have a fever. You have pain that is not relieved by medicine. Get help right away if: You have chest pain. You have trouble breathing. You have trouble swallowing. You vomit blood. You have black, tarry, or bloody stools. These symptoms may represent a serious problem that is an emergency. Do not wait to see if the symptoms will go away. Get medical help right away. Call your local emergency services (911 in the [place] Do not drive yourself to the hospital. Summary Esophageal dilatation, also called esophageal dilation, is a procedure to widen or open a blocked or narrowed part of the esophagus. Plan to have a responsible adult take you home from the hospital or clinic. For this procedure, a numbing medicine may be sprayed into the back of your throat, or you may gargle the medicine. Do not drive or operate machinery until your health care provider says that it is safe. This information is not intended to replace advice given to you by your health care provider. Make sure you discuss any questions you have with your health care provider. Document Revised: 03/23/2020 Document Reviewed: 03/23/2020 Elsevier Patient Education  2023 [employer] Monitored Anesthesia Care, Care After This sheet gives you information about how to care for yourself after your procedure. Your health care provider may also give you more specific instructions. If you have problems or questions, contact your health care provider. What can I expect after the procedure? After the procedure, it is common to have: Tiredness. Forgetfulness about what happened after the procedure. Impaired judgment for important decisions. Nausea or vomiting. Some difficulty with balance. Follow these instructions at home: For the time period you  were told by your health care provider:     Rest as needed. Do not participate in activities where you could fall or become injured. Do not drive or use machinery. Do not drink alcohol. Do not take sleeping pills or medicines that cause drowsiness. Do not make important decisions or sign legal documents. Do not take care of children on your own. Eating and drinking Follow the diet that is recommended by your health care provider. Drink enough fluid to keep your urine pale yellow. If you vomit: Drink water, juice, or soup when you can drink without vomiting. Make sure you have little or no nausea before eating solid foods. General instructions Have a responsible adult stay with you for the time you are told. It is important to have someone help  care for you until you are awake and alert. Take over-the-counter and prescription medicines only as told by your health care provider. If you have sleep apnea, surgery and certain medicines can increase your risk for breathing problems. Follow instructions from your health care provider about wearing your sleep device: Anytime you are sleeping, including during daytime naps. While taking prescription pain medicines, sleeping medicines, or medicines that make you drowsy. Avoid smoking. Keep all follow-up visits as told by your health care provider. This is important. Contact a health care provider if: You keep feeling nauseous or you keep vomiting. You feel light-headed. You are still sleepy or having trouble with balance after 24 hours. You develop a rash. You have a fever. You have redness or swelling around the IV site. Get help right away if: You have trouble breathing. You have new-onset confusion at home. Summary For several hours after your procedure, you may feel tired. You may also be forgetful and have poor judgment. Have a responsible adult stay with you for the time you are told. It is important to have someone help care for you  until you are awake and alert. Rest as told. Do not drive or operate machinery. Do not drink alcohol or take sleeping pills. Get help right away if you have trouble breathing, or if you suddenly become confused. This information is not intended to replace advice given to you by your health care provider. Make sure you discuss any questions you have with your health care provider. Document Revised: 10/10/2021 Document Reviewed: 10/08/2019 Elsevier Patient Education  [place].

## 2022-06-25 ENCOUNTER — Encounter (HOSPITAL_COMMUNITY): Payer: Self-pay

## 2022-06-25 ENCOUNTER — Other Ambulatory Visit: Payer: Self-pay

## 2022-06-25 ENCOUNTER — Encounter (HOSPITAL_COMMUNITY)
Admission: RE | Admit: 2022-06-25 | Discharge: 2022-06-25 | Disposition: A | Discharge status: Home / Self Care | Payer: Medicare Other | Source: Ambulatory Visit | Attending: Internal Medicine | Admitting: Internal Medicine

## 2022-06-25 DIAGNOSIS — I10 Essential (primary) hypertension: Secondary | ICD-10-CM | POA: Insufficient documentation

## 2022-06-25 DIAGNOSIS — E119 Type 2 diabetes mellitus without complications: Secondary | ICD-10-CM | POA: Diagnosis not present

## 2022-06-25 DIAGNOSIS — Z01818 Encounter for other preprocedural examination: Secondary | ICD-10-CM | POA: Insufficient documentation

## 2022-06-25 LAB — BASIC METABOLIC PANEL
Anion gap: 11 (ref 5–15)
BUN: 8 mg/dL (ref 6–20)
CO2: 26 mmol/L (ref 22–32)
Calcium: 9.1 mg/dL (ref 8.9–10.3)
Chloride: 102 mmol/L (ref 98–111)
Creatinine, Ser: 0.91 mg/dL (ref 0.44–1.00)
GFR, Estimated: >60 mL/min (ref >60)
Glucose, Bld: 135 mg/dL — ABNORMAL HIGH (ref 70–99)
Potassium: 3.4 mmol/L — ABNORMAL LOW (ref 3.5–5.1)
Sodium: 139 mmol/L (ref 135–145)

## 2022-06-25 MED ORDER — POTASSIUM CHLORIDE CRYS ER 20 MEQ PO TBCR: 20.0000 meq | EXTENDED_RELEASE_TABLET | Freq: Every day | ORAL | 0 refills | Status: AC

## 2022-06-27 ENCOUNTER — Encounter (HOSPITAL_COMMUNITY)
Admission: RE | Disposition: A | Discharge status: Home / Self Care | Payer: Self-pay | Source: Home / Self Care | Attending: Internal Medicine

## 2022-06-27 ENCOUNTER — Ambulatory Visit (HOSPITAL_BASED_OUTPATIENT_CLINIC_OR_DEPARTMENT_OTHER): Payer: Medicare Other | Admitting: Anesthesiology

## 2022-06-27 ENCOUNTER — Ambulatory Visit (HOSPITAL_COMMUNITY): Payer: Medicare Other | Admitting: Anesthesiology

## 2022-06-27 ENCOUNTER — Ambulatory Visit (HOSPITAL_COMMUNITY)
Admission: RE | Admit: 2022-06-27 | Discharge: 2022-06-27 | Disposition: A | Discharge status: Home / Self Care | Payer: Medicare Other | Attending: Internal Medicine | Admitting: Internal Medicine

## 2022-06-27 ENCOUNTER — Encounter (HOSPITAL_COMMUNITY): Payer: Self-pay | Admitting: Internal Medicine

## 2022-06-27 DIAGNOSIS — Z87891 Personal history of nicotine dependence: Secondary | ICD-10-CM | POA: Insufficient documentation

## 2022-06-27 DIAGNOSIS — Z7984 Long term (current) use of oral hypoglycemic drugs: Secondary | ICD-10-CM | POA: Diagnosis not present

## 2022-06-27 DIAGNOSIS — K580 Irritable bowel syndrome with diarrhea: Secondary | ICD-10-CM | POA: Diagnosis not present

## 2022-06-27 DIAGNOSIS — K222 Esophageal obstruction: Secondary | ICD-10-CM | POA: Diagnosis not present

## 2022-06-27 DIAGNOSIS — R1314 Dysphagia, pharyngoesophageal phase: Secondary | ICD-10-CM | POA: Diagnosis not present

## 2022-06-27 DIAGNOSIS — Z8673 Personal history of transient ischemic attack (TIA), and cerebral infarction without residual deficits: Secondary | ICD-10-CM | POA: Insufficient documentation

## 2022-06-27 DIAGNOSIS — M1990 Unspecified osteoarthritis, unspecified site: Secondary | ICD-10-CM | POA: Diagnosis not present

## 2022-06-27 DIAGNOSIS — K449 Diaphragmatic hernia without obstruction or gangrene: Secondary | ICD-10-CM | POA: Diagnosis not present

## 2022-06-27 DIAGNOSIS — Z79899 Other long term (current) drug therapy: Secondary | ICD-10-CM | POA: Insufficient documentation

## 2022-06-27 DIAGNOSIS — I10 Essential (primary) hypertension: Secondary | ICD-10-CM | POA: Diagnosis not present

## 2022-06-27 DIAGNOSIS — G8929 Other chronic pain: Secondary | ICD-10-CM | POA: Diagnosis not present

## 2022-06-27 DIAGNOSIS — F32A Depression, unspecified: Secondary | ICD-10-CM | POA: Diagnosis not present

## 2022-06-27 DIAGNOSIS — R1310 Dysphagia, unspecified: Secondary | ICD-10-CM | POA: Diagnosis not present

## 2022-06-27 DIAGNOSIS — K219 Gastro-esophageal reflux disease without esophagitis: Secondary | ICD-10-CM | POA: Insufficient documentation

## 2022-06-27 DIAGNOSIS — Z7985 Long-term (current) use of injectable non-insulin antidiabetic drugs: Secondary | ICD-10-CM | POA: Diagnosis not present

## 2022-06-27 DIAGNOSIS — E119 Type 2 diabetes mellitus without complications: Secondary | ICD-10-CM | POA: Insufficient documentation

## 2022-06-27 DIAGNOSIS — M797 Fibromyalgia: Secondary | ICD-10-CM | POA: Insufficient documentation

## 2022-06-27 DIAGNOSIS — F419 Anxiety disorder, unspecified: Secondary | ICD-10-CM | POA: Diagnosis not present

## 2022-06-27 HISTORY — PX: MALONEY DILATION: SHX5535

## 2022-06-27 HISTORY — PX: ESOPHAGOGASTRODUODENOSCOPY (EGD) WITH PROPOFOL: SHX5813

## 2022-06-27 LAB — GLUCOSE, CAPILLARY: Glucose-Capillary: 138 mg/dL — ABNORMAL HIGH (ref 70–99)

## 2022-06-27 SURGERY — ESOPHAGOGASTRODUODENOSCOPY (EGD) WITH PROPOFOL
Anesthesia: General

## 2022-06-27 MED ORDER — PROPOFOL 500 MG/50ML IV EMUL
INTRAVENOUS | Status: DC | PRN
  Administered 2022-06-27: 180 ug/kg/min via INTRAVENOUS

## 2022-06-27 MED ORDER — PROPOFOL 10 MG/ML IV BOLUS
INTRAVENOUS | Status: DC | PRN
  Administered 2022-06-27: 80 mg via INTRAVENOUS

## 2022-06-27 MED ORDER — LACTATED RINGERS IV SOLN: INTRAVENOUS | Status: DC

## 2022-06-27 NOTE — Anesthesia Preprocedure Evaluation (Addendum)
Anesthesia Evaluation  Patient identified by MRN, date of birth, ID band Patient awake    Reviewed: Allergy & Precautions, NPO status , Patient's Chart, lab work & pertinent test results  History of Anesthesia Complications (+) PONV and history of anesthetic complications  Airway Mallampati: II  TM Distance: >3 FB Neck ROM: Full    Dental  (+) Edentulous Upper, Edentulous Lower   Pulmonary shortness of breath and with exertion, former smoker,    Pulmonary exam normal breath sounds clear to auscultation       Cardiovascular hypertension, Pt. on medications + DOE  Normal cardiovascular exam+ Valvular Problems/Murmurs  Rhythm:Regular Rate:Normal     Neuro/Psych  Headaches, PSYCHIATRIC DISORDERS Anxiety Depression TIA Neuromuscular disease CVA    GI/Hepatic Neg liver ROS, GERD  Medicated,  Endo/Other  diabetes, Well Controlled, Type 2, Oral Hypoglycemic Agents  Renal/GU negative Renal ROS  negative genitourinary   Musculoskeletal  (+) Arthritis , Osteoarthritis,  Fibromyalgia -  Abdominal   Peds negative pediatric ROS (+)  Hematology negative hematology ROS (+)   Anesthesia Other Findings Vocal cord polyp removal Lumbar radiculopathy Chronic pain  Reproductive/Obstetrics negative OB ROS                           Anesthesia Physical Anesthesia Plan  ASA: 3  Anesthesia Plan: General   Post-op Pain Management: Minimal or no pain anticipated   Induction: Intravenous  PONV Risk Score and Plan: Propofol infusion  Airway Management Planned: Nasal Cannula and Natural Airway  Additional Equipment:   Intra-op Plan:   Post-operative Plan:   Informed Consent: I have reviewed the patients History and Physical, chart, labs and discussed the procedure including the risks, benefits and alternatives for the proposed anesthesia with the patient or authorized representative who has indicated  his/her understanding and acceptance.     Dental advisory given  Plan Discussed with: CRNA and Surgeon  Anesthesia Plan Comments:         Anesthesia Quick Evaluation

## 2022-06-27 NOTE — Transfer of Care (Signed)
Immediate Anesthesia Transfer of Care Note  Patient: [name]  Procedure(s) Performed: ESOPHAGOGASTRODUODENOSCOPY (EGD) WITH PROPOFOL MALONEY DILATION  Patient Location: PACU  Anesthesia Type:General  Level of Consciousness: awake, alert  and oriented  Airway & Oxygen Therapy: Patient Spontanous Breathing  Post-op Assessment: Report given to RN, Post -op Vital signs reviewed and stable, Patient moving all extremities X 4 and Patient able to stick tongue midline  Post vital signs: Reviewed  Last Vitals:  Vitals Value Taken Time  BP 104/56   Temp 36.7 C 06/27/22 1341  Pulse 86 06/27/22 1341  Resp 12 06/27/22 1341  SpO2 98 % 06/27/22 1341    Last Pain:  Vitals:   06/27/22 1341  TempSrc: Oral  PainSc: 0-No pain         Complications: No notable events documented.

## 2022-06-27 NOTE — H&P (Signed)
@LOGO @   Primary Care Physician:  , MD Primary Gastroenterologist:  Dr.   Pre-Procedure History & Physical: HPI:  [name] is a 56 y.o. female here for further evaluation of recurrent esophageal dysphagia.  Similar symptoms 2019 normal upper GI tract on EGD.  54 2020 Maloney dilator passed.  Abolished her dysphagia until recently.  Reflux is good.  Incidentally, started on Viberzi recently.  This has helped.  IBS-D considerably.  She does have occasional headache.  Past Medical History:  Diagnosis Date   Arthritis    BMI (body mass index) 20.0-29.9 2008 192 lbs   Cervicalgia    Claustrophobia    Complication of anesthesia    post op N/V   CVA (cerebral vascular accident) (HCC) 06/2020   Depression    Depressive disorder    Diabetes mellitus    type 2   Fibromyalgia    GERD (gastroesophageal reflux disease) 2002   202 lbs   Goiter    Hypercholesteremia    Hypertension    Insomnia    Irritable bowel syndrome 2008   diarrhea predominant MEDS TRIED: BENTYL, LEVBID   LBP (low back pain)    Migraine    PONV (postoperative nausea and vomiting)    PTSD (post-traumatic stress disorder)    RUQ abdominal pain 2004 HIDA NL CTA w/ IVC-FATTY LIVER, ANGIOMYOLIPOMA LEFT KIDNEY   2008 HIDA NL   TIA (transient ischemic attack) 06/2020    Past Surgical History:  Procedure Laterality Date   ANKLE SURGERY  RIGHT   BREAST REDUCTION SURGERY  2006   COLONOSCOPY  2008 RMR   PAN-COLONIC TICS, NL TI   COLONOSCOPY  2013   Dr. 2014: diverticulum in ascending colon, mild diverticulosis in sigmoid. Recommended 5 year surveillance   COLONOSCOPY WITH PROPOFOL N/A 05/12/2018   Dr. 05/14/2018: pancolonic diverticulosis, otherwise normal   ESOPHAGEAL DILATION N/A 04/06/2015   Procedure: ESOPHAGEAL DILATION;  Surgeon: 04/08/2015, MD;  Location: [facility];  Service: Gastroenterology;  Laterality: N/A;  300   ESOPHAGOGASTRODUODENOSCOPY N/A 04/06/2015   RMR: Normal EGD status  post Maloney dilation of teh esophagus as described above   ESOPHAGOGASTRODUODENOSCOPY (EGD) WITH PROPOFOL N/A 05/12/2018   Dr. 05/14/2018: normal esophagus s/p dilation, normal stomach, normal duodenum   LAPAROSCOPIC BILATERAL SALPINGO OOPHERECTOMY Bilateral 11/22/2015   Procedure: LAPAROSCOPIC BILATERAL SALPINGO OOPHORECTOMY;  Surgeon: 01/20/2016, MD;  Location: [facility];  Service: Gynecology;  Laterality: Bilateral;   LUMBAR DISC SURGERY  2005   x2   MALONEY DILATION N/A 05/12/2018   Procedure: 05/14/2018 DILATION;  Surgeon: Elease Hashimoto, MD;  Location: [facility];  Service: Endoscopy;  Laterality: N/A;   SHOULDER SURGERY  1997 RIGHT   TOTAL ABDOMINAL HYSTERECTOMY  2000 fibroids/DUB   UPPER GASTROINTESTINAL ENDOSCOPY  2008 RMR   [facility] o/w NL   vocal cord polyp removal  2013   Dr. 2014    Prior to Admission medications   Medication Sig Start Date End Date Taking? Authorizing Provider  albuterol (VENTOLIN HFA) 108 (90 Base) MCG/ACT inhaler Inhale 1 puff into the lungs every 6 (six) hours as needed for shortness of breath or wheezing. 04/27/22  Yes [provider]  amLODipine (NORVASC) 2.5 MG tablet Take 2.5 mg by mouth 2 (two) times daily as needed (bp of 140/90 or >). 08/11/20  Yes [provider]  atorvastatin (LIPITOR) 20 MG tablet Take 20 mg by mouth daily. 09/09/13  Yes [provider]  azelastine (ASTELIN)  0.1 % nasal spray Place 2 sprays into both nostrils 2 (two) times daily as needed for allergies or rhinitis. 02/17/22  Yes [provider]  busPIRone (BUSPAR) 15 MG tablet Take 15 mg by mouth 3 (three) times daily. 02/04/18  Yes [provider]  Cholecalciferol (VITAMIN D-3) 5000 units TABS Take 5,000 Units by mouth daily.   Yes [provider]  clopidogrel (PLAVIX) 75 MG tablet Take 75 mg by mouth daily. 08/09/20  Yes [provider]  COMBIVENT RESPIMAT 20-100 MCG/ACT AERS respimat Inhale 2 puffs into the lungs every 6  (six) hours as needed for wheezing or shortness of breath.  04/24/16  Yes [provider]  DEXILANT 60 MG capsule TAKE 1 CAPSULE BY MOUTH ONCE A DAY. 03/22/17  Yes Mahala Menghini, PA-C  dicyclomine (BENTYL) 10 MG capsule TAKE 1 CAPSULE(10 MG) BY MOUTH FOUR TIMES DAILY BEFORE MEALS AND AT BEDTIME 03/27/22  Yes Mahon, Courtney L, NP  DULoxetine (CYMBALTA) 60 MG capsule Take 1 capsule (60 mg total) by mouth daily. 02/04/17 05/18/22 Yes Cloria Spring, MD  fexofenadine (ALLEGRA) 180 MG tablet Take 180 mg by mouth daily.   Yes [provider]  fluticasone (FLONASE) 50 MCG/ACT nasal spray Place 2 sprays into both nostrils daily as needed for allergies.    Yes [provider]  furosemide (LASIX) 20 MG tablet Take 10 mg by mouth daily as needed for fluid.    Yes [provider]  hydrochlorothiazide (HYDRODIURIL) 25 MG tablet Take 25 mg by mouth daily. 05/01/22  Yes [provider]  lidocaine (LIDODERM) 5 % Place 1 patch onto the skin daily as needed (pain). Remove & Discard patch within 12 hours or as directed by MD   Yes [provider]  liraglutide (VICTOZA) 18 MG/3ML SOPN Inject 1.8 mg into the skin at bedtime.    Yes [provider]  metFORMIN (GLUCOPHAGE) 500 MG tablet Take 1,000 mg by mouth 2 (two) times daily with a meal.   Yes [provider]  metoprolol (LOPRESSOR) 100 MG tablet Take 100 mg by mouth daily.  03/23/11  Yes [provider]  Multiple Vitamin (MULTIVITAMIN WITH MINERALS) TABS tablet Take 1 tablet by mouth daily.   Yes [provider]  oxyCODONE-acetaminophen (PERCOCET) 10-325 MG tablet Take 1 tablet by mouth in the morning, at noon, in the evening, and at bedtime. 07/16/17  Yes [provider]  pioglitazone (ACTOS) 15 MG tablet Take 15 mg by mouth daily. 03/27/18  Yes [provider]  potassium chloride SA (KLOR-CON M) 20 MEQ tablet Take 1 tablet (20 mEq total) by mouth daily. 06/25/22  Yes  Antonina Deziel, Cristopher Estimable, MD  pregabalin (LYRICA) 50 MG capsule Take 50 mg by mouth 3 (three) times daily. 05/15/22  Yes [provider]  Probiotic Product ([place]) Take 1 capsule by mouth daily.   Yes [provider]  valsartan (DIOVAN) 320 MG tablet Take 320 mg by mouth daily. 03/30/22  Yes [provider]  VOLTAREN 1 % GEL Apply 2 g topically daily as needed (pain).  10/28/14  Yes [provider]  naloxone (NARCAN) nasal spray 4 mg/0.1 mL 0.4 mg once. 02/07/22   [provider]    Allergies as of 04/12/2022 - Review Complete 03/27/2022  Allergen Reaction Noted   Neurontin [gabapentin] Other (See Comments) 03/24/2013    Family History  Problem Relation Age of Onset   Anxiety disorder Mother    Stroke Mother  COPD Mother    Hypertension Mother    Thyroid disease Mother    Diabetes Mother    Seizures Sister    Anxiety disorder Sister    Stroke Sister    Breast cancer Sister    Anxiety disorder Sister    Drug abuse Sister    Early death Sister    Heart disease Sister    Heart attack Sister    Drug abuse Daughter    Anxiety disorder Daughter    Breast cancer Maternal Aunt    Anxiety disorder Maternal Aunt    Dementia Maternal Aunt    Colon cancer Maternal Aunt        in her 81s   Pancreatic cancer Maternal Aunt        great aunt   Alcohol abuse Maternal Uncle    Stroke Maternal Grandmother    Mental illness Cousin    Colon polyps Neg Hx    ADD / ADHD Neg Hx    Bipolar disorder Neg Hx    Depression Neg Hx    OCD Neg Hx    Paranoid behavior Neg Hx    Schizophrenia Neg Hx    Sexual abuse Neg Hx    Physical abuse Neg Hx     Social History   Socioeconomic History   Marital status: Single    Spouse name: Not on file   Number of children: 3   Years of education: 12th   Highest education level: Not on file  Occupational History    Employer: RETIRED    Comment: [employer]  Tobacco Use   Smoking status: Former     Packs/day: 0.50    Years: 2.00    Total pack years: 1.00    Types: Cigarettes    Quit date: 06/29/2020    Years since quitting: 1.9   Smokeless tobacco: Never  Vaping Use   Vaping Use: Never used  Substance and Sexual Activity   Alcohol use: No    Alcohol/week: 0.0 standard drinks of alcohol   Drug use: No   Sexual activity: Yes    Birth control/protection: Surgical  Other Topics Concern   Not on file  Social History Narrative   Patient lives at home with her family. Raising two grand daughters, daughter passed away 03-24-2016   Caffeine Use: 2 cups of coffee daily   Social Determinants of Health   Financial Resource Strain: Not on file  Food Insecurity: Not on file  Transportation Needs: Not on file  Physical Activity: Not on file  Stress: Not on file  Social Connections: Not on file  Intimate Partner Violence: Not on file    Review of Systems: See HPI, otherwise negative ROS  Physical Exam: BP (!) 151/85   Pulse (!) 105   Temp 98 F (36.7 C) (Oral)   Resp 16   SpO2 98%  General:   Alert,  Well-developed, well-nourished, pleasant and cooperative in NAD SNeck:  Supple; no masses or thyromegaly. No significant cervical adenopathy. Lungs:  Clear throughout to auscultation.   No wheezes, crackles, or rhonchi. No acute distress. Heart:  Regular rate and rhythm; no murmurs, clicks, rubs,  or gallops. Abdomen: Non-distended, normal bowel sounds.  Soft and nontender without appreciable mass or hepatosplenomegaly.  Pulses:  Normal pulses noted. Extremities:  Without clubbing or edema.  Impression/Plan: 56 year old lady with recurrent esophageal dysphagia.  Normal esophagus previously but responded nicely to empiric dilation.  Well-controlled on Dexilant 60 mg daily.  I have offered the patient  an EGD with esophageal dilation as feasible/appropriate per plan.  The risks, benefits, limitations, alternatives and imponderables have been reviewed with the patient. Potential  for esophageal dilation, biopsy, etc. have also been reviewed.  Questions have been answered. All parties agreeable.      Notice: This dictation was prepared with Dragon dictation along with smaller phrase technology. Any transcriptional errors that result from this process are unintentional and may not be corrected upon review.

## 2022-06-27 NOTE — Op Note (Signed)
[facility] Patient Name: [name] Procedure Date: 06/27/2022 1:10 PM MRN: [id number] Date of Birth: 07-06-1966 Attending MD: Norvel Richards , MD CSN: [id number] Age: 56 Admit Type: Outpatient Procedure:                Upper GI endoscopy Indications:              Dysphagia Providers:                Norvel Richards, MD, Rosina Lowenstein, RN, Ladoris Gene, Technician, Suzan Garibaldi. Risa Grill, Technician Referring MD:              Medicines:                Propofol per Anesthesia Complications:            No immediate complications. Estimated Blood Loss:     Estimated blood loss: none. Procedure:                Pre-Anesthesia Assessment:                           - Prior to the procedure, a History and Physical                            was performed, and patient medications and                            allergies were reviewed. The patient's tolerance of                            previous anesthesia was also reviewed. The risks                            and benefits of the procedure and the sedation                            options and risks were discussed with the patient.                            All questions were answered, and informed consent                            was obtained. Prior Anticoagulants: The patient has                            taken no previous anticoagulant or antiplatelet                            agents. ASA Grade Assessment: III - A patient with                            severe systemic disease. After reviewing the risks  and benefits, the patient was deemed in                            satisfactory condition to undergo the procedure.                           After obtaining informed consent, the endoscope was                            passed under direct vision. Throughout the                            procedure, the patient's blood pressure, pulse, and                             oxygen saturations were monitored continuously. The                            [id number]) scope was introduced through the                            mouth, and advanced to the second part of duodenum.                            The upper GI endoscopy was accomplished without                            difficulty. The patient tolerated the procedure                            well. The upper GI endoscopy was accomplished                            without difficulty. The patient tolerated the                            procedure well. Scope In: 1:31:21 PM Scope Out: 1:36:56 PM Total Procedure Duration: 0 hours 5 minutes 35 seconds  Findings:      A non-obstructing Schatzki ring was found at the gastroesophageal       junction.      A small hiatal hernia was present.      The duodenal bulb and second portion of the duodenum were normal. The       scope was withdrawn. Dilation was performed with a Maloney dilator with       no resistance at 102 Fr. The scope was withdrawn. Dilation was performed       with a Maloney dilator with mild resistance at 56 Fr. The dilation site       was examined following endoscope reinsertion and showed no change.       Estimated blood loss: none. Impression:               - Non-obstructing Schatzki ring. Dilated.                           -  Small hiatal hernia.                           - Normal duodenal bulb and second portion of the                            duodenum.                           - No specimens collected. Moderate Sedation:      Moderate (conscious) sedation was personally administered by an       anesthesia professional. The following parameters were monitored: oxygen       saturation, heart rate, blood pressure, respiratory rate, EKG, adequacy       of pulmonary ventilation, and response to care. Recommendation:           - Patient has a contact number available for                            emergencies. The signs and symptoms  of potential                            delayed complications were discussed with the                            patient. Return to normal activities tomorrow.                            Written discharge instructions were provided to the                            patient.                           - Resume previous diet.                           - Continue present medications.                           - Await pathology results.                           - Return to my office (date not yet determined). Procedure Code(s):        --- Professional ---                           [phone], Esophagogastroduodenoscopy, flexible,                            transoral; diagnostic, including collection of                            specimen(s) by brushing or washing, when performed                            (separate procedure)  43450, Dilation of esophagus, by unguided sound or                            bougie, single or multiple passes Diagnosis Code(s):        --- Professional ---                           K22.2, Esophageal obstruction                           K44.9, Diaphragmatic hernia without obstruction or                            gangrene                           R13.10, Dysphagia, unspecified CPT copyright 2019 [employer]. All rights reserved. The codes documented in this report are preliminary and upon coder review may  be revised to meet current compliance requirements. Gerrit Friends. Destinae Neubecker, MD Gennette Pac, MD 06/27/2022 1:50:28 PM This report has been signed electronically. Number of Addenda: 0

## 2022-06-27 NOTE — Discharge Instructions (Signed)
EGD Discharge instructions Please read the instructions outlined below and refer to this sheet in the next few weeks. These discharge instructions provide you with general information on caring for yourself after you leave the hospital. Your doctor may also give you specific instructions. While your treatment has been planned according to the most current medical practices available, unavoidable complications occasionally occur. If you have any problems or questions after discharge, please call your doctor. ACTIVITY You may resume your regular activity but move at a slower pace for the next 24 hours.  Take frequent rest periods for the next 24 hours.  Walking will help expel (get rid of) the air and reduce the bloated feeling in your abdomen.  No driving for 24 hours (because of the anesthesia (medicine) used during the test).  You may shower.  Do not sign any important legal documents or operate any machinery for 24 hours (because of the anesthesia used during the test).  NUTRITION Drink plenty of fluids.  You may resume your normal diet.  Begin with a light meal and progress to your normal diet.  Avoid alcoholic beverages for 24 hours or as instructed by your caregiver.  MEDICATIONS You may resume your normal medications unless your caregiver tells you otherwise.  WHAT YOU CAN EXPECT TODAY You may experience abdominal discomfort such as a feeling of fullness or "gas" pains.  FOLLOW-UP Your doctor will discuss the results of your test with you.  SEEK IMMEDIATE MEDICAL ATTENTION IF ANY OF THE FOLLOWING OCCUR: Excessive nausea (feeling sick to your stomach) and/or vomiting.  Severe abdominal pain and distention (swelling).  Trouble swallowing.  Temperature over 101 F (37.8 C).  Rectal bleeding or vomiting of blood.     Your esophagus was stretched today  Continue present medication regimen  Please keep your upcoming office appointment with [place].  At patient request, I called Leafy Ro at [phone] findings and recommendations

## 2022-06-27 NOTE — Anesthesia Postprocedure Evaluation (Signed)
Anesthesia Post Note  Patient: [name]  Procedure(s) Performed: ESOPHAGOGASTRODUODENOSCOPY (EGD) WITH PROPOFOL MALONEY DILATION  Patient location during evaluation: Phase II Anesthesia Type: General Level of consciousness: awake and alert and oriented Pain management: pain level controlled Vital Signs Assessment: post-procedure vital signs reviewed and stable Respiratory status: spontaneous breathing, nonlabored ventilation and respiratory function stable Cardiovascular status: blood pressure returned to baseline and stable Postop Assessment: no apparent nausea or vomiting Anesthetic complications: no   No notable events documented.   Last Vitals:  Vitals:   06/27/22 1205 06/27/22 1341  BP: (!) 151/85 (!) 95/42  Pulse: (!) 105 86  Resp: 16 12  Temp: 36.7 C 36.7 C  SpO2: 98% 98%    Last Pain:  Vitals:   06/27/22 1341  TempSrc: Oral  PainSc: 0-No pain                 [name]

## 2022-06-28 ENCOUNTER — Other Ambulatory Visit: Payer: Self-pay | Admitting: Internal Medicine

## 2022-06-28 DIAGNOSIS — Z1231 Encounter for screening mammogram for malignant neoplasm of breast: Secondary | ICD-10-CM

## 2022-07-02 ENCOUNTER — Encounter (HOSPITAL_COMMUNITY): Payer: Self-pay | Admitting: Internal Medicine

## 2022-07-03 DIAGNOSIS — Z8673 Personal history of transient ischemic attack (TIA), and cerebral infarction without residual deficits: Secondary | ICD-10-CM | POA: Diagnosis not present

## 2022-07-03 DIAGNOSIS — G459 Transient cerebral ischemic attack, unspecified: Secondary | ICD-10-CM | POA: Diagnosis not present

## 2022-07-03 DIAGNOSIS — R519 Headache, unspecified: Secondary | ICD-10-CM | POA: Diagnosis not present

## 2022-07-04 DIAGNOSIS — E119 Type 2 diabetes mellitus without complications: Secondary | ICD-10-CM | POA: Diagnosis not present

## 2022-07-04 DIAGNOSIS — Z299 Encounter for prophylactic measures, unspecified: Secondary | ICD-10-CM | POA: Diagnosis not present

## 2022-07-04 DIAGNOSIS — G43909 Migraine, unspecified, not intractable, without status migrainosus: Secondary | ICD-10-CM | POA: Diagnosis not present

## 2022-07-04 DIAGNOSIS — Z87891 Personal history of nicotine dependence: Secondary | ICD-10-CM | POA: Diagnosis not present

## 2022-07-04 DIAGNOSIS — E1165 Type 2 diabetes mellitus with hyperglycemia: Secondary | ICD-10-CM | POA: Diagnosis not present

## 2022-07-04 DIAGNOSIS — I10 Essential (primary) hypertension: Secondary | ICD-10-CM | POA: Diagnosis not present

## 2022-07-10 ENCOUNTER — Ambulatory Visit: Payer: Medicare Other | Admitting: Gastroenterology

## 2022-07-11 DIAGNOSIS — Z79891 Long term (current) use of opiate analgesic: Secondary | ICD-10-CM | POA: Diagnosis not present

## 2022-07-11 DIAGNOSIS — M25562 Pain in left knee: Secondary | ICD-10-CM | POA: Diagnosis not present

## 2022-07-18 DIAGNOSIS — I10 Essential (primary) hypertension: Secondary | ICD-10-CM | POA: Diagnosis not present

## 2022-07-31 ENCOUNTER — Other Ambulatory Visit: Payer: Self-pay | Admitting: Internal Medicine

## 2022-07-31 DIAGNOSIS — Z1231 Encounter for screening mammogram for malignant neoplasm of breast: Secondary | ICD-10-CM

## 2022-08-08 DIAGNOSIS — Z79891 Long term (current) use of opiate analgesic: Secondary | ICD-10-CM | POA: Diagnosis not present

## 2022-08-08 DIAGNOSIS — M25562 Pain in left knee: Secondary | ICD-10-CM | POA: Diagnosis not present

## 2022-08-08 NOTE — Progress Notes (Deleted)
GI Office Note    Referring Provider: Kirstie PeriShah, Ashish, MD Primary Care Physician:  Kirstie PeriShah, Ashish, MD Primary Gastroenterologist: Dr. Jena Gaussourk  Date:  08/08/2022  ID:  [name], DOB May 05, 1966, MRN [id number]   Chief Complaint   No chief complaint on file.   History of Present Illness  [name] is a 56 y.o. female with a history of arthritis, cervicalgia with chronic pain, CVA in August 2021, depression, diabetes, fibromyalgia, GERD, IBS-D, and fatty liver*** presenting today for follow up.  Last colonoscopy 05/12/18 - pancolonic diverticulosis, otherwise normal. Advised repeat in 5 years.  Last seen in the office 03/27/22: ***Reported 4 Bms per day with loose/watery stools after every meal. Previously taken bentyl without any relief. Also with mid abdomen or lower pain (stabbing/cramping/twisting) that improves with defecation. No nocturnal stools. Also reported intermittent black stools with fouls smells. Denied BRBPR. Also c/o dysphagia with saliva, pills, solids, and liquids. On dexilant daily. Advised to trial viberzi and continue 60 mg daily. Scheduled for EGD/ED. Insurance denied viberzi and required to trial loperamide. Patient assistance initiated and cover my meds denied, requiring to try Xifaxin first. Xifaxin sent in 05/03/22.  EGD 06/27/22: Non-obstructing schatzki ring s/p dilation, small hiatal hernia, normal duodenum  Today: Dysphagia -    Diarrhea -     Current Outpatient Medications  Medication Sig Dispense Refill   albuterol (VENTOLIN HFA) 108 (90 Base) MCG/ACT inhaler Inhale 1 puff into the lungs every 6 (six) hours as needed for shortness of breath or wheezing.     amLODipine (NORVASC) 2.5 MG tablet Take 2.5 mg by mouth 2 (two) times daily as needed (bp of 140/90 or >).     atorvastatin (LIPITOR) 20 MG tablet Take 20 mg by mouth daily.     azelastine (ASTELIN) 0.1 % nasal spray Place 2 sprays into both nostrils 2 (two) times daily as needed for allergies or  rhinitis.     busPIRone (BUSPAR) 15 MG tablet Take 15 mg by mouth 3 (three) times daily.  4   Cholecalciferol (VITAMIN D-3) 5000 units TABS Take 5,000 Units by mouth daily.     clopidogrel (PLAVIX) 75 MG tablet Take 75 mg by mouth daily.     COMBIVENT RESPIMAT 20-100 MCG/ACT AERS respimat Inhale 2 puffs into the lungs every 6 (six) hours as needed for wheezing or shortness of breath.      DEXILANT 60 MG capsule TAKE 1 CAPSULE BY MOUTH ONCE A DAY. 30 capsule 3   dicyclomine (BENTYL) 10 MG capsule TAKE 1 CAPSULE(10 MG) BY MOUTH FOUR TIMES DAILY BEFORE MEALS AND AT BEDTIME 120 capsule 1   DULoxetine (CYMBALTA) 60 MG capsule Take 1 capsule (60 mg total) by mouth daily. 30 capsule 0   fexofenadine (ALLEGRA) 180 MG tablet Take 180 mg by mouth daily.     fluticasone (FLONASE) 50 MCG/ACT nasal spray Place 2 sprays into both nostrils daily as needed for allergies.      furosemide (LASIX) 20 MG tablet Take 10 mg by mouth daily as needed for fluid.      hydrochlorothiazide (HYDRODIURIL) 25 MG tablet Take 25 mg by mouth daily.     lidocaine (LIDODERM) 5 % Place 1 patch onto the skin daily as needed (pain). Remove & Discard patch within 12 hours or as directed by MD     liraglutide (VICTOZA) 18 MG/3ML SOPN Inject 1.8 mg into the skin at bedtime.      metFORMIN (GLUCOPHAGE) 500 MG tablet Take 1,000 mg  by mouth 2 (two) times daily with a meal.     metoprolol (LOPRESSOR) 100 MG tablet Take 100 mg by mouth daily.      Multiple Vitamin (MULTIVITAMIN WITH MINERALS) TABS tablet Take 1 tablet by mouth daily.     naloxone (NARCAN) nasal spray 4 mg/0.1 mL 0.4 mg once.     oxyCODONE-acetaminophen (PERCOCET) 10-325 MG tablet Take 1 tablet by mouth in the morning, at noon, in the evening, and at bedtime.  0   pioglitazone (ACTOS) 15 MG tablet Take 15 mg by mouth daily.  2   potassium chloride SA (KLOR-CON M) 20 MEQ tablet Take 1 tablet (20 mEq total) by mouth daily. 5 tablet 0   pregabalin (LYRICA) 50 MG capsule Take 50  mg by mouth 3 (three) times daily.     Probiotic Product (PHILLIPS COLON HEALTH PO) Take 1 capsule by mouth daily.     valsartan (DIOVAN) 320 MG tablet Take 320 mg by mouth daily.     VOLTAREN 1 % GEL Apply 2 g topically daily as needed (pain).      No current facility-administered medications for this visit.    Past Medical History:  Diagnosis Date   Arthritis    BMI (body mass index) 20.0-29.9 2008 192 lbs   Cervicalgia    Claustrophobia    Complication of anesthesia    post op N/V   CVA (cerebral vascular accident) ([place]) 06/2020   Depression    Depressive disorder    Diabetes mellitus    type 2   Fibromyalgia    GERD (gastroesophageal reflux disease) 2002   202 lbs   Goiter    Hypercholesteremia    Hypertension    Insomnia    Irritable bowel syndrome 2008   diarrhea predominant MEDS TRIED: BENTYL, LEVBID   LBP (low back pain)    Migraine    PONV (postoperative nausea and vomiting)    PTSD (post-traumatic stress disorder)    RUQ abdominal pain 2004 HIDA NL CTA w/ IVC-FATTY LIVER, ANGIOMYOLIPOMA LEFT KIDNEY   2008 HIDA NL   TIA (transient ischemic attack) 06/2020    Past Surgical History:  Procedure Laterality Date   ANKLE SURGERY  RIGHT   BREAST REDUCTION SURGERY  2006   COLONOSCOPY  2008 RMR   PAN-COLONIC TICS, NL TI   COLONOSCOPY  2013   Dr. Britta Mccreedy: diverticulum in ascending colon, mild diverticulosis in sigmoid. Recommended 5 year surveillance   COLONOSCOPY WITH PROPOFOL N/A 05/12/2018   Dr. Gala Romney: pancolonic diverticulosis, otherwise normal   ESOPHAGEAL DILATION N/A 04/06/2015   Procedure: ESOPHAGEAL DILATION;  Surgeon: Daneil Dolin, MD;  [facility];  Service: Gastroenterology;  Laterality: N/A;  300   ESOPHAGOGASTRODUODENOSCOPY N/A 04/06/2015   RMR: Normal EGD status post Maloney dilation of teh esophagus as described above   ESOPHAGOGASTRODUODENOSCOPY (EGD) WITH PROPOFOL N/A 05/12/2018   Dr. Gala Romney: normal esophagus s/p dilation, normal stomach,  normal duodenum   ESOPHAGOGASTRODUODENOSCOPY (EGD) WITH PROPOFOL N/A 06/27/2022   Procedure: ESOPHAGOGASTRODUODENOSCOPY (EGD) WITH PROPOFOL;  Surgeon: Daneil Dolin, MD;  [facility];  Service: Endoscopy;  Laterality: N/A;  11:00am   LAPAROSCOPIC BILATERAL SALPINGO OOPHERECTOMY Bilateral 11/22/2015   Procedure: LAPAROSCOPIC BILATERAL SALPINGO OOPHORECTOMY;  Surgeon: Jonnie Kind, MD;  Location: [facility];  Service: Gynecology;  Laterality: Bilateral;   [place] SURGERY  2005   x2   MALONEY DILATION N/A 05/12/2018   Procedure: Venia Minks DILATION;  Surgeon: Daneil Dolin, MD;  [facility];  Service: Endoscopy;  Laterality: N/A;   MALONEY DILATION N/A 06/27/2022   Procedure: Elease Hashimoto DILATION;  Surgeon: Corbin Ade, MD;  [facility];  Service: Endoscopy;  Laterality: N/A;   SHOULDER SURGERY  1997 RIGHT   TOTAL ABDOMINAL HYSTERECTOMY  2000 fibroids/DUB   UPPER GASTROINTESTINAL ENDOSCOPY  2008 RMR   [facility] o/w NL   vocal cord polyp removal  2013   Dr. Josephina Shih    Family History  Problem Relation Age of Onset   Anxiety disorder Mother    Stroke Mother    COPD Mother    Hypertension Mother    Thyroid disease Mother    Diabetes Mother    Seizures Sister    Anxiety disorder Sister    Stroke Sister    Breast cancer Sister    Anxiety disorder Sister    Drug abuse Sister    Early death Sister    Heart disease Sister    Heart attack Sister    Drug abuse Daughter    Anxiety disorder Daughter    Breast cancer Maternal Aunt    Anxiety disorder Maternal Aunt    Dementia Maternal Aunt    Colon cancer Maternal Aunt        in her 42s   Pancreatic cancer Maternal Aunt        great aunt   Alcohol abuse Maternal Uncle    Stroke Maternal Grandmother    Mental illness Cousin    Colon polyps Neg Hx    ADD / ADHD Neg Hx    Bipolar disorder Neg Hx    Depression Neg Hx    OCD Neg Hx    Paranoid behavior Neg Hx    Schizophrenia Neg Hx    Sexual abuse  Neg Hx    Physical abuse Neg Hx     Allergies as of 08/09/2022 - Review Complete 06/27/2022  Allergen Reaction Noted   Neurontin [gabapentin] Other (See Comments) 03/24/2013    Social History   Socioeconomic History   Marital status: Single    Spouse name: Not on file   Number of children: 3   Years of education: 12th   Highest education level: Not on file  Occupational History    Employer: RETIRED    Comment: [employer]  Tobacco Use   Smoking status: Former    Packs/day: 0.50    Years: 2.00    Total pack years: 1.00    Types: Cigarettes    Quit date: 06/29/2020    Years since quitting: 2.1   Smokeless tobacco: Never  Vaping Use   Vaping Use: Never used  Substance and Sexual Activity   Alcohol use: No    Alcohol/week: 0.0 standard drinks of alcohol   Drug use: No   Sexual activity: Yes    Birth control/protection: Surgical  Other Topics Concern   Not on file  Social History Narrative   Patient lives at home with her family. Raising two grand daughters, daughter passed away 22-Mar-2016   Caffeine Use: 2 cups of coffee daily   Social Determinants of Health   Financial Resource Strain: Not on file  Food Insecurity: Not on file  Transportation Needs: Not on file  Physical Activity: Not on file  Stress: Not on file  Social Connections: Not on file     Review of Systems   Gen: Denies fever, chills, anorexia. Denies fatigue, weakness, weight loss.  CV: Denies chest pain, palpitations, syncope, peripheral edema, and claudication. Resp:  Denies dyspnea at rest, cough, wheezing, coughing up blood, and pleurisy. GI: See HPI Derm: Denies rash, itching, dry skin Psych: Denies depression, anxiety, memory loss, confusion. No homicidal or suicidal ideation.  Heme: Denies bruising, bleeding, and enlarged lymph nodes.   Physical Exam   There were no vitals taken for this visit.  General:   Alert and oriented. No distress noted. Pleasant and cooperative.  Head:   Normocephalic and atraumatic. Eyes:  Conjuctiva clear without scleral icterus. Mouth:  Oral mucosa pink and moist. Good dentition. No lesions. Lungs:  Clear to auscultation bilaterally. No wheezes, rales, or rhonchi. No distress.  Heart:  S1, S2 present without murmurs appreciated.  Abdomen:  +BS, soft, non-tender and non-distended. No rebound or guarding. No HSM or masses noted. Rectal: *** Msk:  Symmetrical without gross deformities. Normal posture. Extremities:  Without edema. Neurologic:  Alert and  oriented x4 Psych:  Alert and cooperative. Normal mood and affect.   Assessment  RHAELYN [name] is a 56 y.o. female with a history of arthritis, cervicalgia with chronic pain, CVA in August 2021, depression, diabetes, fibromyalgia, GERD, IBS-D, and fatty liver*** presenting today for follow up.  Dysphagia/GERD:   IBS-D:    PLAN   ***     Brooke Bonito, MSN, FNP-BC, AGACNP-BC [facility]

## 2022-08-09 ENCOUNTER — Ambulatory Visit: Payer: Medicare Other | Admitting: Gastroenterology

## 2022-08-17 DIAGNOSIS — I10 Essential (primary) hypertension: Secondary | ICD-10-CM | POA: Diagnosis not present

## 2022-08-22 ENCOUNTER — Other Ambulatory Visit: Payer: Self-pay | Admitting: Gastroenterology

## 2022-08-22 DIAGNOSIS — K580 Irritable bowel syndrome with diarrhea: Secondary | ICD-10-CM

## 2022-08-30 ENCOUNTER — Ambulatory Visit: Payer: Medicare Other | Admitting: Gastroenterology

## 2022-08-30 NOTE — Progress Notes (Deleted)
GI Office Note    Referring Provider: Monico Blitz, MD Primary Care Physician:  Monico Blitz, MD Primary Gastroenterologist: ***  Date:  53/97/6734  ID:  [name], DOB [birth year], MRN [id number]   Chief Complaint   No chief complaint on file.   History of Present Illness  [name] is a 56 y.o. female with a history of arthritis, cervicalgia with chronic pain, CVA in August 2021, depression, diabetes, fibromyalgia, GERD, IBS-D, and fatty liver*** presenting today for follow up.   Last colonoscopy 05/12/18 - pancolonic diverticulosis, otherwise normal. Advised repeat in 5 years.   Last seen in the office 03/27/22: Reported 4 Bms per day with loose/watery stools after every meal. Previously taken bentyl without any relief. Also with mid abdomen or lower pain (stabbing/cramping/twisting) that improves with defecation. No nocturnal stools. Also reported intermittent black stools with fouls smells. Denied BRBPR. Also c/o dysphagia with saliva, pills, solids, and liquids. On dexilant daily. Advised to trial viberzi and continue Dexilant 60 mg daily. Avoid lactose and use Bentyl as needed. Scheduled for EGD/ED.   Insurance denied viberzi and required to trial loperamide. Patient assistance initiated and cover my meds denied Viberzi , requiring to try Xifaxin first. Xifaxin sent in 05/03/22.   EGD 06/27/22: Non-obstructing schatzki ring s/p dilation, small hiatal hernia, normal duodenum. She noted at time of endoscopy that her diarrhea was better.     Today: Dysphagia -      Diarrhea -     Current Outpatient Medications  Medication Sig Dispense Refill   albuterol (VENTOLIN HFA) 108 (90 Base) MCG/ACT inhaler Inhale 1 puff into the lungs every 6 (six) hours as needed for shortness of breath or wheezing.     amLODipine (NORVASC) 2.5 MG tablet Take 2.5 mg by mouth 2 (two) times daily as needed (bp of 140/90 or >).     atorvastatin (LIPITOR) 20 MG tablet Take 20 mg by mouth daily.      azelastine (ASTELIN) 0.1 % nasal spray Place 2 sprays into both nostrils 2 (two) times daily as needed for allergies or rhinitis.     busPIRone (BUSPAR) 15 MG tablet Take 15 mg by mouth 3 (three) times daily.  4   Cholecalciferol (VITAMIN D-3) 5000 units TABS Take 5,000 Units by mouth daily.     clopidogrel (PLAVIX) 75 MG tablet Take 75 mg by mouth daily.     COMBIVENT RESPIMAT 20-100 MCG/ACT AERS respimat Inhale 2 puffs into the lungs every 6 (six) hours as needed for wheezing or shortness of breath.      DEXILANT 60 MG capsule TAKE 1 CAPSULE BY MOUTH ONCE A DAY. 30 capsule 3   dicyclomine (BENTYL) 10 MG capsule TAKE 1 CAPSULE(10 MG) BY MOUTH FOUR TIMES DAILY BEFORE MEALS AND AT BEDTIME 120 capsule 1   DULoxetine (CYMBALTA) 60 MG capsule Take 1 capsule (60 mg total) by mouth daily. 30 capsule 0   fexofenadine (ALLEGRA) 180 MG tablet Take 180 mg by mouth daily.     fluticasone (FLONASE) 50 MCG/ACT nasal spray Place 2 sprays into both nostrils daily as needed for allergies.      furosemide (LASIX) 20 MG tablet Take 10 mg by mouth daily as needed for fluid.      hydrochlorothiazide (HYDRODIURIL) 25 MG tablet Take 25 mg by mouth daily.     lidocaine (LIDODERM) 5 % Place 1 patch onto the skin daily as needed (pain). Remove & Discard patch within 12 hours or as directed by MD  liraglutide (VICTOZA) 18 MG/3ML SOPN Inject 1.8 mg into the skin at bedtime.      metFORMIN (GLUCOPHAGE) 500 MG tablet Take 1,000 mg by mouth 2 (two) times daily with a meal.     metoprolol (LOPRESSOR) 100 MG tablet Take 100 mg by mouth daily.      Multiple Vitamin (MULTIVITAMIN WITH MINERALS) TABS tablet Take 1 tablet by mouth daily.     naloxone (NARCAN) nasal spray 4 mg/0.1 mL 0.4 mg once.     oxyCODONE-acetaminophen (PERCOCET) 10-325 MG tablet Take 1 tablet by mouth in the morning, at noon, in the evening, and at bedtime.  0   pioglitazone (ACTOS) 15 MG tablet Take 15 mg by mouth daily.  2   potassium chloride SA  (KLOR-CON M) 20 MEQ tablet Take 1 tablet (20 mEq total) by mouth daily. 5 tablet 0   pregabalin (LYRICA) 50 MG capsule Take 50 mg by mouth 3 (three) times daily.     Probiotic Product (PHILLIPS COLON HEALTH PO) Take 1 capsule by mouth daily.     valsartan (DIOVAN) 320 MG tablet Take 320 mg by mouth daily.     VOLTAREN 1 % GEL Apply 2 g topically daily as needed (pain).      No current facility-administered medications for this visit.    Past Medical History:  Diagnosis Date   Arthritis    BMI (body mass index) 20.0-29.9 2008 192 lbs   Cervicalgia    Claustrophobia    Complication of anesthesia    post op N/V   CVA (cerebral vascular accident) ([facility]) 06/2020   Depression    Depressive disorder    Diabetes mellitus    type 2   Fibromyalgia    GERD (gastroesophageal reflux disease) 2002   202 lbs   Goiter    Hypercholesteremia    Hypertension    Insomnia    Irritable bowel syndrome 2008   diarrhea predominant MEDS TRIED: BENTYL, LEVBID   LBP (low back pain)    Migraine    PONV (postoperative nausea and vomiting)    PTSD (post-traumatic stress disorder)    RUQ abdominal pain 2004 HIDA NL CTA w/ IVC-FATTY LIVER, ANGIOMYOLIPOMA LEFT KIDNEY   2008 HIDA NL   TIA (transient ischemic attack) 06/2020    Past Surgical History:  Procedure Laterality Date   ANKLE SURGERY  RIGHT   BREAST REDUCTION SURGERY  2006   COLONOSCOPY  2008 RMR   PAN-COLONIC TICS, NL TI   COLONOSCOPY  2013   Dr. Teena Dunk: diverticulum in ascending colon, mild diverticulosis in sigmoid. Recommended 5 year surveillance   COLONOSCOPY WITH PROPOFOL N/A 05/12/2018   Dr. Jena Gauss: pancolonic diverticulosis, otherwise normal   ESOPHAGEAL DILATION N/A 04/06/2015   Procedure: ESOPHAGEAL DILATION;  Surgeon: Corbin Ade, MD;  [facility];  Service: Gastroenterology;  Laterality: N/A;  300   ESOPHAGOGASTRODUODENOSCOPY N/A 04/06/2015   RMR: Normal EGD status post Maloney dilation of teh esophagus as described  above   ESOPHAGOGASTRODUODENOSCOPY (EGD) WITH PROPOFOL N/A 05/12/2018   Dr. Jena Gauss: normal esophagus s/p dilation, normal stomach, normal duodenum   ESOPHAGOGASTRODUODENOSCOPY (EGD) WITH PROPOFOL N/A 06/27/2022   Procedure: ESOPHAGOGASTRODUODENOSCOPY (EGD) WITH PROPOFOL;  Surgeon: Corbin Ade, MD;  [facility];  Service: Endoscopy;  Laterality: N/A;  11:00am   LAPAROSCOPIC BILATERAL SALPINGO OOPHERECTOMY Bilateral 11/22/2015   Procedure: LAPAROSCOPIC BILATERAL SALPINGO OOPHORECTOMY;  Surgeon: Tilda Burrow, MD;  Location: [facility];  Service: Gynecology;  Laterality: Bilateral;   LUMBAR DISC SURGERY  2005   x2   MALONEY DILATION N/A 05/12/2018   Procedure: Elease Hashimoto DILATION;  Surgeon: Corbin Ade, MD;  [facility];  Service: Endoscopy;  Laterality: N/A;   MALONEY DILATION N/A 06/27/2022   Procedure: Elease Hashimoto DILATION;  Surgeon: Corbin Ade, MD;  [facility];  Service: Endoscopy;  Laterality: N/A;   SHOULDER SURGERY  1997 RIGHT   TOTAL ABDOMINAL HYSTERECTOMY  2000 fibroids/DUB   UPPER GASTROINTESTINAL ENDOSCOPY  2008 RMR   [facility] HH o/w NL   vocal cord polyp removal  2013   Dr. Josephina Shih    Family History  Problem Relation Age of Onset   Anxiety disorder Mother    Stroke Mother    COPD Mother    Hypertension Mother    Thyroid disease Mother    Diabetes Mother    Seizures Sister    Anxiety disorder Sister    Stroke Sister    Breast cancer Sister    Anxiety disorder Sister    Drug abuse Sister    Early death Sister    Heart disease Sister    Heart attack Sister    Drug abuse Daughter    Anxiety disorder Daughter    Breast cancer Maternal Aunt    Anxiety disorder Maternal Aunt    Dementia Maternal Aunt    Colon cancer Maternal Aunt        in her 3s   Pancreatic cancer Maternal Aunt        great aunt   Alcohol abuse Maternal Uncle    Stroke Maternal Grandmother    Mental illness Cousin    Colon polyps Neg Hx    ADD / ADHD Neg Hx     Bipolar disorder Neg Hx    Depression Neg Hx    OCD Neg Hx    Paranoid behavior Neg Hx    Schizophrenia Neg Hx    Sexual abuse Neg Hx    Physical abuse Neg Hx     Allergies as of 08/30/2022 - Review Complete 06/27/2022  Allergen Reaction Noted   Neurontin [gabapentin] Other (See Comments) 03/24/2013    Social History   Socioeconomic History   Marital status: Single    Spouse name: Not on file   Number of children: 3   Years of education: 12th   Highest education level: Not on file  Occupational History    Employer: RETIRED    Comment: [employer]  Tobacco Use   Smoking status: Former    Packs/day: 0.50    Years: 2.00    Total pack years: 1.00    Types: Cigarettes    Quit date: 06/29/2020    Years since quitting: 2.1   Smokeless tobacco: Never  Vaping Use   Vaping Use: Never used  Substance and Sexual Activity   Alcohol use: No    Alcohol/week: 0.0 standard drinks of alcohol   Drug use: No   Sexual activity: Yes    Birth control/protection: Surgical  Other Topics Concern   Not on file  Social History Narrative   Patient lives at home with her family. Raising two grand daughters, daughter passed away 04-16-2016   Caffeine Use: 2 cups of coffee daily   Social Determinants of Health   Financial Resource Strain: Not on file  Food Insecurity: Not on file  Transportation Needs: Not on file  Physical Activity: Not on file  Stress: Not on file  Social Connections: Not on file     Review of  Systems   Gen: Denies fever, chills, anorexia. Denies fatigue, weakness, weight loss.  CV: Denies chest pain, palpitations, syncope, peripheral edema, and claudication. Resp: Denies dyspnea at rest, cough, wheezing, coughing up blood, and pleurisy. GI: See HPI Derm: Denies rash, itching, dry skin Psych: Denies depression, anxiety, memory loss, confusion. No homicidal or suicidal ideation.  Heme: Denies bruising, bleeding, and enlarged lymph nodes.   Physical Exam   There  were no vitals taken for this visit.  General:   Alert and oriented. No distress noted. Pleasant and cooperative.  Head:  Normocephalic and atraumatic. Eyes:  Conjuctiva clear without scleral icterus. Mouth:  Oral mucosa pink and moist. Good dentition. No lesions. Lungs:  Clear to auscultation bilaterally. No wheezes, rales, or rhonchi. No distress.  Heart:  S1, S2 present without murmurs appreciated.  Abdomen:  +BS, soft, non-tender and non-distended. No rebound or guarding. No HSM or masses noted. Rectal: *** Msk:  Symmetrical without gross deformities. Normal posture. Extremities:  Without edema. Neurologic:  Alert and  oriented x4 Psych:  Alert and cooperative. Normal mood and affect.   Assessment  [name] is a 56 y.o. female with a history of arthritis, cervicalgia with chronic pain, CVA in August 2021, depression, diabetes, fibromyalgia, GERD, IBS-D, and fatty liver*** presenting today for follow up.   Dysphagia/GERD:    IBS-D:    PLAN   ***     Brooke Bonito, MSN, FNP-BC, AGACNP-BC [facility]

## 2022-08-31 ENCOUNTER — Other Ambulatory Visit: Payer: Self-pay | Admitting: Gastroenterology

## 2022-08-31 DIAGNOSIS — K580 Irritable bowel syndrome with diarrhea: Secondary | ICD-10-CM

## 2022-09-03 ENCOUNTER — Other Ambulatory Visit: Payer: Self-pay | Admitting: Gastroenterology

## 2022-09-03 DIAGNOSIS — K580 Irritable bowel syndrome with diarrhea: Secondary | ICD-10-CM

## 2022-09-05 DIAGNOSIS — Z79891 Long term (current) use of opiate analgesic: Secondary | ICD-10-CM | POA: Diagnosis not present

## 2022-09-05 DIAGNOSIS — M25562 Pain in left knee: Secondary | ICD-10-CM | POA: Diagnosis not present

## 2022-09-05 DIAGNOSIS — G894 Chronic pain syndrome: Secondary | ICD-10-CM | POA: Diagnosis not present

## 2022-09-11 NOTE — Progress Notes (Unsigned)
Primary Care Physician:  Monico Blitz, MD  Primary Gastroenterologist: Cristopher Estimable. Rourk, MD  Patient Location: Home Reason for Visit: Follow-up  Persons present on the virtual encounter, with roles: Patient - [name]; Provider - Venetia Night, NP   Total time (minutes) spent on medical discussion: 11 minutes  Virtual Visit Encounter Note Visit is conducted virtually and was requested by patient.   I connected with [name] on 24/40/10 at 10:30 AM EDT by video and verified that I am speaking with the correct person using two identifiers.   I discussed the limitations, risks, security and privacy concerns of performing an evaluation and management service by video and the availability of in person appointments. I also discussed with the patient that there may be a patient responsible charge related to this service. The patient expressed understanding and agreed to proceed.  Chief Complaint  Patient presents with   Follow-up    Follow up IBS-D     History of Present Illness: [name] is a 56 y.o. female with a history of arthritis, cervicalgia with chronic pain, CVA in August 2021, depression, diabetes, fibromyalgia, GERD, IBS-D, and fatty liver presenting today for follow up.  Last seen in the office 03/27/22: Reported 4 Bms per day with loose/watery stools after every meal. Previously taken bentyl without any relief. Also with mid abdomen or lower pain (stabbing/cramping/twisting) that improves with defecation. No nocturnal stools. Also reported intermittent black stools with fouls smells. Denied BRBPR. Also c/o dysphagia with saliva, pills, solids, and liquids. On dexilant daily. Advised to trial viberzi and continue Dexilant 60 mg daily. Avoid lactose and use Bentyl as needed. Scheduled for EGD/ED.    Insurance denied viberzi and required to trial loperamide. Patient assistance initiated and cover my meds denied Viberzi , requiring to try Xifaxin first. Xifaxin sent  in 05/03/22.   EGD 06/27/22: Non-obstructing schatzki ring s/p dilation, small hiatal hernia, normal duodenum. She noted at time of endoscopy that her diarrhea was better.    Today: Dysphagia - Denies any issues at all.   GERD: Has some occasional heartburn. Denies any dietary changes. Taking dexilant daily. Has not taken anything over the counter. Has only happened 4 or 5 times in the evenings. May eat dinner late about 8pm but does not go to bed until 2 or 3 in the morning. Reports she has bad insomnia.   Diarrhea - Took Viberzi for 1 month and then took [place] and then took viberzi again. Does get a headache from it but it does help with the diarrhea. Denies any abdominal pain, nausea or vomiting. While on Viberzi she feels like a normal person, not having loose stools everyday. Having more formed stools and only once per day. Stools are soft.  Denies any melena or BRBPR.  No lack of appetite, early satiety, or weight loss.    Medications Current Meds  Medication Sig   albuterol (VENTOLIN HFA) 108 (90 Base) MCG/ACT inhaler Inhale 1 puff into the lungs every 6 (six) hours as needed for shortness of breath or wheezing.   amLODipine (NORVASC) 2.5 MG tablet Take 2.5 mg by mouth 2 (two) times daily as needed (bp of 140/90 or >).   atorvastatin (LIPITOR) 20 MG tablet Take 20 mg by mouth daily.   azelastine (ASTELIN) 0.1 % nasal spray Place 2 sprays into both nostrils 2 (two) times daily as needed for allergies or rhinitis.   busPIRone (BUSPAR) 15 MG tablet Take 15 mg by mouth  3 (three) times daily.   Cholecalciferol (VITAMIN D-3) 5000 units TABS Take 5,000 Units by mouth daily.   clopidogrel (PLAVIX) 75 MG tablet Take 75 mg by mouth daily.   COMBIVENT RESPIMAT 20-100 MCG/ACT AERS respimat Inhale 2 puffs into the lungs every 6 (six) hours as needed for wheezing or shortness of breath.    DEXILANT 60 MG capsule TAKE 1 CAPSULE BY MOUTH ONCE A DAY.   dicyclomine (BENTYL) 10 MG capsule TAKE 1  CAPSULE(10 MG) BY MOUTH FOUR TIMES DAILY BEFORE MEALS AND AT BEDTIME   fexofenadine (ALLEGRA) 180 MG tablet Take 180 mg by mouth daily.   fluticasone (FLONASE) 50 MCG/ACT nasal spray Place 2 sprays into both nostrils daily as needed for allergies.    furosemide (LASIX) 20 MG tablet Take 10 mg by mouth daily as needed for fluid.    hydrochlorothiazide (HYDRODIURIL) 25 MG tablet Take 25 mg by mouth daily.   lidocaine (LIDODERM) 5 % Place 1 patch onto the skin daily as needed (pain). Remove & Discard patch within 12 hours or as directed by MD   liraglutide (VICTOZA) 18 MG/3ML SOPN Inject 1.8 mg into the skin at bedtime.    metFORMIN (GLUCOPHAGE) 500 MG tablet Take 1,000 mg by mouth 2 (two) times daily with a meal.   metoprolol (LOPRESSOR) 100 MG tablet Take 100 mg by mouth daily.    Multiple Vitamin (MULTIVITAMIN WITH MINERALS) TABS tablet Take 1 tablet by mouth daily.   naloxone (NARCAN) nasal spray 4 mg/0.1 mL 0.4 mg once.   oxyCODONE-acetaminophen (PERCOCET) 10-325 MG tablet Take 1 tablet by mouth in the morning, at noon, in the evening, and at bedtime.   pioglitazone (ACTOS) 15 MG tablet Take 15 mg by mouth daily.   pregabalin (LYRICA) 50 MG capsule Take 50 mg by mouth 3 (three) times daily.   Probiotic Product (PHILLIPS COLON HEALTH PO) Take 1 capsule by mouth daily.   valsartan (DIOVAN) 320 MG tablet Take 320 mg by mouth daily.   VOLTAREN 1 % GEL Apply 2 g topically daily as needed (pain).      History Past Medical History:  Diagnosis Date   Arthritis    BMI (body mass index) 20.0-29.9 2008 192 lbs   Cervicalgia    Claustrophobia    Complication of anesthesia    post op N/V   CVA (cerebral vascular accident) (HCC) 06/2020   Depression    Depressive disorder    Diabetes mellitus    type 2   Fibromyalgia    GERD (gastroesophageal reflux disease) 2002   202 lbs   Goiter    Hypercholesteremia    Hypertension    Insomnia    Irritable bowel syndrome 2008   diarrhea predominant  MEDS TRIED: BENTYL, LEVBID   LBP (low back pain)    Migraine    PONV (postoperative nausea and vomiting)    PTSD (post-traumatic stress disorder)    RUQ abdominal pain 2004 HIDA NL CTA w/ IVC-FATTY LIVER, ANGIOMYOLIPOMA LEFT KIDNEY   2008 HIDA NL   TIA (transient ischemic attack) 06/2020    Past Surgical History:  Procedure Laterality Date   ANKLE SURGERY  RIGHT   BREAST REDUCTION SURGERY  2006   COLONOSCOPY  2008 RMR   PAN-COLONIC TICS, NL TI   COLONOSCOPY  2013   Dr. Teena Dunk: diverticulum in ascending colon, mild diverticulosis in sigmoid. Recommended 5 year surveillance   COLONOSCOPY WITH PROPOFOL N/A 05/12/2018   Dr. Jena Gauss: pancolonic diverticulosis, otherwise normal   ESOPHAGEAL DILATION  N/A 04/06/2015   Procedure: ESOPHAGEAL DILATION;  Surgeon: Corbin Ade, MD;  [facility];  Service: Gastroenterology;  Laterality: N/A;  300   ESOPHAGOGASTRODUODENOSCOPY N/A 04/06/2015   RMR: Normal EGD status post Maloney dilation of teh esophagus as described above   ESOPHAGOGASTRODUODENOSCOPY (EGD) WITH PROPOFOL N/A 05/12/2018   Dr. Jena Gauss: normal esophagus s/p dilation, normal stomach, normal duodenum   ESOPHAGOGASTRODUODENOSCOPY (EGD) WITH PROPOFOL N/A 06/27/2022   Procedure: ESOPHAGOGASTRODUODENOSCOPY (EGD) WITH PROPOFOL;  Surgeon: Corbin Ade, MD;  Location: [facility];  Service: Endoscopy;  Laterality: N/A;  11:00am   LAPAROSCOPIC BILATERAL SALPINGO OOPHERECTOMY Bilateral 11/22/2015   Procedure: LAPAROSCOPIC BILATERAL SALPINGO OOPHORECTOMY;  Surgeon: Tilda Burrow, MD;  Location: [facility];  Service: Gynecology;  Laterality: Bilateral;   LUMBAR DISC SURGERY  2005   x2   MALONEY DILATION N/A 05/12/2018   Procedure: Elease Hashimoto DILATION;  Surgeon: Corbin Ade, MD;  Location: [facility];  Service: Endoscopy;  Laterality: N/A;   MALONEY DILATION N/A 06/27/2022   Procedure: Elease Hashimoto DILATION;  Surgeon: Corbin Ade, MD;  Location: [facility];  Service: Endoscopy;   Laterality: N/A;   SHOULDER SURGERY  1997 RIGHT   TOTAL ABDOMINAL HYSTERECTOMY  2000 fibroids/DUB   UPPER GASTROINTESTINAL ENDOSCOPY  2008 RMR   [facility] HH o/w NL   vocal cord polyp removal  2013   Dr. Josephina Shih    Family History  Problem Relation Age of Onset   Anxiety disorder Mother    Stroke Mother    COPD Mother    Hypertension Mother    Thyroid disease Mother    Diabetes Mother    Seizures Sister    Anxiety disorder Sister    Stroke Sister    Breast cancer Sister    Anxiety disorder Sister    Drug abuse Sister    Early death Sister    Heart disease Sister    Heart attack Sister    Drug abuse Daughter    Anxiety disorder Daughter    Breast cancer Maternal Aunt    Anxiety disorder Maternal Aunt    Dementia Maternal Aunt    Colon cancer Maternal Aunt        in her 67s   Pancreatic cancer Maternal Aunt        great aunt   Alcohol abuse Maternal Uncle    Stroke Maternal Grandmother    Mental illness Cousin    Colon polyps Neg Hx    ADD / ADHD Neg Hx    Bipolar disorder Neg Hx    Depression Neg Hx    OCD Neg Hx    Paranoid behavior Neg Hx    Schizophrenia Neg Hx    Sexual abuse Neg Hx    Physical abuse Neg Hx     Social History   Socioeconomic History   Marital status: Single    Spouse name: Not on file   Number of children: 3   Years of education: 12th   Highest education level: Not on file  Occupational History    Employer: RETIRED    Comment: [employer]  Tobacco Use   Smoking status: Former    Packs/day: 0.50    Years: 2.00    Total pack years: 1.00    Types: Cigarettes    Quit date: 06/29/2020    Years since quitting: 2.2   Smokeless tobacco: Never  Vaping Use   Vaping Use: Never used  Substance and Sexual Activity   Alcohol use:  No    Alcohol/week: 0.0 standard drinks of alcohol   Drug use: No   Sexual activity: Yes    Birth control/protection: Surgical  Other Topics Concern   Not on file  Social History Narrative   Patient lives at  home with her family. Raising two grand daughters, daughter passed away 04/19/16   Caffeine Use: 2 cups of coffee daily   Social Determinants of Health   Financial Resource Strain: Not on file  Food Insecurity: Not on file  Transportation Needs: Not on file  Physical Activity: Not on file  Stress: Not on file  Social Connections: Not on file      Review of Systems: Gen: Denies fever, chills, anorexia. Denies fatigue, weakness, weight loss.  CV: Denies chest pain, palpitations, syncope, peripheral edema, and claudication. Resp: Denies dyspnea at rest, cough, wheezing, coughing up blood, and pleurisy. GI: see HPI Derm: Denies rash, itching, dry skin Psych: Denies depression, anxiety, memory loss, confusion. No homicidal or suicidal ideation.  Heme: Denies bruising, bleeding, and enlarged lymph nodes.  Observations/Objective: No distress. Alert and oriented. Pleasant. Well nourished. Normal mood and affect. Unable to perform complete physical exam due to video encounter.   Assessment:  IBS-D: Last visit she was having about 4 loose/watery bowel movements per day, usually after every meal.  Previously failed Bentyl.  Patient denies symptoms of Viberzi and had good relief.  Unfortunately her insurance denied Viberzi.  Initiated coverage through cover my meds which required her to try Xifaxan prior to use of Viberzi.  She was given trial of Xifaxan and reported symptoms continued.  Her refill on Viberzi was not approved and she has been taking it.  She states she has a normal personal Viberzi.  She is having daily semiformed soft stools.  May have an occasional looser stool but it is not on a regular basis.  She does report needing a refill.  She does report a mild headache related to the medication but would rather continue taking it rather than dealing with diarrhea. Denies any abdominal pain, melena, or BRBPR.  Dysphagia/GERD: EGD 06/27/2022 with nonobstructing Schatzki's ring s/p dilation,  small hiatal hernia, normal duodenum.  Dysphagia symptoms have completely resolved.  She is on Dexilant 60 mg daily and has been having some occasional heartburn.  Denies any dietary changes.  Reports only 4-5 episodes of this.  Not currently taking anything over-the-counter.  She does eat late at night to go to bed within 2 or 3 hours of eating.   Denies any nausea, vomiting, or lack of appetite. Reinforced GERD diet/lifestyle modifications.  Advised that she may take Tums as needed for any breakthrough symptoms.  If her symptoms become more frequent she may try famotidine 20 mg nightly.  Overall doing well.  Plan:  Continue Dexilant 60 mg daily. May use Tums for breakthrough. If breakthrough symptoms become more frequent, she may take famotidine 20 mg nightly GERD diet/lifestyle modifications Viberzi 75 mg twice daily, refilled today. Follow-up in 6 months, or sooner if needed.    Follow Up Instructions:  I discussed the assessment and treatment plan with the patient. The patient was provided an opportunity to ask questions and all were answered. The patient agreed with the plan and demonstrated an understanding of the instructions.   The patient was advised to call back or seek an in-person evaluation if the symptoms worsen or if the condition fails to improve as anticipated.    Brooke Bonito, MSN, APRN, FNP-BC, AGACNP-BC [facility]

## 2022-09-12 ENCOUNTER — Telehealth (INDEPENDENT_AMBULATORY_CARE_PROVIDER_SITE_OTHER): Payer: Medicare Other | Admitting: Gastroenterology

## 2022-09-12 ENCOUNTER — Telehealth: Payer: Self-pay

## 2022-09-12 ENCOUNTER — Encounter: Payer: Self-pay | Admitting: Gastroenterology

## 2022-09-12 VITALS — Ht 71.0 in | Wt 225.0 lb

## 2022-09-12 DIAGNOSIS — K580 Irritable bowel syndrome with diarrhea: Secondary | ICD-10-CM | POA: Diagnosis not present

## 2022-09-12 DIAGNOSIS — K219 Gastro-esophageal reflux disease without esophagitis: Secondary | ICD-10-CM | POA: Diagnosis not present

## 2022-09-12 DIAGNOSIS — R1319 Other dysphagia: Secondary | ICD-10-CM | POA: Diagnosis not present

## 2022-09-12 MED ORDER — VIBERZI 75 MG PO TABS: 75.0000 mg | ORAL_TABLET | Freq: Two times a day (BID) | ORAL | 4 refills | Status: DC

## 2022-09-12 MED ORDER — VIBERZI 75 MG PO TABS: 75.0000 mg | ORAL_TABLET | Freq: Every day | ORAL | 4 refills | Status: DC

## 2022-09-12 MED ORDER — DEXLANSOPRAZOLE 60 MG PO CPDR: 1.0000 | DELAYED_RELEASE_CAPSULE | Freq: Every day | ORAL | 3 refills | Status: DC

## 2022-09-12 NOTE — Patient Instructions (Addendum)
I want you to continue taking Viberzi 75 mg twice daily.  I have sent a refill to your pharmacy today.  Continue taking your Dexilant 60 mg daily.  You may take Tums as needed for breakthrough symptoms.  If you find yourself having more than 3 days of breakthrough symptoms per week please contact the office.  At that point you may begin taking famotidine 20 mg nightly given that your symptoms are primarily in the evenings.  Follow a GERD diet:  Avoid fried, fatty, greasy, spicy, citrus foods. Avoid caffeine and carbonated beverages. Avoid chocolate. Try eating 4-6 small meals a day rather than 3 large meals. Do not eat within 3 hours of laying down. Prop head of bed up on wood or bricks to create a 6 inch incline.  Follow-up in 6 months or sooner if needed.  It was a pleasure to see you today. I want to create trusting relationships with patients. If you receive a survey regarding your visit,  I greatly appreciate you taking time to fill this out on paper or through your MyChart. I value your feedback.  Venetia Night, MSN, FNP-BC, AGACNP-BC [facility]

## 2022-09-12 NOTE — Telephone Encounter (Signed)
.  RGA 

## 2022-09-13 ENCOUNTER — Ambulatory Visit
Admission: RE | Admit: 2022-09-13 | Discharge: 2022-09-13 | Disposition: A | Discharge status: Home / Self Care | Payer: Medicare Other | Source: Ambulatory Visit | Attending: Internal Medicine | Admitting: Internal Medicine

## 2022-09-13 DIAGNOSIS — Z1231 Encounter for screening mammogram for malignant neoplasm of breast: Secondary | ICD-10-CM | POA: Diagnosis not present

## 2022-09-17 DIAGNOSIS — I10 Essential (primary) hypertension: Secondary | ICD-10-CM | POA: Diagnosis not present

## 2022-10-02 DIAGNOSIS — Z79891 Long term (current) use of opiate analgesic: Secondary | ICD-10-CM | POA: Diagnosis not present

## 2022-10-02 DIAGNOSIS — G894 Chronic pain syndrome: Secondary | ICD-10-CM | POA: Diagnosis not present

## 2022-10-02 DIAGNOSIS — M1119 Familial chondrocalcinosis, multiple sites: Secondary | ICD-10-CM | POA: Diagnosis not present

## 2022-10-02 DIAGNOSIS — M25562 Pain in left knee: Secondary | ICD-10-CM | POA: Diagnosis not present

## 2022-10-05 DIAGNOSIS — I10 Essential (primary) hypertension: Secondary | ICD-10-CM | POA: Diagnosis not present

## 2022-10-05 DIAGNOSIS — Z794 Long term (current) use of insulin: Secondary | ICD-10-CM | POA: Diagnosis not present

## 2022-10-05 DIAGNOSIS — E1165 Type 2 diabetes mellitus with hyperglycemia: Secondary | ICD-10-CM | POA: Diagnosis not present

## 2022-10-05 DIAGNOSIS — Z299 Encounter for prophylactic measures, unspecified: Secondary | ICD-10-CM | POA: Diagnosis not present

## 2022-10-05 DIAGNOSIS — H6692 Otitis media, unspecified, left ear: Secondary | ICD-10-CM | POA: Diagnosis not present

## 2022-10-17 DIAGNOSIS — I10 Essential (primary) hypertension: Secondary | ICD-10-CM | POA: Diagnosis not present

## 2022-11-01 ENCOUNTER — Other Ambulatory Visit: Payer: Self-pay | Admitting: Gastroenterology

## 2022-11-01 DIAGNOSIS — K580 Irritable bowel syndrome with diarrhea: Secondary | ICD-10-CM

## 2022-11-13 DIAGNOSIS — Z79891 Long term (current) use of opiate analgesic: Secondary | ICD-10-CM | POA: Diagnosis not present

## 2022-11-13 DIAGNOSIS — M25562 Pain in left knee: Secondary | ICD-10-CM | POA: Diagnosis not present

## 2022-11-13 DIAGNOSIS — G894 Chronic pain syndrome: Secondary | ICD-10-CM | POA: Diagnosis not present

## 2022-11-16 DIAGNOSIS — I10 Essential (primary) hypertension: Secondary | ICD-10-CM | POA: Diagnosis not present

## 2022-12-03 ENCOUNTER — Telehealth: Payer: Self-pay | Admitting: *Deleted

## 2022-12-03 NOTE — Telephone Encounter (Signed)
Received approval letter for Viberzi 75mg . Copy sent to scan center.

## 2022-12-12 DIAGNOSIS — M25562 Pain in left knee: Secondary | ICD-10-CM | POA: Diagnosis not present

## 2022-12-12 DIAGNOSIS — M25561 Pain in right knee: Secondary | ICD-10-CM | POA: Diagnosis not present

## 2022-12-12 DIAGNOSIS — Z79891 Long term (current) use of opiate analgesic: Secondary | ICD-10-CM | POA: Diagnosis not present

## 2022-12-12 DIAGNOSIS — M961 Postlaminectomy syndrome, not elsewhere classified: Secondary | ICD-10-CM | POA: Diagnosis not present

## 2022-12-12 DIAGNOSIS — M4727 Other spondylosis with radiculopathy, lumbosacral region: Secondary | ICD-10-CM | POA: Diagnosis not present

## 2022-12-12 DIAGNOSIS — G894 Chronic pain syndrome: Secondary | ICD-10-CM | POA: Diagnosis not present

## 2022-12-14 ENCOUNTER — Other Ambulatory Visit: Payer: Self-pay | Admitting: Gastroenterology

## 2022-12-14 DIAGNOSIS — K219 Gastro-esophageal reflux disease without esophagitis: Secondary | ICD-10-CM

## 2022-12-19 DIAGNOSIS — M170 Bilateral primary osteoarthritis of knee: Secondary | ICD-10-CM | POA: Diagnosis not present

## 2023-01-09 DIAGNOSIS — Z8679 Personal history of other diseases of the circulatory system: Secondary | ICD-10-CM | POA: Diagnosis not present

## 2023-01-09 DIAGNOSIS — I10 Essential (primary) hypertension: Secondary | ICD-10-CM | POA: Diagnosis not present

## 2023-01-09 DIAGNOSIS — Z299 Encounter for prophylactic measures, unspecified: Secondary | ICD-10-CM | POA: Diagnosis not present

## 2023-01-09 DIAGNOSIS — E1165 Type 2 diabetes mellitus with hyperglycemia: Secondary | ICD-10-CM | POA: Diagnosis not present

## 2023-01-09 DIAGNOSIS — K219 Gastro-esophageal reflux disease without esophagitis: Secondary | ICD-10-CM | POA: Diagnosis not present

## 2023-01-13 ENCOUNTER — Other Ambulatory Visit: Payer: Self-pay | Admitting: Gastroenterology

## 2023-01-13 DIAGNOSIS — K580 Irritable bowel syndrome with diarrhea: Secondary | ICD-10-CM

## 2023-01-14 ENCOUNTER — Other Ambulatory Visit: Payer: Self-pay | Admitting: Gastroenterology

## 2023-01-14 DIAGNOSIS — K580 Irritable bowel syndrome with diarrhea: Secondary | ICD-10-CM

## 2023-01-15 DIAGNOSIS — G894 Chronic pain syndrome: Secondary | ICD-10-CM | POA: Diagnosis not present

## 2023-01-15 DIAGNOSIS — Z79891 Long term (current) use of opiate analgesic: Secondary | ICD-10-CM | POA: Diagnosis not present

## 2023-01-15 DIAGNOSIS — M47817 Spondylosis without myelopathy or radiculopathy, lumbosacral region: Secondary | ICD-10-CM | POA: Diagnosis not present

## 2023-01-17 ENCOUNTER — Encounter: Payer: Self-pay | Admitting: Radiology

## 2023-01-21 ENCOUNTER — Encounter: Payer: Self-pay | Admitting: Gastroenterology

## 2023-03-19 DIAGNOSIS — Z79891 Long term (current) use of opiate analgesic: Secondary | ICD-10-CM | POA: Diagnosis not present

## 2023-03-19 DIAGNOSIS — G894 Chronic pain syndrome: Secondary | ICD-10-CM | POA: Diagnosis not present

## 2023-03-19 DIAGNOSIS — M47817 Spondylosis without myelopathy or radiculopathy, lumbosacral region: Secondary | ICD-10-CM | POA: Diagnosis not present

## 2023-03-24 ENCOUNTER — Other Ambulatory Visit: Payer: Self-pay | Admitting: Gastroenterology

## 2023-03-24 DIAGNOSIS — K580 Irritable bowel syndrome with diarrhea: Secondary | ICD-10-CM

## 2023-04-11 ENCOUNTER — Encounter: Payer: Self-pay | Admitting: *Deleted

## 2023-04-16 DIAGNOSIS — M25562 Pain in left knee: Secondary | ICD-10-CM | POA: Diagnosis not present

## 2023-04-16 DIAGNOSIS — Z79891 Long term (current) use of opiate analgesic: Secondary | ICD-10-CM | POA: Diagnosis not present

## 2023-04-16 DIAGNOSIS — G894 Chronic pain syndrome: Secondary | ICD-10-CM | POA: Diagnosis not present

## 2023-04-22 ENCOUNTER — Other Ambulatory Visit: Payer: Self-pay | Admitting: Gastroenterology

## 2023-04-22 DIAGNOSIS — K580 Irritable bowel syndrome with diarrhea: Secondary | ICD-10-CM

## 2023-04-22 MED ORDER — DICYCLOMINE HCL 10 MG PO CAPS: 10.0000 mg | ORAL_CAPSULE | Freq: Three times a day (TID) | ORAL | 1 refills | Status: AC | PRN

## 2023-05-14 DIAGNOSIS — M25562 Pain in left knee: Secondary | ICD-10-CM | POA: Diagnosis not present

## 2023-05-14 DIAGNOSIS — G894 Chronic pain syndrome: Secondary | ICD-10-CM | POA: Diagnosis not present

## 2023-05-14 DIAGNOSIS — Z79891 Long term (current) use of opiate analgesic: Secondary | ICD-10-CM | POA: Diagnosis not present

## 2023-06-11 DIAGNOSIS — Z79891 Long term (current) use of opiate analgesic: Secondary | ICD-10-CM | POA: Diagnosis not present

## 2023-06-11 DIAGNOSIS — M25562 Pain in left knee: Secondary | ICD-10-CM | POA: Diagnosis not present

## 2023-06-11 DIAGNOSIS — G894 Chronic pain syndrome: Secondary | ICD-10-CM | POA: Diagnosis not present

## 2023-07-16 DIAGNOSIS — Z79891 Long term (current) use of opiate analgesic: Secondary | ICD-10-CM | POA: Diagnosis not present

## 2023-07-16 DIAGNOSIS — G894 Chronic pain syndrome: Secondary | ICD-10-CM | POA: Diagnosis not present

## 2023-07-16 DIAGNOSIS — M25562 Pain in left knee: Secondary | ICD-10-CM | POA: Diagnosis not present

## 2023-07-24 DIAGNOSIS — Z7984 Long term (current) use of oral hypoglycemic drugs: Secondary | ICD-10-CM | POA: Diagnosis not present

## 2023-07-24 DIAGNOSIS — E119 Type 2 diabetes mellitus without complications: Secondary | ICD-10-CM | POA: Diagnosis not present

## 2023-07-24 DIAGNOSIS — H16223 Keratoconjunctivitis sicca, not specified as Sjogren's, bilateral: Secondary | ICD-10-CM | POA: Diagnosis not present

## 2023-07-25 DIAGNOSIS — E1165 Type 2 diabetes mellitus with hyperglycemia: Secondary | ICD-10-CM | POA: Diagnosis not present

## 2023-07-25 DIAGNOSIS — Z794 Long term (current) use of insulin: Secondary | ICD-10-CM | POA: Diagnosis not present

## 2023-07-25 DIAGNOSIS — Z299 Encounter for prophylactic measures, unspecified: Secondary | ICD-10-CM | POA: Diagnosis not present

## 2023-07-25 DIAGNOSIS — R1110 Vomiting, unspecified: Secondary | ICD-10-CM | POA: Diagnosis not present

## 2023-07-25 DIAGNOSIS — I10 Essential (primary) hypertension: Secondary | ICD-10-CM | POA: Diagnosis not present

## 2023-07-26 DIAGNOSIS — R1110 Vomiting, unspecified: Secondary | ICD-10-CM | POA: Diagnosis not present

## 2023-08-20 DIAGNOSIS — G894 Chronic pain syndrome: Secondary | ICD-10-CM | POA: Diagnosis not present

## 2023-08-20 DIAGNOSIS — M25562 Pain in left knee: Secondary | ICD-10-CM | POA: Diagnosis not present

## 2023-08-20 DIAGNOSIS — Z79891 Long term (current) use of opiate analgesic: Secondary | ICD-10-CM | POA: Diagnosis not present

## 2023-09-17 ENCOUNTER — Other Ambulatory Visit: Payer: Self-pay | Admitting: Internal Medicine

## 2023-09-17 DIAGNOSIS — Z1231 Encounter for screening mammogram for malignant neoplasm of breast: Secondary | ICD-10-CM

## 2023-09-18 DIAGNOSIS — G894 Chronic pain syndrome: Secondary | ICD-10-CM | POA: Diagnosis not present

## 2023-09-18 DIAGNOSIS — Z79891 Long term (current) use of opiate analgesic: Secondary | ICD-10-CM | POA: Diagnosis not present

## 2023-09-18 DIAGNOSIS — M25562 Pain in left knee: Secondary | ICD-10-CM | POA: Diagnosis not present

## 2023-10-23 DIAGNOSIS — Z79891 Long term (current) use of opiate analgesic: Secondary | ICD-10-CM | POA: Diagnosis not present

## 2023-10-23 DIAGNOSIS — G894 Chronic pain syndrome: Secondary | ICD-10-CM | POA: Diagnosis not present

## 2023-10-23 DIAGNOSIS — M47817 Spondylosis without myelopathy or radiculopathy, lumbosacral region: Secondary | ICD-10-CM | POA: Diagnosis not present

## 2023-11-07 DIAGNOSIS — E1165 Type 2 diabetes mellitus with hyperglycemia: Secondary | ICD-10-CM | POA: Diagnosis not present

## 2023-11-07 DIAGNOSIS — J069 Acute upper respiratory infection, unspecified: Secondary | ICD-10-CM | POA: Diagnosis not present

## 2023-11-07 DIAGNOSIS — Z299 Encounter for prophylactic measures, unspecified: Secondary | ICD-10-CM | POA: Diagnosis not present

## 2023-11-07 DIAGNOSIS — Z794 Long term (current) use of insulin: Secondary | ICD-10-CM | POA: Diagnosis not present

## 2023-11-07 DIAGNOSIS — I10 Essential (primary) hypertension: Secondary | ICD-10-CM | POA: Diagnosis not present

## 2023-11-26 DIAGNOSIS — Z79891 Long term (current) use of opiate analgesic: Secondary | ICD-10-CM | POA: Diagnosis not present

## 2023-11-26 DIAGNOSIS — G894 Chronic pain syndrome: Secondary | ICD-10-CM | POA: Diagnosis not present

## 2023-11-26 DIAGNOSIS — M47817 Spondylosis without myelopathy or radiculopathy, lumbosacral region: Secondary | ICD-10-CM | POA: Diagnosis not present

## 2023-11-26 DIAGNOSIS — M961 Postlaminectomy syndrome, not elsewhere classified: Secondary | ICD-10-CM | POA: Diagnosis not present

## 2023-12-24 ENCOUNTER — Ambulatory Visit
Admission: RE | Admit: 2023-12-24 | Discharge: 2023-12-24 | Disposition: A | Discharge status: Home / Self Care | Payer: 59 | Source: Ambulatory Visit | Attending: Internal Medicine | Admitting: Internal Medicine

## 2023-12-24 DIAGNOSIS — Z1231 Encounter for screening mammogram for malignant neoplasm of breast: Secondary | ICD-10-CM

## 2023-12-25 ENCOUNTER — Other Ambulatory Visit: Payer: Self-pay | Admitting: Internal Medicine

## 2023-12-25 DIAGNOSIS — N644 Mastodynia: Secondary | ICD-10-CM

## 2023-12-25 DIAGNOSIS — G894 Chronic pain syndrome: Secondary | ICD-10-CM | POA: Diagnosis not present

## 2023-12-25 DIAGNOSIS — M961 Postlaminectomy syndrome, not elsewhere classified: Secondary | ICD-10-CM | POA: Diagnosis not present

## 2023-12-25 DIAGNOSIS — Z79891 Long term (current) use of opiate analgesic: Secondary | ICD-10-CM | POA: Diagnosis not present

## 2023-12-25 DIAGNOSIS — M47817 Spondylosis without myelopathy or radiculopathy, lumbosacral region: Secondary | ICD-10-CM | POA: Diagnosis not present

## 2024-01-03 DIAGNOSIS — I10 Essential (primary) hypertension: Secondary | ICD-10-CM | POA: Diagnosis not present

## 2024-01-03 DIAGNOSIS — N63 Unspecified lump in unspecified breast: Secondary | ICD-10-CM | POA: Diagnosis not present

## 2024-01-03 DIAGNOSIS — G4700 Insomnia, unspecified: Secondary | ICD-10-CM | POA: Diagnosis not present

## 2024-01-03 DIAGNOSIS — Z299 Encounter for prophylactic measures, unspecified: Secondary | ICD-10-CM | POA: Diagnosis not present

## 2024-01-30 DIAGNOSIS — Z79891 Long term (current) use of opiate analgesic: Secondary | ICD-10-CM | POA: Diagnosis not present

## 2024-01-30 DIAGNOSIS — M47817 Spondylosis without myelopathy or radiculopathy, lumbosacral region: Secondary | ICD-10-CM | POA: Diagnosis not present

## 2024-02-03 DIAGNOSIS — N6311 Unspecified lump in the right breast, upper outer quadrant: Secondary | ICD-10-CM | POA: Diagnosis not present

## 2024-02-17 DIAGNOSIS — Z794 Long term (current) use of insulin: Secondary | ICD-10-CM | POA: Diagnosis not present

## 2024-02-17 DIAGNOSIS — J309 Allergic rhinitis, unspecified: Secondary | ICD-10-CM | POA: Diagnosis not present

## 2024-02-17 DIAGNOSIS — E1169 Type 2 diabetes mellitus with other specified complication: Secondary | ICD-10-CM | POA: Diagnosis not present

## 2024-02-17 DIAGNOSIS — I10 Essential (primary) hypertension: Secondary | ICD-10-CM | POA: Diagnosis not present

## 2024-02-17 DIAGNOSIS — Z299 Encounter for prophylactic measures, unspecified: Secondary | ICD-10-CM | POA: Diagnosis not present

## 2024-02-27 DIAGNOSIS — M25511 Pain in right shoulder: Secondary | ICD-10-CM | POA: Diagnosis not present

## 2024-02-27 DIAGNOSIS — Z79891 Long term (current) use of opiate analgesic: Secondary | ICD-10-CM | POA: Diagnosis not present

## 2024-02-27 DIAGNOSIS — G894 Chronic pain syndrome: Secondary | ICD-10-CM | POA: Diagnosis not present

## 2024-03-26 DIAGNOSIS — M25511 Pain in right shoulder: Secondary | ICD-10-CM | POA: Diagnosis not present

## 2024-03-26 DIAGNOSIS — G894 Chronic pain syndrome: Secondary | ICD-10-CM | POA: Diagnosis not present

## 2024-04-02 ENCOUNTER — Telehealth: Payer: Self-pay | Admitting: Orthopedic Surgery

## 2024-04-02 NOTE — Telephone Encounter (Signed)
 Returned the pt's call, lvm for her to cb.

## 2024-04-23 DIAGNOSIS — M25561 Pain in right knee: Secondary | ICD-10-CM | POA: Diagnosis not present

## 2024-04-23 DIAGNOSIS — Z79891 Long term (current) use of opiate analgesic: Secondary | ICD-10-CM | POA: Diagnosis not present

## 2024-04-23 DIAGNOSIS — M961 Postlaminectomy syndrome, not elsewhere classified: Secondary | ICD-10-CM | POA: Diagnosis not present

## 2024-04-23 DIAGNOSIS — G894 Chronic pain syndrome: Secondary | ICD-10-CM | POA: Diagnosis not present

## 2024-04-23 DIAGNOSIS — M47817 Spondylosis without myelopathy or radiculopathy, lumbosacral region: Secondary | ICD-10-CM | POA: Diagnosis not present

## 2024-04-23 DIAGNOSIS — M25512 Pain in left shoulder: Secondary | ICD-10-CM | POA: Diagnosis not present

## 2024-05-21 DIAGNOSIS — Z79891 Long term (current) use of opiate analgesic: Secondary | ICD-10-CM | POA: Diagnosis not present

## 2024-05-21 DIAGNOSIS — G894 Chronic pain syndrome: Secondary | ICD-10-CM | POA: Diagnosis not present

## 2024-05-21 DIAGNOSIS — M25512 Pain in left shoulder: Secondary | ICD-10-CM | POA: Diagnosis not present

## 2024-05-21 DIAGNOSIS — M25561 Pain in right knee: Secondary | ICD-10-CM | POA: Diagnosis not present

## 2024-05-21 DIAGNOSIS — M47817 Spondylosis without myelopathy or radiculopathy, lumbosacral region: Secondary | ICD-10-CM | POA: Diagnosis not present

## 2024-05-21 DIAGNOSIS — M961 Postlaminectomy syndrome, not elsewhere classified: Secondary | ICD-10-CM | POA: Diagnosis not present

## 2024-06-01 ENCOUNTER — Other Ambulatory Visit: Payer: Self-pay

## 2024-06-01 ENCOUNTER — Ambulatory Visit (INDEPENDENT_AMBULATORY_CARE_PROVIDER_SITE_OTHER): Admitting: Orthopedic Surgery

## 2024-06-01 ENCOUNTER — Other Ambulatory Visit (INDEPENDENT_AMBULATORY_CARE_PROVIDER_SITE_OTHER)

## 2024-06-01 DIAGNOSIS — M170 Bilateral primary osteoarthritis of knee: Secondary | ICD-10-CM | POA: Diagnosis not present

## 2024-06-01 DIAGNOSIS — M1712 Unilateral primary osteoarthritis, left knee: Secondary | ICD-10-CM

## 2024-06-01 DIAGNOSIS — M1711 Unilateral primary osteoarthritis, right knee: Secondary | ICD-10-CM | POA: Diagnosis not present

## 2024-06-01 DIAGNOSIS — M25561 Pain in right knee: Secondary | ICD-10-CM | POA: Diagnosis not present

## 2024-06-01 DIAGNOSIS — G8929 Other chronic pain: Secondary | ICD-10-CM

## 2024-06-01 DIAGNOSIS — M25562 Pain in left knee: Secondary | ICD-10-CM | POA: Diagnosis not present

## 2024-06-01 NOTE — Progress Notes (Signed)
  Intake history:  There were no vitals taken for this visit. There is no height or weight on file to calculate BMI.    WHAT ARE WE SEEING YOU FOR TODAY?   bilateral knee(s)- left knee worse than right  How long has this bothered you? (DOI?DOS?WS?)  Years ago  Anticoag.  Yes  Diabetes Yes  Heart disease No  Hypertension Yes  SMOKING HX No  Kidney disease No  Any ALLERGIES gabapentin______________________________________________   Treatment:  Have you taken:   Other  _lidocaine and pain med given from pain clinic________________________

## 2024-06-01 NOTE — Progress Notes (Signed)
  Subjective:     Patient ID: [name], female   DOB: [birth year], 58 y.o.   MRN: [id number]  Chief Complaint  Patient presents with   Knee Pain    Bilateral knee pain- left worse than the right. No injury been going on for years. Taking a pain med that was given from pain clinic    58 year old female bilateral knee pain primarily in the front of her knee.  Last x-ray was 2021.  Patient planes of increasing pain which was relieved by hyaluronic acid injections but when she went back to get them a second time her insurance had changed and they would not approve it  No catching locking giving way or mechanical symptoms are noted  Knee Pain      Review of Systems history of lower back pain does not seem to be radicular in nature in terms of her knee pain this time     Objective:   Physical Exam History of knock knees  Valgus alignment both knees  Range of motion does not appear to be affected  Gait remains normal  Lateral compartments are tender patellofemoral joints are to have crepitance  Patient walks without support  No limping is noted    Assessment:      Encounter Diagnoses  Name Primary?   Chronic pain of both knees Yes   Primary osteoarthritis of right knee    Primary osteoarthritis of left knee     Imaging studies show mild to moderate arthritis both patellofemoral joints mild arthritis tibiofemoral joints see report for details     Plan:      X-rays show that the patient is not at risk for knee replacements at this time as she has plenty of joint space remaining  Since the injections helped her last time recommend HA injections be repeated Recommend HA injections

## 2024-06-01 NOTE — Patient Instructions (Signed)

## 2024-06-03 ENCOUNTER — Telehealth: Payer: Self-pay

## 2024-06-03 NOTE — Telephone Encounter (Signed)
VOB submitted for Durolane, bilateral knee  

## 2024-06-04 ENCOUNTER — Telehealth: Payer: Self-pay

## 2024-06-04 NOTE — Telephone Encounter (Signed)
 Please schedule patient for gel injection with Dr. Margrette.  All gel information can be located under the referrals tab.

## 2024-06-04 NOTE — Telephone Encounter (Signed)
-----   Message from [facility] S sent at 06/01/2024  3:20 PM EDT ----- From Dr. Margrette: ----- Message ----- From: Margrette Taft BRAVO, MD Sent: 06/01/2024   2:27 PM EDT To: Greig LELON Eke, RT; Hope H Surrett  Needs HA approval

## 2024-06-09 ENCOUNTER — Telehealth: Payer: Self-pay | Admitting: Orthopedic Surgery

## 2024-06-09 NOTE — Telephone Encounter (Signed)
 Thank You.

## 2024-06-09 NOTE — Telephone Encounter (Signed)
 I have called the patient and lvm for her to call back to schedule:  Approved for Durolane, bilateral knee Buy & Bill Patient will be responsible for 20% OOP No Co-pay No PA required

## 2024-06-11 ENCOUNTER — Other Ambulatory Visit (INDEPENDENT_AMBULATORY_CARE_PROVIDER_SITE_OTHER)

## 2024-06-11 ENCOUNTER — Ambulatory Visit (INDEPENDENT_AMBULATORY_CARE_PROVIDER_SITE_OTHER): Admitting: Orthopedic Surgery

## 2024-06-11 DIAGNOSIS — M25512 Pain in left shoulder: Secondary | ICD-10-CM

## 2024-06-11 DIAGNOSIS — G8929 Other chronic pain: Secondary | ICD-10-CM

## 2024-06-11 DIAGNOSIS — M25511 Pain in right shoulder: Secondary | ICD-10-CM | POA: Diagnosis not present

## 2024-06-11 MED ORDER — METHYLPREDNISOLONE ACETATE 40 MG/ML IJ SUSP
40.0000 mg | Freq: Once | INTRAMUSCULAR | Status: AC
  Administered 2024-06-11: 40 mg via INTRA_ARTICULAR

## 2024-06-11 NOTE — Addendum Note (Signed)
 Addended by: VICENTA EMMIE HERO on: 06/11/2024 04:04 PM   Modules accepted: Orders

## 2024-06-11 NOTE — Progress Notes (Signed)
 Chief Complaint  Patient presents with   Shoulder Pain    Bilateral shoulder pain going on for 4 months. Hurts when lays down- wakes at night.     History this is a 58 year old female whose had left and right shoulder pain over the years comes in with recurrent increasing pain in the left shoulder and pain in the right shoulder which will occur when she is lying on either side or will wake her up at night  She also has a constellation of symptoms to include numbness in the right hand especially with the elbow flexed.  She denies any trauma.  The numbness in the hand is more global than isolated to see the carpal tunnel related digits.  Physical Exam Vitals and nursing note reviewed.  Constitutional:      Appearance: Normal appearance.  HENT:     Head: Normocephalic and atraumatic.  Eyes:     General: No scleral icterus.       Right eye: No discharge.        Left eye: No discharge.     Extraocular Movements: Extraocular movements intact.     Conjunctiva/sclera: Conjunctivae normal.     Pupils: Pupils are equal, round, and reactive to light.  Cardiovascular:     Rate and Rhythm: Normal rate.     Pulses: Normal pulses.  Musculoskeletal:     Comments: Right shoulder Deltoid pain  Skin normal  Range of motion normal  Stability test normal  Muscle strength and muscle tone normal  Provocative test Neer normal Hawkins positive  Left shoulder  Deltoid pain  Skin normal  Range of motion normal  Muscle strength muscle tone normal  Stability test normal  Provocative tests Neer positive    Skin:    General: Skin is warm and dry.     Capillary Refill: Capillary refill takes less than 2 seconds.  Neurological:     General: No focal deficit present.     Mental Status: She is alert and oriented to person, place, and time.  Psychiatric:        Mood and Affect: Mood normal.        Behavior: Behavior normal.        Thought Content: Thought content normal.         Judgment: Judgment normal.    Assessment and plan No results found.   Prior x-rays of the shoulder did not show any significant disease  Today's spine x-ray nothing to write home about  I think she has an impingement syndrome 1 on each side  And I think she may have a cubital tunnel issue   My recommendations are for injection both subacromial spaces  Nerve conduction study  Procedure injection right shoulder subacromial joint and left shoulder subacromial joint   procedure note the subacromial injection shoulder RIGHT  Verbal consent was obtained to inject the  RIGHT   Shoulder  Timeout was completed to confirm the injection site is a subacromial space of the  RIGHT  shoulder   Medication used Depo-Medrol  40 mg and lidocaine  1% 3 cc  Anesthesia was provided by ethyl chloride  The injection was performed in the RIGHT  posterior subacromial space. After pinning the skin with alcohol  and anesthetized the skin with ethyl chloride the subacromial space was injected using a 20-gauge needle. There were no complications  Sterile dressing was applied.    Procedure note the subacromial injection shoulder left   Verbal consent was obtained to inject the  Left  Shoulder  Timeout was completed to confirm the injection site is a subacromial space of the  left  shoulder  Medication used Depo-Medrol  40 mg and lidocaine  1% 3 cc  Anesthesia was provided by ethyl chloride  The injection was performed in the left  posterior subacromial space. After pinning the skin with alcohol  and anesthetized the skin with ethyl chloride the subacromial space was injected using a 20-gauge needle. There were no complications  Sterile dressing was applied.    We would like her to return to clinic with her nerve conduction study read and in the chart

## 2024-06-15 ENCOUNTER — Ambulatory Visit (INDEPENDENT_AMBULATORY_CARE_PROVIDER_SITE_OTHER): Admitting: Orthopedic Surgery

## 2024-06-15 DIAGNOSIS — G8929 Other chronic pain: Secondary | ICD-10-CM

## 2024-06-15 DIAGNOSIS — M170 Bilateral primary osteoarthritis of knee: Secondary | ICD-10-CM

## 2024-06-15 DIAGNOSIS — M1712 Unilateral primary osteoarthritis, left knee: Secondary | ICD-10-CM

## 2024-06-15 DIAGNOSIS — M1711 Unilateral primary osteoarthritis, right knee: Secondary | ICD-10-CM

## 2024-06-15 NOTE — Progress Notes (Signed)
  Chief Complaint  Patient presents with   Injections    Bilateral knee injections    Encounter Diagnoses  Name Primary?   Primary osteoarthritis of right knee Yes   Primary osteoarthritis of left knee    Chronic pain of both knees    Chronic right shoulder pain     Ms. Kewley has osteoarthritis of both knees and is here for bilateral Durolane one-shot injections  Injection #1 right knee  The patient consented for hyaluronic injection in the right knee Site confirmed medication confirmed as Durolane Skin prepped with alcohol  Ethyl chloride was used for anesthesia Injection was done from a lateral approach no complication Patient was in normal state and was released  This was repeated for injection #2 left knee

## 2024-06-18 DIAGNOSIS — I10 Essential (primary) hypertension: Secondary | ICD-10-CM | POA: Diagnosis not present

## 2024-06-18 DIAGNOSIS — M545 Low back pain, unspecified: Secondary | ICD-10-CM | POA: Diagnosis not present

## 2024-06-18 DIAGNOSIS — E119 Type 2 diabetes mellitus without complications: Secondary | ICD-10-CM | POA: Diagnosis not present

## 2024-06-18 DIAGNOSIS — Z299 Encounter for prophylactic measures, unspecified: Secondary | ICD-10-CM | POA: Diagnosis not present

## 2024-06-25 DIAGNOSIS — M47817 Spondylosis without myelopathy or radiculopathy, lumbosacral region: Secondary | ICD-10-CM | POA: Diagnosis not present

## 2024-06-25 DIAGNOSIS — M25512 Pain in left shoulder: Secondary | ICD-10-CM | POA: Diagnosis not present

## 2024-06-25 DIAGNOSIS — Z79891 Long term (current) use of opiate analgesic: Secondary | ICD-10-CM | POA: Diagnosis not present

## 2024-06-25 DIAGNOSIS — M961 Postlaminectomy syndrome, not elsewhere classified: Secondary | ICD-10-CM | POA: Diagnosis not present

## 2024-06-25 DIAGNOSIS — G894 Chronic pain syndrome: Secondary | ICD-10-CM | POA: Diagnosis not present

## 2024-06-25 DIAGNOSIS — M25561 Pain in right knee: Secondary | ICD-10-CM | POA: Diagnosis not present

## 2024-07-21 ENCOUNTER — Telehealth: Payer: Self-pay | Admitting: Physical Medicine and Rehabilitation

## 2024-07-21 NOTE — Telephone Encounter (Signed)
Patient's appointment rescheduled.

## 2024-07-21 NOTE — Telephone Encounter (Signed)
 Patient called. She would like to rsc her appointment

## 2024-07-23 DIAGNOSIS — M25512 Pain in left shoulder: Secondary | ICD-10-CM | POA: Diagnosis not present

## 2024-07-23 DIAGNOSIS — Z79891 Long term (current) use of opiate analgesic: Secondary | ICD-10-CM | POA: Diagnosis not present

## 2024-07-23 DIAGNOSIS — G894 Chronic pain syndrome: Secondary | ICD-10-CM | POA: Diagnosis not present

## 2024-07-23 DIAGNOSIS — M47817 Spondylosis without myelopathy or radiculopathy, lumbosacral region: Secondary | ICD-10-CM | POA: Diagnosis not present

## 2024-07-23 DIAGNOSIS — M961 Postlaminectomy syndrome, not elsewhere classified: Secondary | ICD-10-CM | POA: Diagnosis not present

## 2024-07-28 ENCOUNTER — Encounter: Admitting: Physical Medicine and Rehabilitation

## 2024-08-11 ENCOUNTER — Encounter: Admitting: Physical Medicine and Rehabilitation

## 2024-08-20 DIAGNOSIS — M961 Postlaminectomy syndrome, not elsewhere classified: Secondary | ICD-10-CM | POA: Diagnosis not present

## 2024-08-20 DIAGNOSIS — M47817 Spondylosis without myelopathy or radiculopathy, lumbosacral region: Secondary | ICD-10-CM | POA: Diagnosis not present

## 2024-08-20 DIAGNOSIS — G894 Chronic pain syndrome: Secondary | ICD-10-CM | POA: Diagnosis not present

## 2024-08-20 DIAGNOSIS — M25561 Pain in right knee: Secondary | ICD-10-CM | POA: Diagnosis not present

## 2024-08-20 DIAGNOSIS — Z79891 Long term (current) use of opiate analgesic: Secondary | ICD-10-CM | POA: Diagnosis not present

## 2024-08-20 DIAGNOSIS — M25512 Pain in left shoulder: Secondary | ICD-10-CM | POA: Diagnosis not present

## 2024-08-21 ENCOUNTER — Encounter: Admitting: Physical Medicine and Rehabilitation

## 2024-08-28 NOTE — Progress Notes (Signed)
 [name]                                          MRN: [id number]   08/28/2024   The [facility] Quality Team Specialist reviewed this patient medical record for the purposes of chart review for care gap closure. The following were reviewed: chart review for care gap closure-kidney health evaluation for diabetes:eGFR  and uACR.    [facility] Quality Team

## 2024-09-16 ENCOUNTER — Encounter: Admitting: Physical Medicine and Rehabilitation

## 2024-09-21 ENCOUNTER — Encounter: Payer: Self-pay | Admitting: Radiology

## 2024-10-12 NOTE — Progress Notes (Signed)
 [name] - 58 y.o. female MRN [id number]  Date of birth: 22-Dec-1965  Office Visit Note: Visit Date: 10/13/2024 PCP: Maree Isles, MD Referred by: Margrette Taft BRAVO, MD  Subjective: Chief Complaint  Patient presents with   Right Shoulder - Pain, Numbness, Weakness   HPI: [name] is a 58 y.o. female who comes in today at the request of Dr. Taft Margrette for evaluation and management of chronic, worsening and severe pain, numbness and tingling in the Right upper extremities.  Patient is Right hand dominant.  She describes although somewhat poor historian of recurrent bilateral shoulder pain and more right shoulder pain recently.  The shoulder will hurt when she is lying on either side of the bed at night and sometimes it will wake her up.  She feels like she gets shoulder pain and then she feels like this is causing her arm and hand to have pain and numbness and tingling.  The symptoms in the hand are somewhat global but may be more radial sided.  She does have a positive flick sign at night.  She has a history of neck pain.  No prior cervical surgery.  She has had no recent trauma.  No real left-sided hand complaints.  She is not diabetic.  Her medical history is quite complicated with chronic pain syndrome on chronic pain medications and Lyrica  and nerve burning syndrome of the medications.  She has a history of migraine and stroke and is on Plavix.  She has had multiple musculoskeletal and orthopedic complaints.  No prior electrodiagnostic study.   I spent more than 30 minutes speaking face-to-face with the patient with 50% of the time in counseling and discussing coordination of care.      Review of Systems  Musculoskeletal:  Positive for back pain, joint pain and neck pain.  Neurological:  Positive for tingling and focal weakness.  All other systems reviewed and are negative.  Otherwise per HPI.  Assessment & Plan: Visit Diagnoses:    ICD-10-CM   1. Paresthesia of  skin  R20.2 NCV with EMG (electromyography)    2. Chronic right shoulder pain  M25.511    G89.29     3. Right arm pain  M79.601     4. Pain in right hand  M79.641     5. Right hand weakness  R29.898        Plan: Impression: She feels that her symptoms are related to the shoulder proper but clearly having numbness and tingling with positive flick sign she likely has some underlying carpal tunnel syndrome.  History is really complicated to chronic pain syndrome and chronic orthopedic complaints in the past.  Electrodiagnostic study performed today.  Clinical correlation is paramount.  The above electrodiagnostic study is ABNORMAL and reveals evidence of a moderate right median nerve entrapment at the wrist (carpal tunnel syndrome) affecting sensory and motor components.   There is no significant electrodiagnostic evidence of any other focal nerve entrapment, brachial plexopathy or cervical radiculopathy.   Recommendations: 1.  Follow-up with referring physician. 2.  Continue current management of symptoms. 3.  Continue use of resting splint at night-time and as needed during the day. 4.  Suggest surgical evaluation.  Meds & Orders: No orders of the defined types were placed in this encounter.   Orders Placed This Encounter  Procedures   NCV with EMG (electromyography)    Follow-up: Return for Taft Margrette, MD.   Procedures: No procedures performed  EMG & NCV Findings:  Evaluation of the right median motor nerve showed prolonged distal onset latency (4.5 ms) and decreased conduction velocity (Elbow-Wrist, 46 m/s).  The right median (across palm) sensory nerve showed prolonged distal peak latency (Wrist, 5.0 ms) and prolonged distal peak latency (Palm, 2.6 ms).  All remaining nerves (as indicated in the following tables) were within normal limits.    All examined muscles (as indicated in the following table) showed no evidence of electrical instability.    Impression: The above  electrodiagnostic study is ABNORMAL and reveals evidence of a moderate right median nerve entrapment at the wrist (carpal tunnel syndrome) affecting sensory and motor components.   There is no significant electrodiagnostic evidence of any other focal nerve entrapment, brachial plexopathy or cervical radiculopathy.   Recommendations: 1.  Follow-up with referring physician. 2.  Continue current management of symptoms. 3.  Continue use of resting splint at night-time and as needed during the day. 4.  Suggest surgical evaluation.  ___________________________ Prentice Masters FAAPMR Board Certified, American Board of Physical Medicine and Rehabilitation    Nerve Conduction Studies Anti Sensory Summary Table   Stim Site NR Peak (ms) Norm Peak (ms) P-T Amp (V) Norm P-T Amp Site1 Site2 Delta-P (ms) Dist (cm) Vel (m/s) Norm Vel (m/s)  Right Median Acr Palm Anti Sensory (2nd Digit)  29.9C  Wrist    *5.0 <3.6 24.4 >10 Wrist Palm 2.4 0.0    Palm    *2.6 <2.0 0.2         Right Radial Anti Sensory (Base 1st Digit)  30.3C  Wrist    2.1 <3.1 18.8  Wrist Base 1st Digit 2.1 0.0    Right Ulnar Anti Sensory (5th Digit)  30.4C  Wrist    3.3 <3.7 22.3 >15.0 Wrist 5th Digit 3.3 14.0 42 >38   Motor Summary Table   Stim Site NR Onset (ms) Norm Onset (ms) O-P Amp (mV) Norm O-P Amp Site1 Site2 Delta-0 (ms) Dist (cm) Vel (m/s) Norm Vel (m/s)  Right Median Motor (Abd Poll Brev)  30.4C  Wrist    *4.5 <4.2 9.5 >5 Elbow Wrist 5.6 26.0 *46 >50  Elbow    10.1  2.7         Right Ulnar Motor (Abd Dig Min)  30.5C  Wrist    2.8 <4.2 14.2 >3 B Elbow Wrist 4.5 24.0 53 >53  B Elbow    7.3  5.2  A Elbow B Elbow 1.2 10.0 83 >53  A Elbow    8.5  13.1          EMG   Side Muscle Nerve Root Ins Act Fibs Psw Amp Dur Poly Recrt Int Bruna Comment  Right 1stDorInt Ulnar C8-T1 Nml Nml Nml Nml Nml 0 Nml Nml   Right Abd Poll Brev Median C8-T1 Nml Nml Nml Nml Nml 0 Nml Nml   Right ExtDigCom   Nml Nml Nml Nml Nml 0 Nml Nml    Right Triceps Radial C6-7-8 Nml Nml Nml Nml Nml 0 Nml Nml   Right Deltoid Axillary C5-6 Nml Nml Nml Nml Nml 0 Nml Nml     Nerve Conduction Studies Anti Sensory Left/Right Comparison   Stim Site L Lat (ms) R Lat (ms) L-R Lat (ms) L Amp (V) R Amp (V) L-R Amp (%) Site1 Site2 L Vel (m/s) R Vel (m/s) L-R Vel (m/s)  Median Acr Palm Anti Sensory (2nd Digit)  29.9C  Wrist  *5.0   24.4  Wrist Palm     Palm  *  2.6   0.2        Radial Anti Sensory (Base 1st Digit)  30.3C  Wrist  2.1   18.8  Wrist Base 1st Digit     Ulnar Anti Sensory (5th Digit)  30.4C  Wrist  3.3   22.3  Wrist 5th Digit  42    Motor Left/Right Comparison   Stim Site L Lat (ms) R Lat (ms) L-R Lat (ms) L Amp (mV) R Amp (mV) L-R Amp (%) Site1 Site2 L Vel (m/s) R Vel (m/s) L-R Vel (m/s)  Median Motor (Abd Poll Brev)  30.4C  Wrist  *4.5   9.5  Elbow Wrist  *46   Elbow  10.1   2.7        Ulnar Motor (Abd Dig Min)  30.5C  Wrist  2.8   14.2  B Elbow Wrist  53   B Elbow  7.3   5.2  A Elbow B Elbow  83   A Elbow  8.5   13.1           Waveforms:            Clinical History: No specialty comments available.   She reports that she quit smoking about 4 years ago. Her smoking use included cigarettes. She started smoking about 6 years ago. She has a 1 pack-year smoking history. She has never used smokeless tobacco. No results for input(s): HGBA1C, LABURIC in the last 8760 hours.  Objective:  VS:  HT:    WT:   BMI:     BP:   HR: bpm  TEMP: ( )  RESP:  Physical Exam Vitals and nursing note reviewed.  Constitutional:      General: She is not in acute distress.    Appearance: Normal appearance. She is well-developed. She is obese. She is not ill-appearing.  HENT:     Head: Normocephalic and atraumatic.     Nose: Nose normal.     Mouth/Throat:     Mouth: Mucous membranes are moist.     Pharynx: Oropharynx is clear.  Eyes:     Conjunctiva/sclera: Conjunctivae normal.     Pupils: Pupils are equal, round, and  reactive to light.  Cardiovascular:     Rate and Rhythm: Normal rate and regular rhythm.     Pulses: Normal pulses.  Pulmonary:     Effort: Pulmonary effort is normal. No respiratory distress.  Abdominal:     General: There is no distension.     Palpations: Abdomen is soft.     Tenderness: There is no guarding.  Musculoskeletal:        General: Tenderness present. No swelling or deformity.     Cervical back: Normal range of motion and neck supple.     Right lower leg: No edema.     Left lower leg: No edema.     Comments: Inspection reveals no atrophy of the bilateral APB or FDI or hand intrinsics. There is no swelling, color changes, allodynia or dystrophic changes. There is 5 out of 5 strength in the bilateral wrist extension, finger abduction and long finger flexion. There is intact sensation to light touch in all dermatomal and peripheral nerve distributions.   There is a weakly positive Phalen's test on the right. There is a negative Hoffmann's test bilaterally.  Skin:    General: Skin is warm and dry.     Findings: No erythema or rash.  Neurological:     General: No focal deficit present.  Mental Status: She is alert and oriented to person, place, and time.     Cranial Nerves: No cranial nerve deficit.     Sensory: No sensory deficit.     Motor: No weakness or abnormal muscle tone.     Coordination: Coordination normal.     Gait: Gait abnormal.  Psychiatric:        Mood and Affect: Mood normal.        Behavior: Behavior normal.        Thought Content: Thought content normal.     Ortho Exam  Imaging: No results found.  Past Medical/Family/Surgical/Social History: Medications & Allergies reviewed per EMR, new medications updated. Patient Active Problem List   Diagnosis Date Noted   Cerebrovascular accident (CVA) due to embolism (HCC) 07/08/2020   History of lumbar fusion 01/16/2019   Recurrent displacement of lumbar disc 08/21/2018   Leucocytosis 02/14/2018    Hypokalemia 02/14/2018   Dysphagia 12/20/2017   Abdominal pain, epigastric 12/20/2017   Diarrhea 12/20/2017   History of colonic polyps 12/20/2017   Shoulder pain, left 04/02/2017   Postop check 12/02/2015   Pelvic peritoneal adhesions, female,postsurgical 11/22/2015   Tubo-ovarian adhesions postsurgical 11/22/2015   Postoperative female pelvic peritoneal adhesions 11/22/2015   Para-ovarian adhesion left 11/17/2015   Globus sensation 05/18/2015   Dysphagia, pharyngoesophageal phase 03/29/2015   Lumbar radiculopathy 03/08/2015   Perimenopausal vasomotor symptoms 09/16/2014   Torticollis 09/16/2013   Migraine without aura 09/16/2013   Torticollis, spasmodic 05/26/2013   Rotator cuff syndrome of right shoulder 05/26/2013   Pain in joint, upper arm 05/26/2013   Dyspepsia 03/17/2013   Dysthymia 01/30/2013   Pain syndrome, chronic 01/30/2013   Arthritis of knee 06/21/2011   Pain in left knee 06/21/2011   IBS (irritable bowel syndrome) 03/29/2011   FATIGUE 12/12/2010   DYSPNEA ON EXERTION 12/12/2010   GERD 10/21/2008   Palpitations 10/21/2008   MURMUR 10/21/2008   SUBLUXATION PATELLAR (MALALIGNMENT) 04/01/2008   KNEE PAIN 01/05/2008   BACK PAIN 01/05/2008   Past Medical History:  Diagnosis Date   Arthritis    BMI (body mass index) 20.0-29.9 2008 192 lbs   Cervicalgia    Claustrophobia    Complication of anesthesia    post op N/V   CVA (cerebral vascular accident) (HCC) 06/2020   Depression    Depressive disorder    Diabetes mellitus    type 2   Fibromyalgia    GERD (gastroesophageal reflux disease) 2002   202 lbs   Goiter    Hypercholesteremia    Hypertension    Insomnia    Irritable bowel syndrome 2008   diarrhea predominant MEDS TRIED: BENTYL , LEVBID    LBP (low back pain)    Migraine    PONV (postoperative nausea and vomiting)    PTSD (post-traumatic stress disorder)    RUQ abdominal pain 2004 HIDA NL CTA w/ IVC-FATTY LIVER, ANGIOMYOLIPOMA LEFT KIDNEY   2008  HIDA NL   TIA (transient ischemic attack) 06/2020   Family History  Problem Relation Age of Onset   Anxiety disorder Mother    Stroke Mother    COPD Mother    Hypertension Mother    Thyroid  disease Mother    Diabetes Mother    Seizures Sister    Anxiety disorder Sister    Stroke Sister    Breast cancer Sister    Anxiety disorder Sister    Drug abuse Sister    Early death Sister    Heart disease Sister    Heart  attack Sister    Drug abuse Daughter    Anxiety disorder Daughter    Breast cancer Maternal Aunt    Anxiety disorder Maternal Aunt    Dementia Maternal Aunt    Colon cancer Maternal Aunt        in her 60s   Pancreatic cancer Maternal Aunt        great aunt   Alcohol  abuse Maternal Uncle    Stroke Maternal Grandmother    Mental illness Cousin    Colon polyps Neg Hx    ADD / ADHD Neg Hx    Bipolar disorder Neg Hx    Depression Neg Hx    OCD Neg Hx    Paranoid behavior Neg Hx    Schizophrenia Neg Hx    Sexual abuse Neg Hx    Physical abuse Neg Hx    Past Surgical History:  Procedure Laterality Date   ANKLE SURGERY  RIGHT   BREAST REDUCTION SURGERY  2006   COLONOSCOPY  2008 RMR   PAN-COLONIC TICS, NL TI   COLONOSCOPY  2013   Dr. Donnel: diverticulum in ascending colon, mild diverticulosis in sigmoid. Recommended 5 year surveillance   COLONOSCOPY WITH PROPOFOL  N/A 05/12/2018   Dr. Shaaron: pancolonic diverticulosis, otherwise normal   ESOPHAGEAL DILATION N/A 04/06/2015   Procedure: ESOPHAGEAL DILATION;  Surgeon: Lamar CHRISTELLA Shaaron, MD;  Location: [facility];  Service: Gastroenterology;  Laterality: N/A;  300   ESOPHAGOGASTRODUODENOSCOPY N/A 04/06/2015   RMR: Normal EGD status post Maloney dilation of teh esophagus as described above   ESOPHAGOGASTRODUODENOSCOPY (EGD) WITH PROPOFOL  N/A 05/12/2018   Dr. Shaaron: normal esophagus s/p dilation, normal stomach, normal duodenum   ESOPHAGOGASTRODUODENOSCOPY (EGD) WITH PROPOFOL  N/A 06/27/2022   Procedure:  ESOPHAGOGASTRODUODENOSCOPY (EGD) WITH PROPOFOL ;  Surgeon: Shaaron Lamar CHRISTELLA, MD;  Location: [facility];  Service: Endoscopy;  Laterality: N/A;  11:00am   LAPAROSCOPIC BILATERAL SALPINGO OOPHERECTOMY Bilateral 11/22/2015   Procedure: LAPAROSCOPIC BILATERAL SALPINGO OOPHORECTOMY;  Surgeon: Norleen Edsel GAILS, MD;  Location: [facility];  Service: Gynecology;  Laterality: Bilateral;   LUMBAR DISC SURGERY  2005   x2   MALONEY DILATION N/A 05/12/2018   Procedure: AGAPITO DILATION;  Surgeon: Shaaron Lamar CHRISTELLA, MD;  Location: [facility];  Service: Endoscopy;  Laterality: N/A;   MALONEY DILATION N/A 06/27/2022   Procedure: AGAPITO DILATION;  Surgeon: Shaaron Lamar CHRISTELLA, MD;  Location: [facility];  Service: Endoscopy;  Laterality: N/A;   REDUCTION MAMMAPLASTY     SHOULDER SURGERY  1997 RIGHT   TOTAL ABDOMINAL HYSTERECTOMY  2000 fibroids/DUB   UPPER GASTROINTESTINAL ENDOSCOPY  2008 RMR   [facility] o/w NL   vocal cord polyp removal  2013   Dr. Criss Blush   Social History   Occupational History    Employer: RETIRED    Comment: [employer]  Tobacco Use   Smoking status: Former    Current packs/day: 0.00    Average packs/day: 0.5 packs/day for 2.0 years (1.0 ttl pk-yrs)    Types: Cigarettes    Start date: 06/29/2018    Quit date: 06/29/2020    Years since quitting: 4.3   Smokeless tobacco: Never  Vaping Use   Vaping status: Never Used  Substance and Sexual Activity   Alcohol  use: No    Alcohol /week: 0.0 standard drinks of alcohol    Drug use: No   Sexual activity: Yes    Birth control/protection: Surgical

## 2024-10-13 ENCOUNTER — Ambulatory Visit: Admitting: Physical Medicine and Rehabilitation

## 2024-10-13 DIAGNOSIS — M79641 Pain in right hand: Secondary | ICD-10-CM | POA: Diagnosis not present

## 2024-10-13 DIAGNOSIS — R29898 Other symptoms and signs involving the musculoskeletal system: Secondary | ICD-10-CM

## 2024-10-13 DIAGNOSIS — M25511 Pain in right shoulder: Secondary | ICD-10-CM | POA: Diagnosis not present

## 2024-10-13 DIAGNOSIS — R202 Paresthesia of skin: Secondary | ICD-10-CM

## 2024-10-13 DIAGNOSIS — M79601 Pain in right arm: Secondary | ICD-10-CM | POA: Diagnosis not present

## 2024-10-13 DIAGNOSIS — G8929 Other chronic pain: Secondary | ICD-10-CM

## 2024-10-13 NOTE — Progress Notes (Signed)
 Pain Scale   Average Pain 4 Patient advising she has Right shoulder pain and causing numbness and tingling to right hand with some weakness. Patient is Right hand dominate        +Driver, -BT, -Dye Allergies.

## 2024-10-18 ENCOUNTER — Encounter: Payer: Self-pay | Admitting: Physical Medicine and Rehabilitation

## 2024-10-18 NOTE — Procedures (Signed)
 EMG & NCV Findings: Evaluation of the right median motor nerve showed prolonged distal onset latency (4.5 ms) and decreased conduction velocity (Elbow-Wrist, 46 m/s).  The right median (across palm) sensory nerve showed prolonged distal peak latency (Wrist, 5.0 ms) and prolonged distal peak latency (Palm, 2.6 ms).  All remaining nerves (as indicated in the following tables) were within normal limits.    All examined muscles (as indicated in the following table) showed no evidence of electrical instability.    Impression: The above electrodiagnostic study is ABNORMAL and reveals evidence of a moderate right median nerve entrapment at the wrist (carpal tunnel syndrome) affecting sensory and motor components.   There is no significant electrodiagnostic evidence of any other focal nerve entrapment, brachial plexopathy or cervical radiculopathy.   Recommendations: 1.  Follow-up with referring physician. 2.  Continue current management of symptoms. 3.  Continue use of resting splint at night-time and as needed during the day. 4.  Suggest surgical evaluation.  ___________________________ Prentice Masters FAAPMR Board Certified, American Board of Physical Medicine and Rehabilitation    Nerve Conduction Studies Anti Sensory Summary Table   Stim Site NR Peak (ms) Norm Peak (ms) P-T Amp (V) Norm P-T Amp Site1 Site2 Delta-P (ms) Dist (cm) Vel (m/s) Norm Vel (m/s)  Right Median Acr Palm Anti Sensory (2nd Digit)  29.9C  Wrist    *5.0 <3.6 24.4 >10 Wrist Palm 2.4 0.0    Palm    *2.6 <2.0 0.2         Right Radial Anti Sensory (Base 1st Digit)  30.3C  Wrist    2.1 <3.1 18.8  Wrist Base 1st Digit 2.1 0.0    Right Ulnar Anti Sensory (5th Digit)  30.4C  Wrist    3.3 <3.7 22.3 >15.0 Wrist 5th Digit 3.3 14.0 42 >38   Motor Summary Table   Stim Site NR Onset (ms) Norm Onset (ms) O-P Amp (mV) Norm O-P Amp Site1 Site2 Delta-0 (ms) Dist (cm) Vel (m/s) Norm Vel (m/s)  Right Median Motor (Abd Poll Brev)   30.4C  Wrist    *4.5 <4.2 9.5 >5 Elbow Wrist 5.6 26.0 *46 >50  Elbow    10.1  2.7         Right Ulnar Motor (Abd Dig Min)  30.5C  Wrist    2.8 <4.2 14.2 >3 B Elbow Wrist 4.5 24.0 53 >53  B Elbow    7.3  5.2  A Elbow B Elbow 1.2 10.0 83 >53  A Elbow    8.5  13.1          EMG   Side Muscle Nerve Root Ins Act Fibs Psw Amp Dur Poly Recrt Int Bruna Comment  Right 1stDorInt Ulnar C8-T1 Nml Nml Nml Nml Nml 0 Nml Nml   Right Abd Poll Brev Median C8-T1 Nml Nml Nml Nml Nml 0 Nml Nml   Right ExtDigCom   Nml Nml Nml Nml Nml 0 Nml Nml   Right Triceps Radial C6-7-8 Nml Nml Nml Nml Nml 0 Nml Nml   Right Deltoid Axillary C5-6 Nml Nml Nml Nml Nml 0 Nml Nml     Nerve Conduction Studies Anti Sensory Left/Right Comparison   Stim Site L Lat (ms) R Lat (ms) L-R Lat (ms) L Amp (V) R Amp (V) L-R Amp (%) Site1 Site2 L Vel (m/s) R Vel (m/s) L-R Vel (m/s)  Median Acr Palm Anti Sensory (2nd Digit)  29.9C  Wrist  *5.0   24.4  Wrist Palm  Palm  *2.6   0.2        Radial Anti Sensory (Base 1st Digit)  30.3C  Wrist  2.1   18.8  Wrist Base 1st Digit     Ulnar Anti Sensory (5th Digit)  30.4C  Wrist  3.3   22.3  Wrist 5th Digit  42    Motor Left/Right Comparison   Stim Site L Lat (ms) R Lat (ms) L-R Lat (ms) L Amp (mV) R Amp (mV) L-R Amp (%) Site1 Site2 L Vel (m/s) R Vel (m/s) L-R Vel (m/s)  Median Motor (Abd Poll Brev)  30.4C  Wrist  *4.5   9.5  Elbow Wrist  *46   Elbow  10.1   2.7        Ulnar Motor (Abd Dig Min)  30.5C  Wrist  2.8   14.2  B Elbow Wrist  53   B Elbow  7.3   5.2  A Elbow B Elbow  83   A Elbow  8.5   13.1           Waveforms:

## 2024-11-02 ENCOUNTER — Ambulatory Visit: Admitting: Orthopedic Surgery

## 2024-11-30 ENCOUNTER — Ambulatory Visit: Admitting: Orthopedic Surgery

## 2024-12-10 ENCOUNTER — Ambulatory Visit: Admitting: Orthopedic Surgery

## 2024-12-17 ENCOUNTER — Ambulatory Visit: Admitting: Orthopedic Surgery

## 2024-12-24 ENCOUNTER — Ambulatory Visit: Admitting: Orthopedic Surgery

## 2025-01-04 ENCOUNTER — Ambulatory Visit: Admitting: Orthopedic Surgery
